# Patient Record
Sex: Female | Born: 1955 | Race: White | Hispanic: No | Marital: Married | Smoking: Never smoker
Health system: Southern US, Community
[De-identification: ages and names within clinical notes are randomized; demographics above are authoritative.]

## PROBLEM LIST (undated history)

## (undated) DIAGNOSIS — J45909 Unspecified asthma, uncomplicated: Secondary | ICD-10-CM

## (undated) DIAGNOSIS — E669 Obesity, unspecified: Secondary | ICD-10-CM

## (undated) DIAGNOSIS — I493 Ventricular premature depolarization: Secondary | ICD-10-CM

## (undated) DIAGNOSIS — L570 Actinic keratosis: Secondary | ICD-10-CM

## (undated) DIAGNOSIS — I5189 Other ill-defined heart diseases: Secondary | ICD-10-CM

## (undated) DIAGNOSIS — I471 Supraventricular tachycardia, unspecified: Secondary | ICD-10-CM

## (undated) DIAGNOSIS — E039 Hypothyroidism, unspecified: Secondary | ICD-10-CM

## (undated) DIAGNOSIS — M1990 Unspecified osteoarthritis, unspecified site: Secondary | ICD-10-CM

## (undated) DIAGNOSIS — E785 Hyperlipidemia, unspecified: Secondary | ICD-10-CM

## (undated) DIAGNOSIS — E119 Type 2 diabetes mellitus without complications: Secondary | ICD-10-CM

## (undated) DIAGNOSIS — D649 Anemia, unspecified: Secondary | ICD-10-CM

## (undated) DIAGNOSIS — T753XXA Motion sickness, initial encounter: Secondary | ICD-10-CM

## (undated) DIAGNOSIS — D259 Leiomyoma of uterus, unspecified: Secondary | ICD-10-CM

## (undated) DIAGNOSIS — I10 Essential (primary) hypertension: Secondary | ICD-10-CM

## (undated) DIAGNOSIS — K219 Gastro-esophageal reflux disease without esophagitis: Secondary | ICD-10-CM

## (undated) HISTORY — DX: Essential (primary) hypertension: I10

## (undated) HISTORY — DX: Actinic keratosis: L57.0

## (undated) HISTORY — DX: Other ill-defined heart diseases: I51.89

## (undated) HISTORY — DX: Supraventricular tachycardia, unspecified: I47.10

## (undated) HISTORY — DX: Anemia, unspecified: D64.9

## (undated) HISTORY — DX: Obesity, unspecified: E66.9

## (undated) HISTORY — DX: Type 2 diabetes mellitus without complications: E11.9

## (undated) HISTORY — DX: Ventricular premature depolarization: I49.3

## (undated) HISTORY — DX: Leiomyoma of uterus, unspecified: D25.9

## (undated) HISTORY — DX: Supraventricular tachycardia: I47.1

## (undated) HISTORY — DX: Hyperlipidemia, unspecified: E78.5

## (undated) HISTORY — PX: CARPAL TUNNEL RELEASE: SHX101

## (undated) HISTORY — PX: TONSILLECTOMY AND ADENOIDECTOMY: SUR1326

## (undated) HISTORY — DX: Hypothyroidism, unspecified: E03.9

## (undated) HISTORY — PX: TUBAL LIGATION: SHX77

---
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
## 2006-11-10 HISTORY — PX: CARPAL TUNNEL RELEASE: SHX101

## 2007-03-24 ENCOUNTER — Other Ambulatory Visit: Payer: Self-pay

## 2007-03-24 ENCOUNTER — Emergency Department: Payer: Self-pay | Admitting: Unknown Physician Specialty

## 2007-08-12 LAB — HM COLONOSCOPY: HM Colonoscopy: NORMAL

## 2008-10-23 ENCOUNTER — Ambulatory Visit: Payer: Self-pay | Admitting: Internal Medicine

## 2008-11-07 ENCOUNTER — Ambulatory Visit: Payer: Self-pay | Admitting: Internal Medicine

## 2009-08-03 ENCOUNTER — Emergency Department: Payer: Self-pay | Admitting: Emergency Medicine

## 2009-08-11 LAB — HM PAP SMEAR: HM Pap smear: NORMAL

## 2010-12-13 ENCOUNTER — Other Ambulatory Visit (HOSPITAL_COMMUNITY): Payer: Self-pay | Admitting: Surgery

## 2010-12-13 DIAGNOSIS — R1011 Right upper quadrant pain: Secondary | ICD-10-CM

## 2011-01-06 ENCOUNTER — Other Ambulatory Visit (HOSPITAL_COMMUNITY): Payer: Self-pay

## 2011-04-12 LAB — FECAL OCCULT BLOOD, GUAIAC: Fecal Occult Blood: NEGATIVE

## 2011-07-26 ENCOUNTER — Emergency Department: Payer: Self-pay | Admitting: Emergency Medicine

## 2011-08-01 ENCOUNTER — Encounter: Payer: Self-pay | Admitting: Nurse Practitioner

## 2011-08-01 ENCOUNTER — Telehealth: Payer: Self-pay | Admitting: Cardiology

## 2011-08-01 NOTE — Telephone Encounter (Signed)
Patient calling states she went to er on 9/15. C/O rapid heart rate. Pt saw Dr. Swaziland in the past.

## 2011-08-01 NOTE — Telephone Encounter (Signed)
Called stating she was in ER at [facility] last week for "rapid heart rate". They had to give her Adenosine and Metoprolol to get heart rate down. Her BP this week has been running 150/100; and HR 95-110. Feels bad; tired. Has had physicial w/ Dr. Clelia Croft recently and EKG was normal. Gave her an app to see [facility] Monday. She will call [facility] to see if she can get records ie; EKG and ER note faxed to [place].

## 2011-08-04 ENCOUNTER — Other Ambulatory Visit: Payer: Self-pay | Admitting: *Deleted

## 2011-08-04 ENCOUNTER — Encounter: Payer: Self-pay | Admitting: Nurse Practitioner

## 2011-08-04 ENCOUNTER — Ambulatory Visit (INDEPENDENT_AMBULATORY_CARE_PROVIDER_SITE_OTHER): Payer: BC Managed Care – PPO | Admitting: Nurse Practitioner

## 2011-08-04 ENCOUNTER — Telehealth: Payer: Self-pay | Admitting: Cardiology

## 2011-08-04 DIAGNOSIS — I10 Essential (primary) hypertension: Secondary | ICD-10-CM | POA: Insufficient documentation

## 2011-08-04 DIAGNOSIS — I471 Supraventricular tachycardia, unspecified: Secondary | ICD-10-CM | POA: Insufficient documentation

## 2011-08-04 DIAGNOSIS — E669 Obesity, unspecified: Secondary | ICD-10-CM

## 2011-08-04 DIAGNOSIS — I498 Other specified cardiac arrhythmias: Secondary | ICD-10-CM

## 2011-08-04 MED ORDER — HYDROCHLOROTHIAZIDE 25 MG PO TABS: 25.0000 mg | ORAL_TABLET | Freq: Every day | ORAL | Status: DC

## 2011-08-04 MED ORDER — METOPROLOL SUCCINATE ER 100 MG PO TB24: 100.0000 mg | ORAL_TABLET | Freq: Every day | ORAL | Status: DC

## 2011-08-04 NOTE — Progress Notes (Signed)
    Drue Stager Date of Birth: [birth year]   History of Present Illness: Amy Petty is seen today for a work in visit. She is seen for Dr. Swaziland. She has had a recurrent episode of SVT Saturday one week ago. This occurred in the late evening while watching TV. She felt a little breathless. No dizziness or syncope. No chest pain. She had had her physical several days before. Blood pressure has been up. She was started on very low dose HCTZ but her readings remain high. She went to the ER and was given adenosine x 2 with conversion back to sinus rhythm. She has had no recurrences and now feels ok. There was concern that her thyroid level was elevated but a recheck in Dr. Alver Fisher office showed it to be ok.   Current Outpatient Prescriptions on File Prior to Visit  Medication Sig Dispense Refill  . acetaminophen (TYLENOL) 650 MG CR tablet Take 650 mg by mouth every 8 (eight) hours as needed.        . Multiple Vitamin (MULTIVITAMIN) tablet Take 1 tablet by mouth daily.        . Multiple Vitamins-Minerals (HAIR/SKIN/NAILS PO) Take by mouth daily.        . naproxen (NAPROSYN) 500 MG tablet Take 500 mg by mouth as needed.        Marland Kitchen DISCONTD: metoprolol (TOPROL-XL) 50 MG 24 hr tablet Take 50 mg by mouth daily.        . hydrochlorothiazide (HYDRODIURIL) 25 MG tablet Take 1 tablet (25 mg total) by mouth daily.  30 tablet  6    Allergies  Allergen Reactions  . Penicillins   . Sulfonamide Derivatives     Past Medical History  Diagnosis Date  . Hypertension   . PVC's (premature ventricular contractions)   . SVT (supraventricular tachycardia)   . Obesity   . Hypothyroidism     Past Surgical History  Procedure Date  . Tubal ligation   . Carpal tunnel release   . Tonsillectomy and adenoidectomy     History  Smoking status  . Never Smoker   Smokeless tobacco  . Not on file    History  Alcohol Use No    Family History  Problem Relation Age of Onset  . Cancer Mother   . Lung cancer Mother     . Cancer Father   . Liver cancer Father   . Diabetes Sister     Review of Systems: The review of systems is positive for nosebleeds and elevated blood pressure.  All other systems were reviewed and are negative.  Physical Exam: BP 172/112  Pulse 80  Ht 5\' 9"  (1.753 m)  Wt 272 lb (123.378 kg)  BMI 40.17 kg/m2 Patient is very pleasant and in no acute distress. She is obese. Skin is warm and dry. Color is normal.  HEENT is unremarkable. Normocephalic/atraumatic. PERRL. Sclera are nonicteric. Neck is supple. No masses. No JVD. Lungs are clear. Cardiac exam shows a regular rate and rhythm. Abdomen is obese but soft. Extremities are without edema. Gait and ROM are intact. No gross neurologic deficits noted.  LABORATORY DATA: ER records from [place] reviewed. EKG's with SVT noted.    Assessment / Plan:

## 2011-08-04 NOTE — Assessment & Plan Note (Signed)
Blood pressure is up despite the low dose HCTZ. I have increased both the HCTZ to 25 mg and her metoprolol to 100 mg daily. She will continue to monitor at home. I cautioned her about rash since she does have a sulfa allergy but is tolerating so far. Will see back in 2 weeks. Check echo looking for LVH. Patient is agreeable to this plan and will call if any problems develop in the interim.

## 2011-08-04 NOTE — Telephone Encounter (Signed)
States she called [facility] to get records and they said our office would have to fax request. Sent request. Is scheduled to be seen this afternoon w/Lori.

## 2011-08-04 NOTE — Telephone Encounter (Signed)
Patient calling back with information . Fax  # [phone]. This should be a written request.

## 2011-08-04 NOTE — Patient Instructions (Signed)
Dr. Roxy Manns is at [facility] on [address]  We are going to get an ultrasound of your heart I want you to increase your HCTZ to 25 mg and your Metoprolol to 100 mg daily I will see you in 2 weeks Keep a check of your blood pressure  Minimize your salt

## 2011-08-04 NOTE — Assessment & Plan Note (Signed)
She has had a recurrent episode. This is her third one in the last couple of years. Beta blocker is increased. Echo will be ordered. May need to consider ablation for repeat episodes.

## 2011-08-11 ENCOUNTER — Ambulatory Visit (HOSPITAL_COMMUNITY): Payer: BC Managed Care – PPO | Attending: Cardiology | Admitting: Radiology

## 2011-08-11 DIAGNOSIS — I471 Supraventricular tachycardia, unspecified: Secondary | ICD-10-CM | POA: Insufficient documentation

## 2011-08-11 DIAGNOSIS — I10 Essential (primary) hypertension: Secondary | ICD-10-CM

## 2011-08-12 ENCOUNTER — Encounter: Payer: Self-pay | Admitting: *Deleted

## 2011-08-12 LAB — HM MAMMOGRAPHY: HM Mammogram: NORMAL

## 2011-08-13 ENCOUNTER — Telehealth: Payer: Self-pay | Admitting: *Deleted

## 2011-08-13 NOTE — Telephone Encounter (Signed)
Lm w/echo results. Will send copy to Dr. Clelia Croft

## 2011-08-13 NOTE — Telephone Encounter (Signed)
Message copied by Lorayne Bender on Wed Aug 13, 2011  5:38 PM ------      Message from: Rosalio Macadamia      Created: Tue Aug 12, 2011  2:07 PM       Ok to report. Echo is satisfactory.

## 2011-08-18 ENCOUNTER — Encounter: Payer: Self-pay | Admitting: Nurse Practitioner

## 2011-08-18 ENCOUNTER — Other Ambulatory Visit (INDEPENDENT_AMBULATORY_CARE_PROVIDER_SITE_OTHER): Payer: BC Managed Care – PPO | Admitting: *Deleted

## 2011-08-18 ENCOUNTER — Ambulatory Visit (INDEPENDENT_AMBULATORY_CARE_PROVIDER_SITE_OTHER): Payer: BC Managed Care – PPO | Admitting: Nurse Practitioner

## 2011-08-18 VITALS — BP 128/88 | HR 90 | Ht 70.0 in | Wt 270.1 lb

## 2011-08-18 DIAGNOSIS — I10 Essential (primary) hypertension: Secondary | ICD-10-CM

## 2011-08-18 DIAGNOSIS — I471 Supraventricular tachycardia: Secondary | ICD-10-CM

## 2011-08-18 DIAGNOSIS — E669 Obesity, unspecified: Secondary | ICD-10-CM

## 2011-08-18 DIAGNOSIS — I498 Other specified cardiac arrhythmias: Secondary | ICD-10-CM

## 2011-08-18 NOTE — Progress Notes (Signed)
    Drue Stager Date of Birth: 04-30-1956   History of Present Illness: Ms. Amy Petty is seen today for a follow up visit. It is a 2 week check. She is seen for Dr. Swaziland. She has had a recent episode of SVT. Blood pressure has been trending up. I increased her metoprolol and HCTZ 2 weeks ago. She is doing very well. She is tolerating her medicines. No chest pain or shortness of breath. Blood pressure has trended down nicely at home. No recurrent SVT. She is trying to arrange closer primary care follow up.   Current Outpatient Prescriptions on File Prior to Visit  Medication Sig Dispense Refill  . hydrochlorothiazide (HYDRODIURIL) 25 MG tablet Take 1 tablet (25 mg total) by mouth daily.  30 tablet  6  . metoprolol (TOPROL XL) 100 MG 24 hr tablet Take 1 tablet (100 mg total) by mouth daily.  30 tablet  11  . Multiple Vitamin (MULTIVITAMIN) tablet Take 1 tablet by mouth daily.        . Multiple Vitamins-Minerals (HAIR/SKIN/NAILS PO) Take by mouth daily.        . naproxen (NAPROSYN) 500 MG tablet Take 500 mg by mouth daily.         Allergies  Allergen Reactions  . Penicillins   . Sulfonamide Derivatives     Past Medical History  Diagnosis Date  . Hypertension   . PVC's (premature ventricular contractions)   . SVT (supraventricular tachycardia)   . Obesity   . Hypothyroidism   . Diastolic dysfunction     Grade 1 per echo Oct 2012; EF is normal.    Past Surgical History  Procedure Date  . Tubal ligation   . Carpal tunnel release   . Tonsillectomy and adenoidectomy     History  Smoking status  . Never Smoker   Smokeless tobacco  . Not on file    History  Alcohol Use No    Family History  Problem Relation Age of Onset  . Cancer Mother   . Lung cancer Mother   . Cancer Father   . Liver cancer Father   . Diabetes Sister     Review of Systems: The review of systems is positive for some knee pain.   All other systems were reviewed and are negative.  Physical Exam: BP  128/88  Pulse 90  Ht 5\' 10"  (1.778 m)  Wt 270 lb 1.9 oz (122.526 kg)  BMI 38.76 kg/m2 Patient is very pleasant and in no acute distress. She is obese. Skin is warm and dry. Color is normal.  HEENT is unremarkable. Normocephalic/atraumatic. PERRL. Sclera are nonicteric. Neck is supple. No masses. No JVD. Lungs are clear. Cardiac exam shows a regular rate and rhythm. Abdomen is obese but soft. Extremities are without edema. Gait and ROM are intact. No gross neurologic deficits noted.   LABORATORY DATA: BMET is pending   Assessment / Plan:

## 2011-08-18 NOTE — Patient Instructions (Signed)
Stay on your current medicines. Continue to monitor your blood pressure at home. I will see you in 6 months.  We will see what your lab looks like today.  Stay active. Think about water aerobics.  Call for any problems.

## 2011-08-18 NOTE — Assessment & Plan Note (Signed)
Weight loss is encouraged. I encouraged her to look into a water aerobic exercise class.

## 2011-08-18 NOTE — Assessment & Plan Note (Addendum)
Blood pressure has trended down nicely at home. Echo looks pretty good. Exercise/diet and weight loss are encouraged. We will continue with her current regimen. We will see her back in 6 months. BMET is checked today. She will continue to monitor her blood pressures at home. Patient is agreeable to this plan and will call if any problems develop in the interim.

## 2011-08-18 NOTE — Assessment & Plan Note (Signed)
No recurrence. 

## 2011-08-19 LAB — BASIC METABOLIC PANEL
BUN: 16 mg/dL (ref 6–23)
CO2: 31 mEq/L (ref 19–32)
Calcium: 9.6 mg/dL (ref 8.4–10.5)
Chloride: 100 mEq/L (ref 96–112)
Creatinine, Ser: 1 mg/dL (ref 0.4–1.2)
GFR: 63.5 mL/min (ref >60.00)
Glucose, Bld: 139 mg/dL — ABNORMAL HIGH (ref 70–99)
Potassium: 4.3 mEq/L (ref 3.5–5.1)
Sodium: 139 mEq/L (ref 135–145)

## 2011-08-20 ENCOUNTER — Telehealth: Payer: Self-pay | Admitting: Cardiology

## 2011-08-20 ENCOUNTER — Telehealth: Payer: Self-pay | Admitting: *Deleted

## 2011-08-20 NOTE — Telephone Encounter (Signed)
Pt was told she has a "stiff Heart"  She would like more info/  Please call her back and let her know.  She looked on Internet and got some info.  If no answer, just leave a message.

## 2011-08-20 NOTE — Telephone Encounter (Signed)
Lm w/lab results. Will send labs to Dr. Clelia Croft

## 2011-08-20 NOTE — Telephone Encounter (Signed)
Message copied by Lorayne Bender on Wed Aug 20, 2011 10:18 AM ------      Message from: Rosalio Macadamia      Created: Tue Aug 19, 2011 11:51 AM       Ok to report. Labs are satisfactory. This was nonfasting. Continue with current medicines.

## 2011-08-21 NOTE — Telephone Encounter (Signed)
Per Amy Petty advised that she has dystolic dysfunction grade 1; diet and exercise will help. Pt understands

## 2011-09-19 ENCOUNTER — Ambulatory Visit: Payer: BC Managed Care – PPO | Admitting: Internal Medicine

## 2011-10-15 ENCOUNTER — Ambulatory Visit: Payer: BC Managed Care – PPO | Admitting: Internal Medicine

## 2011-10-20 ENCOUNTER — Encounter: Payer: Self-pay | Admitting: Internal Medicine

## 2011-10-20 ENCOUNTER — Ambulatory Visit (INDEPENDENT_AMBULATORY_CARE_PROVIDER_SITE_OTHER): Payer: BC Managed Care – PPO | Admitting: Internal Medicine

## 2011-10-20 DIAGNOSIS — J302 Other seasonal allergic rhinitis: Secondary | ICD-10-CM

## 2011-10-20 DIAGNOSIS — I471 Supraventricular tachycardia, unspecified: Secondary | ICD-10-CM

## 2011-10-20 DIAGNOSIS — I498 Other specified cardiac arrhythmias: Secondary | ICD-10-CM

## 2011-10-20 DIAGNOSIS — J309 Allergic rhinitis, unspecified: Secondary | ICD-10-CM | POA: Insufficient documentation

## 2011-10-20 DIAGNOSIS — Z1211 Encounter for screening for malignant neoplasm of colon: Secondary | ICD-10-CM

## 2011-10-20 DIAGNOSIS — Z79899 Other long term (current) drug therapy: Secondary | ICD-10-CM

## 2011-10-20 DIAGNOSIS — Z124 Encounter for screening for malignant neoplasm of cervix: Secondary | ICD-10-CM

## 2011-10-20 DIAGNOSIS — D259 Leiomyoma of uterus, unspecified: Secondary | ICD-10-CM | POA: Insufficient documentation

## 2011-10-20 DIAGNOSIS — Z1239 Encounter for other screening for malignant neoplasm of breast: Secondary | ICD-10-CM

## 2011-10-20 DIAGNOSIS — D649 Anemia, unspecified: Secondary | ICD-10-CM

## 2011-10-20 DIAGNOSIS — E039 Hypothyroidism, unspecified: Secondary | ICD-10-CM

## 2011-10-20 MED ORDER — METOPROLOL SUCCINATE ER 100 MG PO TB24: 100.0000 mg | ORAL_TABLET | Freq: Every day | ORAL | Status: DC

## 2011-10-20 MED ORDER — HYDROCHLOROTHIAZIDE 25 MG PO TABS: 25.0000 mg | ORAL_TABLET | Freq: Every day | ORAL | Status: DC

## 2011-10-20 MED ORDER — MOMETASONE FUROATE 50 MCG/ACT NA SUSP: 2.0000 | Freq: Every day | NASAL | Status: DC

## 2011-10-20 MED ORDER — NAPROXEN 500 MG PO TABS: 500.0000 mg | ORAL_TABLET | Freq: Every day | ORAL | Status: DC

## 2011-10-20 NOTE — Progress Notes (Signed)
Subjective:    Patient ID: Amy Petty, female    DOB: 04/05/1956, 55 y.o.   MRN: [id number]  HPI  Amy Petty is a 55 yo white female with a history of allergic rhinitis and hypertension who presents to establish care. She does not treat her allergic rhinitis regularly and as a result has two episodes of bronchitis/sinusitis  annually which usually start with sinus congestion and cough.  She has not had her her episode yet this Fall.  Past Medical History  Diagnosis Date  . Hypertension   . PVC's (premature ventricular contractions)   . SVT (supraventricular tachycardia)   . Obesity   . Hypothyroidism   . Diastolic dysfunction     Grade 1 per echo Oct 2012; EF is normal.  . Fibroid, uterus     with menorrhagia  . Anemia     iron deficiency, due to fibroids   Current Outpatient Prescriptions on File Prior to Visit  Medication Sig Dispense Refill  . Multiple Vitamin (MULTIVITAMIN) tablet Take 1 tablet by mouth daily.        . Multiple Vitamins-Minerals (HAIR/SKIN/NAILS PO) Take by mouth daily.        Marland Kitchen DISCONTD: hydrochlorothiazide (HYDRODIURIL) 25 MG tablet Take 1 tablet (25 mg total) by mouth daily.  30 tablet  6  . DISCONTD: metoprolol (TOPROL XL) 100 MG 24 hr tablet Take 1 tablet (100 mg total) by mouth daily.  30 tablet  11  . DISCONTD: naproxen (NAPROSYN) 500 MG tablet Take 500 mg by mouth daily.         Review of Systems  Constitutional: Negative for fever, chills and unexpected weight change.  HENT: Negative for hearing loss, ear pain, nosebleeds, congestion, sore throat, facial swelling, rhinorrhea, sneezing, mouth sores, trouble swallowing, neck pain, neck stiffness, voice change, postnasal drip, sinus pressure, tinnitus and ear discharge.   Eyes: Negative for pain, discharge, redness and visual disturbance.  Respiratory: Negative for cough, chest tightness, shortness of breath, wheezing and stridor.   Cardiovascular: Negative for chest pain, palpitations and leg swelling.    Musculoskeletal: Negative for myalgias and arthralgias.  Skin: Negative for color change and rash.  Neurological: Negative for dizziness, weakness, light-headedness and headaches.  Hematological: Negative for adenopathy.   BP 128/74  Pulse 85  Temp(Src) 97.9 F (36.6 C) (Oral)  Resp 16  Ht 5' 8.5" (1.74 m)  Wt 269 lb 8 oz (122.244 kg)  BMI 40.38 kg/m2  SpO2 96%  LMP 10/19/2011     Objective:   Physical Exam  Constitutional: She is oriented to person, place, and time. She appears well-developed and well-nourished.  HENT:  Mouth/Throat: Oropharynx is clear and moist.  Eyes: EOM are normal. Pupils are equal, round, and reactive to light. No scleral icterus.  Neck: Normal range of motion. Neck supple. No JVD present. No thyromegaly present.  Cardiovascular: Normal rate, regular rhythm, normal heart sounds and intact distal pulses.   Pulmonary/Chest: Effort normal and breath sounds normal.  Abdominal: Soft. Bowel sounds are normal. She exhibits no mass. There is no tenderness.  Musculoskeletal: Normal range of motion. She exhibits no edema.  Lymphadenopathy:    She has no cervical adenopathy.  Neurological: She is alert and oriented to person, place, and time.  Skin: Skin is warm and dry.  Psychiatric: She has a normal mood and affect.      Assessment & Plan:   SVT (supraventricular tachycardia) Last  Episode was a few months ago., requiring ER visit and IV meds.  Thyroid appeared underactive but on recheck was normal.   Screening for cervical cancer She will be due for PAP in 6 months  Screening for colon cancer screened with normal colonoscopy at age 88.  Will start annual FOBTs next year.   Screening for breast cancer Normal mammogram [facility] ,  ordered by prior PCP Amy Petty   Anemia iron deficiency,secondary to menorrhagia  due to fibroids.  She takes iron supplements only during the week of her menses..  Will recheck iron stores and hgb prior to next visit.      Updated Medication List Outpatient Encounter Prescriptions as of 10/20/2011  Medication Sig Dispense Refill  . ferrous fumarate (HEMOCYTE - 106 MG FE) 325 (106 FE) MG TABS Take 1 tablet by mouth.        . hydrochlorothiazide (HYDRODIURIL) 25 MG tablet Take 1 tablet (25 mg total) by mouth daily.  90 tablet  3  . metoprolol (TOPROL XL) 100 MG 24 hr tablet Take 1 tablet (100 mg total) by mouth daily.  90 tablet  3  . Multiple Vitamin (MULTIVITAMIN) tablet Take 1 tablet by mouth daily.        . Multiple Vitamins-Minerals (HAIR/SKIN/NAILS PO) Take by mouth daily.        . naproxen (NAPROSYN) 500 MG tablet Take 1 tablet (500 mg total) by mouth daily.  90 tablet  3  . pseudoephedrine-guaifenesin (MUCINEX D) 60-600 MG per tablet Take 1 tablet by mouth every 12 (twelve) hours.        Marland Kitchen DISCONTD: hydrochlorothiazide (HYDRODIURIL) 25 MG tablet Take 1 tablet (25 mg total) by mouth daily.  30 tablet  6  . DISCONTD: hydrochlorothiazide (HYDRODIURIL) 25 MG tablet Take 1 tablet (25 mg total) by mouth daily.  30 tablet  6  . DISCONTD: metoprolol (TOPROL XL) 100 MG 24 hr tablet Take 1 tablet (100 mg total) by mouth daily.  30 tablet  11  . DISCONTD: metoprolol (TOPROL XL) 100 MG 24 hr tablet Take 1 tablet (100 mg total) by mouth daily.  30 tablet  6  . DISCONTD: naproxen (NAPROSYN) 500 MG tablet Take 500 mg by mouth daily.       . mometasone (NASONEX) 50 MCG/ACT nasal spray Place 2 sprays into the nose daily.  17 g  2

## 2011-10-20 NOTE — Assessment & Plan Note (Signed)
Last  Episode was a few months ago., requiring ER visit and IV meds.  Thyroid appeared underactive but on recheck was normal.

## 2011-10-20 NOTE — Assessment & Plan Note (Addendum)
iron deficiency,secondary to menorrhagia  due to fibroids.  She takes iron supplements only during the week of her menses..  Will recheck iron stores and hgb prior to next visit.

## 2011-10-20 NOTE — Assessment & Plan Note (Signed)
She will be due for PAP in 6 months

## 2011-10-20 NOTE — Assessment & Plan Note (Signed)
Recommended trial of steroid nasal spray to avoid recurrent sinusitis/bronchitis precipitated by congestion

## 2011-10-20 NOTE — Assessment & Plan Note (Signed)
Normal mammogram  [facility] ,  ordered by prior PCP Sherryll Burger

## 2011-10-20 NOTE — Assessment & Plan Note (Signed)
screened with normal colonoscopy at age 55.  Will start annual FOBTs next year.

## 2011-11-26 ENCOUNTER — Telehealth: Payer: Self-pay | Admitting: Internal Medicine

## 2011-11-26 NOTE — Telephone Encounter (Signed)
Advised pt.  Advised her to call back if this happens again.

## 2011-11-26 NOTE — Telephone Encounter (Signed)
Patient has been on her menstrual cycle since Sun.She woke up yesterday morning experiencing pain on her left with this pain she would vomit ,after she vomits  the pain would go away. She had this episode twice yesterday morning she isn't having the pain any more just sore from vomiting yesterday.

## 2011-11-26 NOTE — Telephone Encounter (Signed)
Pt has had pain in lower left side since yesterday morning.   This was shooting pains.  No pain today, as note below says, but still sore from vomiting.  No fever, no diarrhea.  She wonders if she could have had a cyst that ruptured.

## 2011-11-26 NOTE — Telephone Encounter (Signed)
Yes it is possible that she had a cyst that ruptured especially if the pain has now resolved

## 2012-03-05 ENCOUNTER — Encounter: Payer: Self-pay | Admitting: Internal Medicine

## 2012-03-05 ENCOUNTER — Ambulatory Visit (INDEPENDENT_AMBULATORY_CARE_PROVIDER_SITE_OTHER)
Admission: RE | Admit: 2012-03-05 | Discharge: 2012-03-05 | Disposition: A | Discharge status: Home / Self Care | Payer: BC Managed Care – PPO | Source: Ambulatory Visit | Attending: Internal Medicine | Admitting: Internal Medicine

## 2012-03-05 ENCOUNTER — Telehealth: Payer: Self-pay | Admitting: Internal Medicine

## 2012-03-05 ENCOUNTER — Ambulatory Visit (INDEPENDENT_AMBULATORY_CARE_PROVIDER_SITE_OTHER): Payer: BC Managed Care – PPO | Admitting: Internal Medicine

## 2012-03-05 VITALS — BP 114/72 | HR 71 | Ht 68.5 in | Wt 257.0 lb

## 2012-03-05 DIAGNOSIS — M25571 Pain in right ankle and joints of right foot: Secondary | ICD-10-CM

## 2012-03-05 DIAGNOSIS — M7661 Achilles tendinitis, right leg: Secondary | ICD-10-CM | POA: Insufficient documentation

## 2012-03-05 DIAGNOSIS — M25579 Pain in unspecified ankle and joints of unspecified foot: Secondary | ICD-10-CM

## 2012-03-05 LAB — COMPREHENSIVE METABOLIC PANEL
AST: 19 U/L (ref 0–37)
BUN: 19 mg/dL (ref 6–23)
Calcium: 9.7 mg/dL (ref 8.4–10.5)
Chloride: 101 mEq/L (ref 96–112)
Creatinine, Ser: 0.7 mg/dL (ref 0.4–1.2)
Total Bilirubin: 0.5 mg/dL (ref 0.3–1.2)

## 2012-03-05 NOTE — Telephone Encounter (Signed)
Caller: [facility]/Patient; PCP: Duncan Dull; CB#: [phone]; Call regarding R Heel Pain; Onset 1 yr; Burning pain posterior heel > in past month d/t > walking.   Pain rated 9/10 at night and 4/10 during day. Afebrile. No known injury. Pain > when palpated.  Pain lessened with Naproxyn. LMP 2/end/13.  Advised to see MD within 24 hrs for new onset of moderate pain that has not improved with 24 hrs of home care per Foot Non-Injury Guideline. Dr Darrick Huntsman has no appts remaining.  Declined appt with Dr Dan Humphreys at 1030 d/t work .  Requests appt after 1230.  Office nurse/Stephanie approved scheduled at 1315 with Dr Dan Humphreys.

## 2012-03-05 NOTE — Progress Notes (Signed)
  Subjective:    Patient ID: Amy Petty, female    DOB: June 11, 1956, 56 y.o.   MRN: [id number]  HPI 56 year old female presents for acute visit complaining of right medial ankle pain. This pain has been present for approximately one year. It has recently worsen. It is described as focal pain over the right medial ankle. It is not consistent with patient's previous history of plantar fasciitis. Patient reports pain with minimal pressure over the area. She has been unable to wear her tennis shoes. She denies any known injury to this site. She is not currently taking any medication for pain.  Outpatient Encounter Prescriptions as of 03/05/2012  Medication Sig Dispense Refill  . ferrous fumarate (HEMOCYTE - 106 MG FE) 325 (106 FE) MG TABS Take 1 tablet by mouth. With menses      . hydrochlorothiazide (HYDRODIURIL) 25 MG tablet Take 1 tablet (25 mg total) by mouth daily.  90 tablet  3  . metoprolol (TOPROL XL) 100 MG 24 hr tablet Take 1 tablet (100 mg total) by mouth daily.  90 tablet  3  . mometasone (NASONEX) 50 MCG/ACT nasal spray Place 2 sprays into the nose daily.  17 g  2  . Multiple Vitamin (MULTIVITAMIN) tablet Take 1 tablet by mouth daily.        . Multiple Vitamins-Minerals (HAIR/SKIN/NAILS PO) Take by mouth daily.        . naproxen (NAPROSYN) 500 MG tablet Take 1 tablet (500 mg total) by mouth daily.  90 tablet  3  . pseudoephedrine-guaifenesin (MUCINEX D) 60-600 MG per tablet Take 1 tablet by mouth every 12 (twelve) hours.         BP 114/72  Pulse 71  Ht 5' 8.5" (1.74 m)  Wt 257 lb (116.574 kg)  BMI 38.51 kg/m2  LMP 01/16/2012  Review of Systems  Constitutional: Negative for fever and chills.  Respiratory: Negative for shortness of breath.   Cardiovascular: Negative for chest pain.  Musculoskeletal: Positive for arthralgias.       Objective:   Physical Exam  Constitutional: She appears well-developed and well-nourished. No distress.  HENT:  Head: Normocephalic and atraumatic.    Eyes: EOM are normal.  Neck: Normal range of motion.  Pulmonary/Chest: Effort normal.  Musculoskeletal:       Right ankle: She exhibits normal range of motion, no swelling, no ecchymosis and no deformity. Achilles tendon normal.       Feet:  Skin: She is not diaphoretic.          Assessment & Plan:

## 2012-03-05 NOTE — Assessment & Plan Note (Signed)
Unclear etiology of right ankle pain. Symptoms are not consistent with plantar fasciitis or other common causes of ankle pain such as tendonitis. Given that pain is so focal and superficial, question if she may have gout. She is on hydrochlorothiazide to have some risk for this. Will send CMP and uric acid. Will get plain x-ray of the ankle to evaluate for trauma or possible foreign body. If x-ray and labs are normal, will set up with orthopedics for evaluation.

## 2012-03-08 ENCOUNTER — Telehealth: Payer: Self-pay | Admitting: *Deleted

## 2012-03-08 ENCOUNTER — Telehealth: Payer: Self-pay | Admitting: Internal Medicine

## 2012-03-08 NOTE — Telephone Encounter (Signed)
LMOM to inform patient/SLS 

## 2012-03-08 NOTE — Telephone Encounter (Signed)
Message copied by Regis Bill on Mon Mar 08, 2012  8:35 AM ------      Message from: Ronna Polio A      Created: Fri Mar 05, 2012  5:14 PM       Labs including uric acid was normal.

## 2012-03-08 NOTE — Telephone Encounter (Signed)
APPOINTMENT WITH DR [facility] ortho  [phone]   Thursday 03/11/12 @ 3:30 Pt aware of appointmnet

## 2012-04-19 ENCOUNTER — Encounter: Payer: Self-pay | Admitting: Internal Medicine

## 2012-04-19 ENCOUNTER — Ambulatory Visit (INDEPENDENT_AMBULATORY_CARE_PROVIDER_SITE_OTHER): Payer: BC Managed Care – PPO | Admitting: Internal Medicine

## 2012-04-19 VITALS — BP 126/74 | HR 64 | Temp 98.0°F | Resp 16 | Wt 260.0 lb

## 2012-04-19 DIAGNOSIS — N951 Menopausal and female climacteric states: Secondary | ICD-10-CM

## 2012-04-19 DIAGNOSIS — E669 Obesity, unspecified: Secondary | ICD-10-CM

## 2012-04-19 DIAGNOSIS — M7661 Achilles tendinitis, right leg: Secondary | ICD-10-CM

## 2012-04-19 DIAGNOSIS — D649 Anemia, unspecified: Secondary | ICD-10-CM

## 2012-04-19 DIAGNOSIS — Z79899 Other long term (current) drug therapy: Secondary | ICD-10-CM

## 2012-04-19 DIAGNOSIS — M7660 Achilles tendinitis, unspecified leg: Secondary | ICD-10-CM

## 2012-04-19 DIAGNOSIS — N920 Excessive and frequent menstruation with regular cycle: Secondary | ICD-10-CM

## 2012-04-19 DIAGNOSIS — I10 Essential (primary) hypertension: Secondary | ICD-10-CM

## 2012-04-19 LAB — COMPREHENSIVE METABOLIC PANEL
Albumin: 3.7 g/dL (ref 3.5–5.2)
Alkaline Phosphatase: 80 U/L (ref 39–117)
BUN: 19 mg/dL (ref 6–23)
CO2: 28 mEq/L (ref 19–32)
GFR: 106.17 mL/min (ref >60.00)
Glucose, Bld: 81 mg/dL (ref 70–99)
Potassium: 4.3 mEq/L (ref 3.5–5.1)
Total Protein: 7.2 g/dL (ref 6.0–8.3)

## 2012-04-19 LAB — CBC WITH DIFFERENTIAL/PLATELET
Eosinophils Absolute: 0.1 10*3/uL (ref 0.0–0.7)
HCT: 36.8 % (ref 36.0–46.0)
Lymphs Abs: 2.5 10*3/uL (ref 0.7–4.0)
MCHC: 32.3 g/dL (ref 30.0–36.0)
MCV: 88.7 fl (ref 78.0–100.0)
Monocytes Absolute: 0.4 10*3/uL (ref 0.1–1.0)
Neutrophils Relative %: 57.7 % (ref 43.0–77.0)
Platelets: 281 10*3/uL (ref 150.0–400.0)

## 2012-04-19 LAB — FOLLICLE STIMULATING HORMONE: FSH: 15 m[IU]/mL

## 2012-04-19 MED ORDER — METOPROLOL SUCCINATE ER 100 MG PO TB24: 100.0000 mg | ORAL_TABLET | Freq: Every day | ORAL | Status: DC

## 2012-04-19 MED ORDER — TRAMADOL HCL 50 MG PO TABS: 50.0000 mg | ORAL_TABLET | Freq: Three times a day (TID) | ORAL | Status: AC | PRN

## 2012-04-19 MED ORDER — HYDROCHLOROTHIAZIDE 25 MG PO TABS: 25.0000 mg | ORAL_TABLET | Freq: Every day | ORAL | Status: DC

## 2012-04-19 NOTE — Assessment & Plan Note (Addendum)
Continue Atkins protein bars, EAS shakes, weight watchers program.  Exericise alternatives to walking given.  She has lost  12 lb since sept

## 2012-04-19 NOTE — Assessment & Plan Note (Signed)
Secondary to menorrhagia.,  No longer taking irone,.  Needs repeat cbcs

## 2012-04-19 NOTE — Progress Notes (Signed)
Patient ID: Amy Petty, female   DOB: 15-Jun-1956, 56 y.o.   MRN: [id number]  Patient Active Problem List  Diagnoses  . SVT (supraventricular tachycardia)  . HTN (hypertension)  . Obesity  . Fibroid, uterus  . Anemia  . Screening for cervical cancer  . Screening for breast cancer  . Screening for colon cancer  . Allergic rhinitis  . Tendonitis, Achilles, right  . Peri-menopause  . Menorrhagia    Subjective:  CC:   Chief Complaint  Patient presents with  . Follow-up    6 month    HPI:   Amy Petty a 56 y.o. female who presents for follow up on chronic and acute issues.  She has severe achilles tendonitis diagnosed by Orthopedics  (Dr. Rayburn Ma ,  [facility], who treated her right wrist with carpal tunnel release previously) after 4/26 evaluation by Dr. Dan Humphreys.  She has had no appreciable improvement with  PT and has been applying  topical voltaren for the past 3 weeks. The next treatment plan is an intraligamentous injection and immobilization in a boot.  The precipitant was increased walking for exercise.  She has been steadily losing weight through diet and exercise until this injury occurred.  2nd issue is chronic menorrhagia.  She continues to have heavy periods but they are now occurring less frequently several months apart, without symptoms such as hot flashes.   Past Medical History  Diagnosis Date  . Hypertension   . PVC's (premature ventricular contractions)   . SVT (supraventricular tachycardia)   . Obesity   . Hypothyroidism   . Diastolic dysfunction     Grade 1 per echo Oct 2012; EF is normal.  . Fibroid, uterus     with menorrhagia  . Anemia     iron deficiency, due to fibroids    Past Surgical History  Procedure Date  . Tubal ligation   . Carpal tunnel release   . Tonsillectomy and adenoidectomy   . Carpal tunnel release 2008    right hand. wears  brace on left         The following portions of the patient's history were reviewed and  updated as appropriate: Allergies, current medications, and problem list.    Review of Systems:   12 Pt  review of systems was negative except those addressed in the HPI,     History   Social History  . Marital Status: Married    Spouse Name: N/A    Number of Children: N/A  . Years of Education: N/A   Occupational History  . Not on file.   Social History Main Topics  . Smoking status: Never Smoker   . Smokeless tobacco: Never Used  . Alcohol Use: No  . Drug Use: No  . Sexually Active: Yes   Other Topics Concern  . Not on file   Social History Narrative  . No narrative on file    Objective:  BP 126/74  Pulse 64  Temp(Src) 98 F (36.7 C) (Oral)  Resp 16  Wt 260 lb (117.935 kg)  SpO2 95%  LMP 03/29/2012  General appearance: alert, cooperative and appears stated age Ears: normal TM's and external ear canals both ears Throat: lips, mucosa, and tongue normal; teeth and gums normal Neck: no adenopathy, no carotid bruit, supple, symmetrical, trachea midline and thyroid not enlarged, symmetric, no tenderness/mass/nodules Back: symmetric, no curvature. ROM normal. No CVA tenderness. Lungs: clear to auscultation bilaterally Heart: regular rate and rhythm, S1, S2 normal, no murmur, click,  rub or gallop Abdomen: soft, non-tender; bowel sounds normal; no masses,  no organomegaly Pulses: 2+ and symmetric Skin: Skin color, texture, turgor normal. No rashes or lesions Lymph nodes: Cervical, supraclavicular, and axillary nodes normal.  Assessment and Plan:  Anemia Secondary to menorrhagia.,  No longer taking irone,.  Needs repeat cbcs   Obesity Continue Atkins protein bars, EAS shakes, weight watchers program.  Exericise alternatives to walking given.  She has lost  12 lb since sept   Menorrhagia Chronic, improving as she is becoming perimenopausal.  Checking  iron stores and FSH/LH  Tendonitis, Achilles, right Secondary to increased walking program.  Not  responsive to PT and topical voltaren. Awaiting steroid injection as next course of treatment.  Recommended walking in swimming pool or water aerobics for exercise. Adding tramadol for pain control .  HTN (hypertension) Well controlled on current medications.  No changes today.    Updated Medication List Outpatient Encounter Prescriptions as of 04/19/2012  Medication Sig Dispense Refill  . hydrochlorothiazide (HYDRODIURIL) 25 MG tablet Take 1 tablet (25 mg total) by mouth daily.  90 tablet  3  . metoprolol succinate (TOPROL XL) 100 MG 24 hr tablet Take 1 tablet (100 mg total) by mouth daily.  90 tablet  3  . Multiple Vitamin (MULTIVITAMIN) tablet Take 1 tablet by mouth daily.        . Multiple Vitamins-Minerals (HAIR/SKIN/NAILS PO) Take by mouth daily.        . naproxen (NAPROSYN) 500 MG tablet Take 1 tablet (500 mg total) by mouth daily.  90 tablet  3  . DISCONTD: hydrochlorothiazide (HYDRODIURIL) 25 MG tablet Take 1 tablet (25 mg total) by mouth daily.  90 tablet  3  . DISCONTD: metoprolol (TOPROL XL) 100 MG 24 hr tablet Take 1 tablet (100 mg total) by mouth daily.  90 tablet  3  . mometasone (NASONEX) 50 MCG/ACT nasal spray Place 2 sprays into the nose daily.  17 g  2  . pseudoephedrine-guaifenesin (MUCINEX D) 60-600 MG per tablet Take 1 tablet by mouth every 12 (twelve) hours.        . traMADol (ULTRAM) 50 MG tablet Take 1 tablet (50 mg total) by mouth every 8 (eight) hours as needed for pain.  90 tablet  3  . DISCONTD: ferrous fumarate (HEMOCYTE - 106 MG FE) 325 (106 FE) MG TABS Take 1 tablet by mouth. With menses         Orders Placed This Encounter  Procedures  . Follicle stimulating hormone  . Luteinizing hormone    No Follow-up on file.

## 2012-04-19 NOTE — Assessment & Plan Note (Signed)
Secondary to increased walking program.  Not responsive to PT and topical voltaren. Awaiting steroid injection as next course of treatment.  Recommended walking in swimming pool or water aerobics for exercise. Adding tramadol for pain control .

## 2012-04-19 NOTE — Assessment & Plan Note (Signed)
Well controlled on current medications.  No changes today. 

## 2012-04-19 NOTE — Patient Instructions (Signed)
You may use tramadol with tylenol and with your topical voltaren bc it is a pure pain reliever. Not an ani inflammatory.   Consider walking in the pool for exercise that will stress your achilles.   We will check nonfasting labs today   .Consider the Low Glycemic Index Diet and 6 smaller meals daily .  This boosts your metabolism and regulates your sugars: ----------------------------------------------------------------------------------------------------------------------------------------------------------------------------------  7 AM Low carbohydrate Protein  Shakes (EAS Carb Control  Or Atkins ,  Available everywhere,   In  cases at [employer] )  2.5 carbs  (Add or substitute a toasted sandwhich thin w/ peanut butter)  10 AM: Protein bar by Atkins (snack size,  Chocolate lover's variety at  [employer])    Lunch: sandwich on pita bread or flatbread (Joseph's makes a pita bread and a flat bread , available at [employer] and [employer]; Toufayah makes a low carb flatbread available at [employer] and [employer]) Mission makes a low carb whole wheat tortilla available at [employer] most grocery stores   3 PM:  Mid day :  Another protein bar,  Or a  cheese stick, 1/4 cup of almonds, walnuts, pistachios, pecans, peanuts,  Macadamia nuts  6 PM  Dinner:  "mean and green:"  Meat/chicken/fish, salad, and green veggie : use ranch, vinagrette,  Blue cheese, etc  9 PM snack : Breyer's low carb fudgsicle or  ice cream bar (Carb Smart), or  Weight Watcher's ice cream bar , or another protein shake

## 2012-04-19 NOTE — Assessment & Plan Note (Signed)
Chronic, improving as she is becoming perimenopausal.  Checking  iron stores and FSH/LH

## 2012-04-20 LAB — IRON AND TIBC
Iron: 56 ug/dL (ref 42–145)
UIBC: 340 ug/dL (ref 125–400)

## 2012-04-22 ENCOUNTER — Other Ambulatory Visit (INDEPENDENT_AMBULATORY_CARE_PROVIDER_SITE_OTHER): Payer: BC Managed Care – PPO | Admitting: *Deleted

## 2012-04-22 ENCOUNTER — Telehealth: Payer: Self-pay | Admitting: Internal Medicine

## 2012-04-22 DIAGNOSIS — N390 Urinary tract infection, site not specified: Secondary | ICD-10-CM

## 2012-04-22 NOTE — Telephone Encounter (Signed)
Tried to review the UA but I can't pull it up.  can you call her in ciprofloxacin 250 mg one tablet twice daily  #10 no refills

## 2012-04-22 NOTE — Telephone Encounter (Signed)
Amy Petty  Provider- Darrick Huntsman;  Pt c/o of urinary frequency and some burning. Onset- 04/20/12 Afebrile. Emergent s/s of Urinary s/s protocol r/o. Pt to see provider within 24 hrs. No appts available and pt states she was just in office on Monday. Per Zella Ball pt can come by to leave a urine sample, call transferred to office.

## 2012-04-23 ENCOUNTER — Other Ambulatory Visit: Payer: Self-pay | Admitting: Internal Medicine

## 2012-04-23 MED ORDER — CIPROFLOXACIN HCL 250 MG PO TABS: 250.0000 mg | ORAL_TABLET | Freq: Two times a day (BID) | ORAL | Status: AC

## 2012-04-23 NOTE — Telephone Encounter (Signed)
Patient notified, Rx called in. 

## 2012-04-24 LAB — URINALYSIS
Bilirubin Urine: NEGATIVE
Glucose, UA: NEGATIVE mg/dL
Hgb urine dipstick: NEGATIVE
Ketones, ur: NEGATIVE mg/dL
Leukocytes, UA: NEGATIVE
Protein, ur: NEGATIVE mg/dL
pH: 5.5 (ref 5.0–8.0)

## 2012-04-25 LAB — URINE CULTURE: Colony Count: 75000

## 2012-05-21 ENCOUNTER — Ambulatory Visit (INDEPENDENT_AMBULATORY_CARE_PROVIDER_SITE_OTHER): Payer: BC Managed Care – PPO | Admitting: Internal Medicine

## 2012-05-21 ENCOUNTER — Telehealth: Payer: Self-pay | Admitting: Internal Medicine

## 2012-05-21 DIAGNOSIS — R300 Dysuria: Secondary | ICD-10-CM

## 2012-05-21 DIAGNOSIS — N390 Urinary tract infection, site not specified: Secondary | ICD-10-CM

## 2012-05-21 LAB — POCT URINALYSIS DIPSTICK
Glucose, UA: NEGATIVE
Leukocytes, UA: NEGATIVE
Nitrite, UA: NEGATIVE
Spec Grav, UA: 1.015
Urobilinogen, UA: 0.2

## 2012-05-21 NOTE — Telephone Encounter (Signed)
Caller: Amy Petty/Patient; PCP: Duncan Dull; CB#: [phone]; ; ; Call regarding Urinary Pain/Bleeding; Onset 05/21/12 am began having burning w/ urination, sharp pains in lower back and side, frequency, urgency. Afebrile. Seen in office for u/a which was neg. Still with low back pain, dysuria and urgency. Drinking plenty of flds, water and orange juice. All emergent s/s r/o per Flank Pain protocol with the exception of "Flank pain or low back pain and urinary tract symptoms" Care advice given. RN instructed to be evaluated in UC within 4 hours.

## 2012-05-21 NOTE — Telephone Encounter (Signed)
Caller: Kelena/Patient; PCP: Duncan Dull; CB#: [phone] or [phone]; Call regarding back pain; sx started 05/21/12; and feels like she has a bladder infection; having urgency sensation; burning with urination; no fever; Triaged per Urinary Symptoms-Female Guideline; See in 4 hr d/t urinary tract symptoms and low back pain; all appt full; office called; appt made for 2:30pm today; will comply

## 2012-05-21 NOTE — Telephone Encounter (Signed)
Will address during appt today.

## 2012-05-23 ENCOUNTER — Encounter: Payer: Self-pay | Admitting: Internal Medicine

## 2012-05-23 DIAGNOSIS — R300 Dysuria: Secondary | ICD-10-CM | POA: Insufficient documentation

## 2012-05-23 LAB — URINE CULTURE: Colony Count: 10000

## 2012-05-23 NOTE — Assessment & Plan Note (Signed)
Her UA and culture were negative for signs of infection or inflammation. She later returned for pelvic evaluation.

## 2012-05-23 NOTE — Progress Notes (Signed)
Patient ID: Amy Petty, female   DOB: 1956/04/16, 56 y.o.   MRN: [id number]  Patient Active Problem List  Diagnosis  . SVT (supraventricular tachycardia)  . HTN (hypertension)  . Obesity  . Fibroid, uterus  . Anemia  . Screening for cervical cancer  . Screening for breast cancer  . Screening for colon cancer  . Allergic rhinitis  . Tendonitis, Achilles, right  . Peri-menopause  . Menorrhagia  . Dysuria    Subjective:  CC:   No chief complaint on file.   HPI:   Amy Petty a 56 y.o. female who presents to drop off a urine sample due to symptoms of low back pain frequency and burning. She did not stay for M.D. evaluation. Urinalysis was reviewed.   Past Medical History  Diagnosis Date  . Hypertension   . PVC's (premature ventricular contractions)   . SVT (supraventricular tachycardia)   . Obesity   . Hypothyroidism   . Diastolic dysfunction     Grade 1 per echo Oct 2012; EF is normal.  . Fibroid, uterus     with menorrhagia  . Anemia     iron deficiency, due to fibroids    Past Surgical History  Procedure Date  . Tubal ligation   . Carpal tunnel release   . Tonsillectomy and adenoidectomy   . Carpal tunnel release 2008    right hand. wears  brace on left    The following portions of the patient's history were reviewed and updated as appropriate: Allergies, current medications, and problem list.    Review of Systems: Pertinent positives per HPI. No other ROS done    History   Social History  . Marital Status: Married    Spouse Name: N/A    Number of Children: N/A  . Years of Education: N/A   Occupational History  . Not on file.   Social History Main Topics  . Smoking status: Never Smoker   . Smokeless tobacco: Never Used  . Alcohol Use: No  . Drug Use: No  . Sexually Active: Yes   Other Topics Concern  . Not on file   Social History Narrative  . No narrative on file    Objective:  There were no vitals taken for this  visit.  Assessment and Plan:  Dysuria Her UA and culture were negative for signs of infection or inflammation. She later returned for pelvic evaluation.   Updated Medication List Outpatient Encounter Prescriptions as of 05/21/2012  Medication Sig Dispense Refill  . hydrochlorothiazide (HYDRODIURIL) 25 MG tablet Take 1 tablet (25 mg total) by mouth daily.  90 tablet  3  . metoprolol succinate (TOPROL XL) 100 MG 24 hr tablet Take 1 tablet (100 mg total) by mouth daily.  90 tablet  3  . mometasone (NASONEX) 50 MCG/ACT nasal spray Place 2 sprays into the nose daily.  17 g  2  . Multiple Vitamin (MULTIVITAMIN) tablet Take 1 tablet by mouth daily.        . Multiple Vitamins-Minerals (HAIR/SKIN/NAILS PO) Take by mouth daily.        . naproxen (NAPROSYN) 500 MG tablet Take 1 tablet (500 mg total) by mouth daily.  90 tablet  3  . pseudoephedrine-guaifenesin (MUCINEX D) 60-600 MG per tablet Take 1 tablet by mouth every 12 (twelve) hours.           Orders Placed This Encounter  Procedures  . Urine culture  . POCT Urinalysis Dipstick    No Follow-up on file.

## 2012-05-24 NOTE — Telephone Encounter (Signed)
Left message on machine at home for patient to return call. 

## 2012-05-24 NOTE — Telephone Encounter (Signed)
With a normal UA she may need to have a CT to rule out kidney stones,  So definitely needs evaluation .  Either UC or ER if she can't be seen here

## 2012-05-25 NOTE — Telephone Encounter (Signed)
Patient advised as instructed via telephone, she stated that this was last week and didn't know why I was just now calling her back.  Advised patient that I was just doing what Dr. Darrick Huntsman requested of me and I really didn't know the situation.  She stated that if she feels worse or symptoms change she will let us know.

## 2012-06-24 ENCOUNTER — Telehealth: Payer: Self-pay | Admitting: Internal Medicine

## 2012-06-24 NOTE — Telephone Encounter (Signed)
Caller: Yurika/Patient; Patient Name: Amy Petty; PCP: Duncan Dull; Best Callback Phone Number: [phone].  Pt states that she went to the office 2 weeks ago for same symptoms; pt gave urine specimen and went home.  PT has been giving a prescription for Cipro and she has been taking the medicine for 3 days but it is not helping.  She still has urgency and a burning sensation when she goes.  All emergent symptoms of Urinary Symptoms - Female Protocol ruled out with exception to 'Evaluated by provider and symptoms worsening after following recommended treatment plan for 72 hours'.  Home care advice given and appt scheduled for 06/25/12 at 2:45 with Dr Darrick Huntsman

## 2012-06-25 ENCOUNTER — Ambulatory Visit: Payer: BC Managed Care – PPO | Admitting: Internal Medicine

## 2012-08-11 ENCOUNTER — Encounter: Payer: Self-pay | Admitting: Internal Medicine

## 2012-08-11 ENCOUNTER — Ambulatory Visit (INDEPENDENT_AMBULATORY_CARE_PROVIDER_SITE_OTHER): Payer: No Typology Code available for payment source | Admitting: Internal Medicine

## 2012-08-11 VITALS — BP 118/66 | HR 85 | Temp 98.5°F | Ht 69.0 in | Wt 269.5 lb

## 2012-08-11 DIAGNOSIS — Z0000 Encounter for general adult medical examination without abnormal findings: Secondary | ICD-10-CM

## 2012-08-11 DIAGNOSIS — Z23 Encounter for immunization: Secondary | ICD-10-CM

## 2012-08-11 DIAGNOSIS — Z0001 Encounter for general adult medical examination with abnormal findings: Secondary | ICD-10-CM | POA: Insufficient documentation

## 2012-08-11 DIAGNOSIS — Z1239 Encounter for other screening for malignant neoplasm of breast: Secondary | ICD-10-CM

## 2012-08-11 NOTE — Assessment & Plan Note (Signed)
Breast, PAP and pelvic exam done today. Mammogram ordered

## 2012-08-11 NOTE — Patient Instructions (Addendum)
Try flushing sinuses at night with Simply Saline (2 times daily) to decrease the viral and bacterial burdenin your nose.

## 2012-08-11 NOTE — Progress Notes (Signed)
Patient ID: Amy Petty, female   DOB: 1956-04-05, 56 y.o.   MRN: [id number]  Subjective:     Amy Petty is a 56 y.o. female and is here for a comprehensive physical exam. The patient reports no problems.  History   Social History  . Marital Status: Married    Spouse Name: N/A    Number of Children: N/A  . Years of Education: N/A   Occupational History  . Not on file.   Social History Main Topics  . Smoking status: Never Smoker   . Smokeless tobacco: Never Used  . Alcohol Use: No  . Drug Use: No  . Sexually Active: Yes   Other Topics Concern  . Not on file   Social History Narrative  . No narrative on file   Health Maintenance  Topic Date Due  . Pap Smear  08/11/2012  . Influenza Vaccine  07/11/2013  . Mammogram  08/11/2013  . Colonoscopy  08/11/2017  . Tetanus/tdap  08/19/2021    The following portions of the patient's history were reviewed and updated as appropriate: allergies, current medications, past family history, past medical history, past social history, past surgical history and problem list.  Review of Systems A comprehensive review of systems was negative.   Objective:    BP 118/66  Pulse 85  Temp 98.5 F (36.9 C) (Oral)  Ht 5\' 9"  (1.753 m)  Wt 269 lb 8 oz (122.244 kg)  BMI 39.80 kg/m2  SpO2 97%  LMP 07/26/2012 General appearance: alert, cooperative and appears stated age Eyes: conjunctivae/corneas clear. PERRL, EOM's intact. Fundi benign. Nose: Nares normal. Septum midline. Mucosa normal. No drainage or sinus tenderness. Neck: no adenopathy, no carotid bruit, no JVD, supple, symmetrical, trachea midline and thyroid not enlarged, symmetric, no tenderness/mass/nodules Lungs: clear to auscultation bilaterally Breasts: normal appearance, no masses or tenderness Heart: regular rate and rhythm, S1, S2 normal, no murmur, click, rub or gallop Abdomen: soft, non-tender; bowel sounds normal; no masses,  no organomegaly Pelvic: cervix normal in appearance,  external genitalia normal, no adnexal masses or tenderness, no cervical motion tenderness, rectovaginal septum normal, uterus normal size, shape, and consistency and vagina normal without discharge Extremities: extremities normal, atraumatic, no cyanosis or edema Skin: Skin color, texture, turgor normal. No rashes or lesions Neurologic: Grossly normal    Assessment:   Routine general medical examination at a health care facility Breast, PAP and pelvic exam done today. Mammogram ordered    Updated Medication List Outpatient Encounter Prescriptions as of 08/11/2012  Medication Sig Dispense Refill  . hydrochlorothiazide (HYDRODIURIL) 25 MG tablet Take 1 tablet (25 mg total) by mouth daily.  90 tablet  3  . metoprolol succinate (TOPROL XL) 100 MG 24 hr tablet Take 1 tablet (100 mg total) by mouth daily.  90 tablet  3  . Multiple Vitamin (MULTIVITAMIN) tablet Take 1 tablet by mouth daily.        . Multiple Vitamins-Minerals (HAIR/SKIN/NAILS PO) Take by mouth daily.        . naproxen (NAPROSYN) 500 MG tablet Take 1 tablet (500 mg total) by mouth daily.  90 tablet  3  . mometasone (NASONEX) 50 MCG/ACT nasal spray Place 2 sprays into the nose daily.  17 g  2  . pseudoephedrine-guaifenesin (MUCINEX D) 60-600 MG per tablet Take 1 tablet by mouth every 12 (twelve) hours.

## 2012-08-12 ENCOUNTER — Telehealth: Payer: Self-pay | Admitting: Internal Medicine

## 2012-08-12 ENCOUNTER — Other Ambulatory Visit (HOSPITAL_COMMUNITY)
Admission: RE | Admit: 2012-08-12 | Discharge: 2012-08-12 | Disposition: A | Discharge status: Home / Self Care | Payer: No Typology Code available for payment source | Source: Ambulatory Visit | Attending: Internal Medicine | Admitting: Internal Medicine

## 2012-08-12 DIAGNOSIS — Z1151 Encounter for screening for human papillomavirus (HPV): Secondary | ICD-10-CM | POA: Insufficient documentation

## 2012-08-12 DIAGNOSIS — Z01419 Encounter for gynecological examination (general) (routine) without abnormal findings: Secondary | ICD-10-CM | POA: Insufficient documentation

## 2012-08-12 DIAGNOSIS — Z0000 Encounter for general adult medical examination without abnormal findings: Secondary | ICD-10-CM

## 2012-08-12 NOTE — Telephone Encounter (Signed)
[name] @ [facility] called Amy Petty pap is ordered incorrect  Needs to be ordered through epic Please call mary when this is done.

## 2012-08-12 NOTE — Telephone Encounter (Signed)
Pap order entered into epic.  Krisit called the lab back to let them know.

## 2012-08-18 ENCOUNTER — Ambulatory Visit: Payer: Self-pay | Admitting: Internal Medicine

## 2012-08-24 ENCOUNTER — Ambulatory Visit: Payer: BC Managed Care – PPO | Admitting: Internal Medicine

## 2012-10-06 ENCOUNTER — Other Ambulatory Visit: Payer: Self-pay | Admitting: Internal Medicine

## 2012-11-25 ENCOUNTER — Telehealth: Payer: Self-pay | Admitting: Internal Medicine

## 2012-11-25 NOTE — Telephone Encounter (Signed)
Attempted to contact pt at number provided for voice mail call back request.  Left message for pt to call office back.

## 2012-12-01 ENCOUNTER — Ambulatory Visit: Payer: No Typology Code available for payment source | Admitting: Internal Medicine

## 2012-12-07 ENCOUNTER — Encounter: Payer: Self-pay | Admitting: Internal Medicine

## 2012-12-07 ENCOUNTER — Ambulatory Visit (INDEPENDENT_AMBULATORY_CARE_PROVIDER_SITE_OTHER): Payer: No Typology Code available for payment source | Admitting: Internal Medicine

## 2012-12-07 VITALS — BP 128/82 | HR 80 | Temp 97.5°F | Resp 16 | Wt 280.0 lb

## 2012-12-07 DIAGNOSIS — M659 Synovitis and tenosynovitis, unspecified: Secondary | ICD-10-CM

## 2012-12-07 DIAGNOSIS — Z1239 Encounter for other screening for malignant neoplasm of breast: Secondary | ICD-10-CM

## 2012-12-07 DIAGNOSIS — M2550 Pain in unspecified joint: Secondary | ICD-10-CM

## 2012-12-07 DIAGNOSIS — J069 Acute upper respiratory infection, unspecified: Secondary | ICD-10-CM

## 2012-12-07 DIAGNOSIS — M65849 Other synovitis and tenosynovitis, unspecified hand: Secondary | ICD-10-CM

## 2012-12-07 MED ORDER — HYDROCODONE-HOMATROPINE 5-1.5 MG/5ML PO SYRP: 5.0000 mL | ORAL_SOLUTION | Freq: Three times a day (TID) | ORAL | Status: DC | PRN

## 2012-12-07 MED ORDER — PREDNISONE (PAK) 10 MG PO TABS: ORAL_TABLET | ORAL | Status: DC

## 2012-12-07 NOTE — Progress Notes (Signed)
Patient ID: Amy Petty, female   DOB: [birth year], 57 y.o.   MRN: [id number]    Patient Active Problem List  Diagnosis  . SVT (supraventricular tachycardia)  . HTN (hypertension)  . Obesity  . Fibroid, uterus  . Anemia  . Screening for cervical cancer  . Screening for breast cancer  . Screening for colon cancer  . Allergic rhinitis  . Tendonitis, Achilles, right  . Peri-menopause  . Menorrhagia  . Dysuria  . Routine general medical examination at a health care facility  . Synovitis of finger  . URI, acute    Subjective:  CC:   Chief Complaint  Patient presents with  . Pain    Rt hand    HPI:   Amy Petty a 57 y.o. female who presents 1) right middle finger pain and swelling.  She has developed pain and swelling of the PIP joint which has been present for  the past  2 to 3 weeks, and has resumed wearing her carpal tunnel brace on the right hand which has helped somewhat,  Prior carpal tunnel release was done in 2005. Feels she neeeds to pull the finger out of the joint to "pop" it.   Tried naproxen for 3 days and swelling has gone down.  No recent abnormal activities or yard work,  No trauma.  Does not use a "mouse" or keyboard for more than an hour collectively daily. There is no FH of arthritis but she also has chronic knee pain . Bilateral .  No other joints swollen   2) sinus symptoms.  Her ears feel plugged,  Nonproductive cough ,  X 7 or 8 days    Past Medical History  Diagnosis Date  . Hypertension   . PVC's (premature ventricular contractions)   . SVT (supraventricular tachycardia)   . Obesity   . Hypothyroidism   . Diastolic dysfunction     Grade 1 per echo Oct 2012; EF is normal.  . Fibroid, uterus     with menorrhagia  . Anemia     iron deficiency, due to fibroids    Past Surgical History  Procedure Date  . Tubal ligation   . Carpal tunnel release   . Tonsillectomy and adenoidectomy   . Carpal tunnel release 2008    right hand. wears  brace on left     The following portions of the patient's history were reviewed and updated as appropriate: Allergies, current medications, and problem list.    Review of Systems:   Patient denies headache, fevers, malaise, unintentional weight loss, skin rash, eye pain, sinus pain, sore throat, dysphagia,  hemoptysis , , dyspnea, wheezing, chest pain, palpitations, orthopnea, edema, abdominal pain, nausea, melena, diarrhea, constipation, flank pain, dysuria, hematuria, urinary  Frequency, nocturia, numbness, tingling, seizures,  Focal weakness, Loss of consciousness,  Tremor, insomnia, depression, anxiety, and suicidal ideation.      History   Social History  . Marital Status: Married    Spouse Name: N/A    Number of Children: N/A  . Years of Education: N/A   Occupational History  . Not on file.   Social History Main Topics  . Smoking status: Never Smoker   . Smokeless tobacco: Never Used  . Alcohol Use: No  . Drug Use: No  . Sexually Active: Yes   Other Topics Concern  . Not on file   Social History Narrative  . No narrative on file    Objective:  BP 128/82  Pulse 80  Temp 97.5  F (36.4 C) (Oral)  Resp 16  Wt 280 lb (127.007 kg)  General appearance: alert, cooperative and appears stated age Ears: normal TM's and external ear canals both ears Throat: lips, mucosa, and tongue normal; teeth and gums normal Neck: no adenopathy, no carotid bruit, supple, symmetrical, trachea midline and thyroid not enlarged, symmetric, no tenderness/mass/nodules Back: symmetric, no curvature. ROM normal. No CVA tenderness. Lungs: clear to auscultation bilaterally Heart: regular rate and rhythm, S1, S2 normal, no murmur, click, rub or gallop Abdomen: soft, non-tender; bowel sounds normal; no masses,  no organomegaly Pulses: 2+ and symmetric MSK: right middle finger. Pain to palpation with synovitis noted at PIP joint.  Skin: Skin color, texture, turgor normal. No rashes or lesions Lymph nodes:  Cervical, supraclavicular, and axillary nodes normal.  Assessment and Plan:  Screening for breast cancer She is overdue for annual mammogram done at [facility] in Sept 2012 last recorded.    Synovitis of finger Testing for inflammatory arthritis recommended  given multiple joint complaints over the last several years.  Plain films ordered. Continue NSAIDs for now.   URI, acute Her Symptoms of  URI are caused by viral infection currently given her current symptoms.   I have explained that in viral URIS, an antibiotic will not help the symptoms and will increase the risk of developing diarrhea. Advised to use oral and nasal decongestants,  Ibuprofen 400 mg and tylenol 650 mq 8 hrs for aches and pains,  Advised to call for abx only if symptoms worsen to include fevers, facial pain, purulent sputum./drainage.    Updated Medication List Outpatient Encounter Prescriptions as of 12/07/2012  Medication Sig Dispense Refill  . hydrochlorothiazide (HYDRODIURIL) 25 MG tablet Take 1 tablet (25 mg total) by mouth daily.  90 tablet  3  . metoprolol succinate (TOPROL XL) 100 MG 24 hr tablet Take 1 tablet (100 mg total) by mouth daily.  90 tablet  3  . Multiple Vitamin (MULTIVITAMIN) tablet Take 1 tablet by mouth daily.        . Multiple Vitamins-Minerals (HAIR/SKIN/NAILS PO) Take by mouth daily.        . naproxen (NAPROSYN) 500 MG tablet TAKE ONE TABLET BY MOUTH ONE TIME DAILY  90 tablet  2  . pseudoephedrine-guaifenesin (MUCINEX D) 60-600 MG per tablet Take 1 tablet by mouth every 12 (twelve) hours.        Marland Kitchen HYDROcodone-homatropine (HYCODAN) 5-1.5 MG/5ML syrup Take 5 mLs by mouth every 8 (eight) hours as needed for cough.  180 mL  0  . mometasone (NASONEX) 50 MCG/ACT nasal spray Place 2 sprays into the nose daily.  17 g  2  . predniSONE (STERAPRED UNI-PAK) 10 MG tablet 6 tablets on Day 1 , then reduce by 1 tablet daily until gone  21 tablet  0     Orders Placed This Encounter  Procedures  . DG Finger Middle  Right  . HM MAMMOGRAPHY  . MM Digital Screening  . ANA  . Sedimentation rate  . C-reactive protein  . Uric acid  . HM PAP SMEAR    No Follow-up on file.

## 2012-12-07 NOTE — Patient Instructions (Addendum)
You have a viral  Syndrome .  The post nasal drip is causing your sore throat.  Flush your sinuses twice daly with Simply saline nasal spray.  Use benadryl 25 mg every 8 hours and Sudafed PE 10 to 30 every 8 hours to manage the drainage and congestion.  Gargle with salt water often for the sore throat.  I am calling in hycodan cough syrup (has  Hydrocodone  for the cough.  ------------------------------------------------------------------------------------------------  I am ordering an x ray of your middle finger and checking a few blood tests to look for reasons why it is so enflamed  Stop the naproxen until prednisone is done.  Ok to take tylenol if needed

## 2012-12-08 ENCOUNTER — Encounter: Payer: Self-pay | Admitting: Internal Medicine

## 2012-12-08 DIAGNOSIS — M659 Synovitis and tenosynovitis, unspecified: Secondary | ICD-10-CM | POA: Insufficient documentation

## 2012-12-08 DIAGNOSIS — J069 Acute upper respiratory infection, unspecified: Secondary | ICD-10-CM | POA: Insufficient documentation

## 2012-12-08 LAB — ANA: Anti Nuclear Antibody(ANA): NEGATIVE

## 2012-12-08 LAB — SEDIMENTATION RATE: Sed Rate: 17 mm/hr (ref 0–22)

## 2012-12-08 LAB — C-REACTIVE PROTEIN: CRP: 1.7 mg/dL (ref 0.5–20.0)

## 2012-12-08 LAB — URIC ACID: Uric Acid, Serum: 5.1 mg/dL (ref 2.4–7.0)

## 2012-12-08 NOTE — Assessment & Plan Note (Signed)
She is overdue for annual mammogram done at [facility] in Sept 2012 last recorded.

## 2012-12-08 NOTE — Assessment & Plan Note (Signed)
Testing for inflammatory arthritis recommended  given multiple joint complaints over the last several years.  Plain films ordered. Continue NSAIDs for now.

## 2012-12-08 NOTE — Assessment & Plan Note (Signed)
Her Symptoms of  URI are caused by viral infection currently given her current symptoms.   I have explained that in viral URIS, an antibiotic will not help the symptoms and will increase the risk of developing diarrhea. Advised to use oral and nasal decongestants,  Ibuprofen 400 mg and tylenol 650 mq 8 hrs for aches and pains,  Advised to call for abx only if symptoms worsen to include fevers, facial pain, purulent sputum./drainage.

## 2012-12-09 ENCOUNTER — Ambulatory Visit (INDEPENDENT_AMBULATORY_CARE_PROVIDER_SITE_OTHER)
Admission: RE | Admit: 2012-12-09 | Discharge: 2012-12-09 | Disposition: A | Discharge status: Home / Self Care | Payer: No Typology Code available for payment source | Source: Ambulatory Visit | Attending: Internal Medicine | Admitting: Internal Medicine

## 2012-12-09 DIAGNOSIS — M2550 Pain in unspecified joint: Secondary | ICD-10-CM

## 2013-04-09 ENCOUNTER — Other Ambulatory Visit: Payer: Self-pay | Admitting: Internal Medicine

## 2013-05-23 ENCOUNTER — Other Ambulatory Visit: Payer: Self-pay | Admitting: Internal Medicine

## 2013-06-25 ENCOUNTER — Other Ambulatory Visit: Payer: Self-pay | Admitting: Internal Medicine

## 2013-08-15 ENCOUNTER — Encounter: Payer: Self-pay | Admitting: Internal Medicine

## 2013-08-15 ENCOUNTER — Ambulatory Visit (INDEPENDENT_AMBULATORY_CARE_PROVIDER_SITE_OTHER): Payer: BC Managed Care – PPO | Admitting: Internal Medicine

## 2013-08-15 VITALS — BP 124/76 | HR 64 | Temp 98.0°F | Resp 12 | Ht 68.1 in | Wt 241.5 lb

## 2013-08-15 DIAGNOSIS — R5381 Other malaise: Secondary | ICD-10-CM

## 2013-08-15 DIAGNOSIS — E669 Obesity, unspecified: Secondary | ICD-10-CM

## 2013-08-15 DIAGNOSIS — E559 Vitamin D deficiency, unspecified: Secondary | ICD-10-CM

## 2013-08-15 DIAGNOSIS — Z23 Encounter for immunization: Secondary | ICD-10-CM

## 2013-08-15 DIAGNOSIS — N920 Excessive and frequent menstruation with regular cycle: Secondary | ICD-10-CM

## 2013-08-15 DIAGNOSIS — I10 Essential (primary) hypertension: Secondary | ICD-10-CM

## 2013-08-15 DIAGNOSIS — Z1239 Encounter for other screening for malignant neoplasm of breast: Secondary | ICD-10-CM

## 2013-08-15 DIAGNOSIS — Z0000 Encounter for general adult medical examination without abnormal findings: Secondary | ICD-10-CM

## 2013-08-15 LAB — COMPREHENSIVE METABOLIC PANEL
Alkaline Phosphatase: 75 U/L (ref 39–117)
BUN: 15 mg/dL (ref 6–23)
CO2: 33 mEq/L — ABNORMAL HIGH (ref 19–32)
Calcium: 9.4 mg/dL (ref 8.4–10.5)
Creatinine, Ser: 0.6 mg/dL (ref 0.4–1.2)
GFR: 107.67 mL/min (ref >60.00)
Glucose, Bld: 89 mg/dL (ref 70–99)
Total Bilirubin: 1 mg/dL (ref 0.3–1.2)
Total Protein: 7 g/dL (ref 6.0–8.3)

## 2013-08-15 MED ORDER — METOPROLOL SUCCINATE ER 100 MG PO TB24: ORAL_TABLET | ORAL | Status: DC

## 2013-08-15 MED ORDER — HYDROCHLOROTHIAZIDE 25 MG PO TABS: ORAL_TABLET | ORAL | Status: DC

## 2013-08-15 NOTE — Progress Notes (Signed)
Patient ID: Amy Petty, female   DOB: 26-Jul-1956, 57 y.o.   MRN: [id number]    Subjective:     Amy Petty is a 57 y.o. female and is here for a comprehensive physical exam. The patient reports no problems.  History   Social History  . Marital Status: Married    Spouse Name: N/A    Number of Children: N/A  . Years of Education: N/A   Occupational History  . Not on file.   Social History Main Topics  . Smoking status: Never Smoker   . Smokeless tobacco: Never Used  . Alcohol Use: No  . Drug Use: No  . Sexual Activity: Yes   Other Topics Concern  . Not on file   Social History Narrative  . No narrative on file   Health Maintenance  Topic Date Due  . Influenza Vaccine  06/10/2014  . Mammogram  09/07/2014  . Pap Smear  08/24/2015  . Colonoscopy  08/11/2017  . Tetanus/tdap  08/19/2021    The following portions of the patient's history were reviewed and updated as appropriate: allergies, current medications, past family history, past medical history, past social history, past surgical history and problem list.  Review of Systems A comprehensive review of systems was negative.   Objective:   BP 124/76  Pulse 64  Temp(Src) 98 F (36.7 C) (Oral)  Resp 12  Ht 5' 8.1" (1.73 m)  Wt 241 lb 8 oz (109.544 kg)  BMI 36.6 kg/m2  SpO2 98%  LMP 05/13/2013  General Appearance:    Alert, cooperative, no distress, appears stated age  Head:    Normocephalic, without obvious abnormality, atraumatic  Eyes:    PERRL, conjunctiva/corneas clear, EOM's intact, fundi    benign, both eyes  Ears:    Normal TM's and external ear canals, both ears  Nose:   Nares normal, septum midline, mucosa normal, no drainage    or sinus tenderness  Throat:   Lips, mucosa, and tongue normal; teeth and gums normal  Neck:   Supple, symmetrical, trachea midline, no adenopathy;    thyroid:  no enlargement/tenderness/nodules; no carotid   bruit or JVD  Back:     Symmetric, no curvature, ROM normal, no CVA  tenderness  Lungs:     Clear to auscultation bilaterally, respirations unlabored  Chest Wall:    No tenderness or deformity   Heart:    Regular rate and rhythm, S1 and S2 normal, no murmur, rub   or gallop  Breast Exam:    No tenderness, masses, or nipple abnormality  Abdomen:     Soft, non-tender, bowel sounds active all four quadrants,    no masses, no organomegaly     Extremities:   Extremities normal, atraumatic, no cyanosis or edema  Pulses:   2+ and symmetric all extremities  Skin:   Skin color, texture, turgor normal, no rashes or lesions  Lymph nodes:   Cervical, supraclavicular, and axillary nodes normal  Neurologic:   CNII-XII intact, normal strength, sensation and reflexes    throughout   Assessment:   Obesity Patient has lost 40 pounds so far following a low glycemic index diet. We have discussed her goals  And recommended weight los of 10% of body weigh over the next 6 months using a low glycemic index diet and regular exercise a minimum of 5 days per week.    Routine general medical examination at a health care facility Annual comprehensive exam was done including breast, excluding  Pelvic exam .  All screenings have been addressed .   HTN (hypertension) Well controlled on current regimen. Renal function stable, no changes today.  Menorrhagia Secondary to an perimenopause and fibroid uterus.   Updated Medication List Outpatient Encounter Prescriptions as of 08/15/2013  Medication Sig Dispense Refill  . hydrochlorothiazide (HYDRODIURIL) 25 MG tablet TAKE ONE TABLET BY MOUTH ONE TIME DAILY  90 tablet  2  . metoprolol succinate (TOPROL-XL) 100 MG 24 hr tablet TAKE ONE TABLET BY MOUTH ONE TIME DAILY  90 tablet  3  . Multiple Vitamin (MULTIVITAMIN) tablet Take 1 tablet by mouth daily.        . Multiple Vitamins-Minerals (HAIR/SKIN/NAILS PO) Take by mouth daily.        . naproxen (NAPROSYN) 500 MG tablet Take one tablet by mouth one time daily  90 tablet  0  .  [DISCONTINUED] hydrochlorothiazide (HYDRODIURIL) 25 MG tablet TAKE ONE TABLET BY MOUTH ONE TIME DAILY  90 tablet  0  . [DISCONTINUED] metoprolol succinate (TOPROL-XL) 100 MG 24 hr tablet TAKE ONE TABLET BY MOUTH ONE TIME DAILY  30 tablet  5  . [DISCONTINUED] HYDROcodone-homatropine (HYCODAN) 5-1.5 MG/5ML syrup Take 5 mLs by mouth every 8 (eight) hours as needed for cough.  180 mL  0  . [DISCONTINUED] mometasone (NASONEX) 50 MCG/ACT nasal spray Place 2 sprays into the nose daily.  17 g  2  . [DISCONTINUED] predniSONE (STERAPRED UNI-PAK) 10 MG tablet 6 tablets on Day 1 , then reduce by 1 tablet daily until gone  21 tablet  0  . [DISCONTINUED] pseudoephedrine-guaifenesin (MUCINEX D) 60-600 MG per tablet Take 1 tablet by mouth every 12 (twelve) hours.         No facility-administered encounter medications on file as of 08/15/2013.

## 2013-08-15 NOTE — Patient Instructions (Addendum)
I recommend the Prevnar pneumonia vaccine .  It should be available in a  Week or so .  Congratulations on the weight loss!!   Return in 6 months

## 2013-08-16 ENCOUNTER — Encounter: Payer: Self-pay | Admitting: Internal Medicine

## 2013-08-16 DIAGNOSIS — Z0000 Encounter for general adult medical examination without abnormal findings: Secondary | ICD-10-CM | POA: Insufficient documentation

## 2013-08-16 NOTE — Assessment & Plan Note (Signed)
Annual comprehensive exam was done including breast, excluding  Pelvic exam . All screenings have been addressed .

## 2013-08-16 NOTE — Assessment & Plan Note (Signed)
Secondary to an perimenopause and fibroid uterus.

## 2013-08-16 NOTE — Assessment & Plan Note (Signed)
Patient has lost 40 pounds so far following a low glycemic index diet. We have discussed her goals  And recommended weight los of 10% of body weigh over the next 6 months using a low glycemic index diet and regular exercise a minimum of 5 days per week.

## 2013-08-16 NOTE — Assessment & Plan Note (Signed)
Well controlled on current regimen. Renal function stable, no changes today. 

## 2013-08-17 LAB — VITAMIN D 25 HYDROXY (VIT D DEFICIENCY, FRACTURES): Vit D, 25-Hydroxy: 58 ng/mL (ref 30–89)

## 2013-08-18 NOTE — Telephone Encounter (Signed)
Mailed unread message to pt  

## 2013-08-25 ENCOUNTER — Encounter: Payer: Self-pay | Admitting: Emergency Medicine

## 2013-12-14 ENCOUNTER — Other Ambulatory Visit: Payer: Self-pay | Admitting: Internal Medicine

## 2013-12-15 NOTE — Telephone Encounter (Signed)
LAst OV 08/15/13, ok refill?

## 2014-03-24 ENCOUNTER — Other Ambulatory Visit: Payer: Self-pay | Admitting: Internal Medicine

## 2014-07-03 ENCOUNTER — Other Ambulatory Visit: Payer: Self-pay | Admitting: Internal Medicine

## 2014-07-05 ENCOUNTER — Encounter: Payer: Self-pay | Admitting: Internal Medicine

## 2014-07-06 NOTE — Telephone Encounter (Signed)
See mychart message, refill?

## 2014-07-07 MED ORDER — NAPROXEN 500 MG PO TABS: ORAL_TABLET | ORAL | Status: DC

## 2014-07-07 NOTE — Telephone Encounter (Signed)
Patient also left voicemail today on my phone please advise ok to approve. Sent patient a message because she stated in her message triage at not returned call.

## 2014-07-07 NOTE — Telephone Encounter (Signed)
Ok to refill,  Refill sent  

## 2014-08-21 ENCOUNTER — Encounter: Payer: BC Managed Care – PPO | Admitting: Internal Medicine

## 2014-09-15 ENCOUNTER — Other Ambulatory Visit: Payer: Self-pay | Admitting: Internal Medicine

## 2014-09-16 ENCOUNTER — Other Ambulatory Visit: Payer: Self-pay | Admitting: Internal Medicine

## 2014-09-26 ENCOUNTER — Ambulatory Visit (INDEPENDENT_AMBULATORY_CARE_PROVIDER_SITE_OTHER): Payer: No Typology Code available for payment source | Admitting: Internal Medicine

## 2014-09-26 ENCOUNTER — Encounter: Payer: Self-pay | Admitting: Internal Medicine

## 2014-09-26 VITALS — BP 118/78 | HR 69 | Temp 98.0°F | Resp 16 | Ht 68.75 in | Wt 285.0 lb

## 2014-09-26 DIAGNOSIS — E785 Hyperlipidemia, unspecified: Secondary | ICD-10-CM

## 2014-09-26 DIAGNOSIS — Z23 Encounter for immunization: Secondary | ICD-10-CM

## 2014-09-26 DIAGNOSIS — E669 Obesity, unspecified: Secondary | ICD-10-CM

## 2014-09-26 DIAGNOSIS — I10 Essential (primary) hypertension: Secondary | ICD-10-CM

## 2014-09-26 DIAGNOSIS — R5383 Other fatigue: Secondary | ICD-10-CM

## 2014-09-26 DIAGNOSIS — Z0000 Encounter for general adult medical examination without abnormal findings: Secondary | ICD-10-CM

## 2014-09-26 DIAGNOSIS — Z1159 Encounter for screening for other viral diseases: Secondary | ICD-10-CM

## 2014-09-26 MED ORDER — PHENTERMINE HCL 37.5 MG PO TABS: ORAL_TABLET | ORAL | Status: DC

## 2014-09-26 MED ORDER — ALPRAZOLAM 0.5 MG PO TABS: 0.5000 mg | ORAL_TABLET | Freq: Every evening | ORAL | Status: DC | PRN

## 2014-09-26 NOTE — Progress Notes (Signed)
Patient ID: Amy Petty, female   DOB: October 02, 1956, 58 y.o.   MRN: [id number]   Subjective:     Amy Petty is a 58 y.o. female and is here for a comprehensive physical exam. The patient reports recent epidoes of shingles, and increased anxiety related to husband's diagnosis of lymphoma.  History   Social History  . Marital Status: Married    Spouse Name: N/A    Number of Children: N/A  . Years of Education: N/A   Occupational History  . Not on file.   Social History Main Topics  . Smoking status: Never Smoker   . Smokeless tobacco: Never Used  . Alcohol Use: No  . Drug Use: No  . Sexual Activity: Yes   Other Topics Concern  . Not on file   Social History Narrative   Health Maintenance  Topic Date Due  . MAMMOGRAM  09/07/2014  . INFLUENZA VACCINE  06/11/2015  . PAP SMEAR  08/24/2015  . COLONOSCOPY  08/11/2017  . TETANUS/TDAP  08/19/2021    The following portions of the patient's history were reviewed and updated as appropriate: allergies, current medications, past family history, past medical history, past social history, past surgical history and problem list.  Review of Systems A comprehensive review of systems was negative.   Objective:   BP 118/78 mmHg  Pulse 69  Temp(Src) 98 F (36.7 C) (Oral)  Resp 16  Ht 5' 8.75" (1.746 m)  Wt 285 lb (129.275 kg)  BMI 42.41 kg/m2  SpO2 97%  LMP 11/23/2013 (Approximate)   General appearance: alert, cooperative and appears stated age Head: Normocephalic, without obvious abnormality, atraumatic Eyes: conjunctivae/corneas clear. PERRL, EOM's intact. Fundi benign. Ears: normal TM's and external ear canals both ears Nose: Nares normal. Septum midline. Mucosa normal. No drainage or sinus tenderness. Throat: lips, mucosa, and tongue normal; teeth and gums normal Neck: no adenopathy, no carotid bruit, no JVD, supple, symmetrical, trachea midline and thyroid not enlarged, symmetric, no tenderness/mass/nodules Lungs: clear to  auscultation bilaterally Breasts: normal appearance, no masses or tenderness Heart: regular rate and rhythm, S1, S2 normal, no murmur, click, rub or gallop Abdomen: soft, non-tender; bowel sounds normal; no masses,  no organomegaly Extremities: extremities normal, atraumatic, no cyanosis or edema Pulses: 2+ and symmetric Skin: Skin color, texture, turgor normal. No rashes or lesions Neurologic: Alert and oriented X 3, normal strength and tone. Normal symmetric reflexes. Normal coordination and gait.   .    Assessment and Plan:   HTN (hypertension) Well controlled on current regimen. Renal function stable, no changes today.  Lab Results  Component Value Date   CREATININE 0.7 09/26/2014   Lab Results  Component Value Date   NA 139 09/26/2014   K 3.9 09/26/2014   CL 102 09/26/2014   CO2 27 09/26/2014     Obesity I have addressed  BMI and recommended wt loss of 10% of body weight over the next 6 months using a low glycemic index diet and regular exercise a minimum of 5 days per week.    Routine general medical examination at a health care facility Annual wellness  exam was done as well as a comprehensive physical exam and management of acute and chronic conditions .  During the course of the visit the patient was educated and counseled about appropriate screening and preventive services including :  diabetes screening, lipid analysis with projected  10 year  risk for CAD , nutrition counseling, colorectal cancer screening, and recommended immunizations.  Printed recommendations for health maintenance screenings was given.    Updated Medication List Outpatient Encounter Prescriptions as of 09/26/2014  Medication Sig  . Multiple Vitamin (MULTIVITAMIN) tablet Take 1 tablet by mouth daily.    . Multiple Vitamins-Minerals (HAIR/SKIN/NAILS PO) Take by mouth daily.    . naproxen (NAPROSYN) 500 MG tablet TAKE ONE TABLET BY MOUTH ONE TIME DAILY  . [DISCONTINUED] hydrochlorothiazide  (HYDRODIURIL) 25 MG tablet TAKE ONE TABLET BY MOUTH ONE TIME DAILY   . [DISCONTINUED] metoprolol succinate (TOPROL-XL) 100 MG 24 hr tablet TAKE ONE TABLET BY MOUTH ONE TIME DAILY   . ALPRAZolam (XANAX) 0.5 MG tablet Take 1 tablet (0.5 mg total) by mouth at bedtime as needed for anxiety.  . phentermine (ADIPEX-P) 37.5 MG tablet 1/2 tablet in the am and early afternoon

## 2014-09-26 NOTE — Progress Notes (Signed)
Pre-visit discussion using our clinic review tool. No additional management support is needed unless otherwise documented below in the visit note.  

## 2014-09-26 NOTE — Patient Instructions (Signed)
You had your annual  wellness exam today.  We will repeat your PAP smear in 2016, sooner if needed   We will schedule your mammogram   You received the influenza vaccine today.  If you develop a sore arm , use tylenol and ibuprofen for a few days   We will contact you with the bloodwork results  Health Maintenance Adopting a healthy lifestyle and getting preventive care can go a long way to promote health and wellness. Talk with your health care provider about what schedule of regular examinations is right for you. This is a good chance for you to check in with your provider about disease prevention and staying healthy. In between checkups, there are plenty of things you can do on your own. Experts have done a lot of research about which lifestyle changes and preventive measures are most likely to keep you healthy. Ask your health care provider for more information. WEIGHT AND DIET  Eat a healthy diet  Be sure to include plenty of vegetables, fruits, low-fat dairy products, and lean protein.  Do not eat a lot of foods high in solid fats, added sugars, or salt.  Get regular exercise. This is one of the most important things you can do for your health.  Most adults should exercise for at least 150 minutes each week. The exercise should increase your heart rate and make you sweat (moderate-intensity exercise).  Most adults should also do strengthening exercises at least twice a week. This is in addition to the moderate-intensity exercise.  Maintain a healthy weight  Body mass index (BMI) is a measurement that can be used to identify possible weight problems. It estimates body fat based on height and weight. Your health care provider can help determine your BMI and help you achieve or maintain a healthy weight.  For females 22 years of age and older:   A BMI below 18.5 is considered underweight.  A BMI of 18.5 to 24.9 is normal.  A BMI of 25 to 29.9 is considered overweight.  A BMI  of 30 and above is considered obese.  Watch levels of cholesterol and blood lipids  You should start having your blood tested for lipids and cholesterol at 57 years of age, then have this test every 5 years.  You may need to have your cholesterol levels checked more often if:  Your lipid or cholesterol levels are high.  You are older than 57 years of age.  You are at high risk for heart disease.  CANCER SCREENING   Lung Cancer  Lung cancer screening is recommended for adults 26-26 years old who are at high risk for lung cancer because of a history of smoking.  A yearly low-dose CT scan of the lungs is recommended for people who:  Currently smoke.  Have quit within the past 15 years.  Have at least a 30-pack-year history of smoking. A pack year is smoking an average of one pack of cigarettes a day for 1 year.  Yearly screening should continue until it has been 15 years since you quit.  Yearly screening should stop if you develop a health problem that would prevent you from having lung cancer treatment.  Breast Cancer  Practice breast self-awareness. This means understanding how your breasts normally appear and feel.  It also means doing regular breast self-exams. Let your health care provider know about any changes, no matter how small.  If you are in your 20s or 30s, you should have a clinical  breast exam (CBE) by a health care provider every 1-3 years as part of a regular health exam.  If you are 30 or older, have a CBE every year. Also consider having a breast X-ray (mammogram) every year.  If you have a family history of breast cancer, talk to your health care provider about genetic screening.  If you are at high risk for breast cancer, talk to your health care provider about having an MRI and a mammogram every year.  Breast cancer gene (BRCA) assessment is recommended for women who have family members with BRCA-related cancers. BRCA-related cancers  include:  Breast.  Ovarian.  Tubal.  Peritoneal cancers.  Results of the assessment will determine the need for genetic counseling and BRCA1 and BRCA2 testing. Cervical Cancer Routine pelvic examinations to screen for cervical cancer are no longer recommended for nonpregnant women who are considered low risk for cancer of the pelvic organs (ovaries, uterus, and vagina) and who do not have symptoms. A pelvic examination may be necessary if you have symptoms including those associated with pelvic infections. Ask your health care provider if a screening pelvic exam is right for you.   The Pap test is the screening test for cervical cancer for women who are considered at risk.  If you had a hysterectomy for a problem that was not cancer or a condition that could lead to cancer, then you no longer need Pap tests.  If you are older than 65 years, and you have had normal Pap tests for the past 10 years, you no longer need to have Pap tests.  If you have had past treatment for cervical cancer or a condition that could lead to cancer, you need Pap tests and screening for cancer for at least 20 years after your treatment.  If you no longer get a Pap test, assess your risk factors if they change (such as having a new sexual partner). This can affect whether you should start being screened again.  Some women have medical problems that increase their chance of getting cervical cancer. If this is the case for you, your health care provider may recommend more frequent screening and Pap tests.  The human papillomavirus (HPV) test is another test that may be used for cervical cancer screening. The HPV test looks for the virus that can cause cell changes in the cervix. The cells collected during the Pap test can be tested for HPV.  The HPV test can be used to screen women 82 years of age and older. Getting tested for HPV can extend the interval between normal Pap tests from three to five years.  An HPV  test also should be used to screen women of any age who have unclear Pap test results.  After 57 years of age, women should have HPV testing as often as Pap tests.  Colorectal Cancer  This type of cancer can be detected and often prevented.  Routine colorectal cancer screening usually begins at 57 years of age and continues through 57 years of age.  Your health care provider may recommend screening at an earlier age if you have risk factors for colon cancer.  Your health care provider may also recommend using home test kits to check for hidden blood in the stool.  A small camera at the end of a tube can be used to examine your colon directly (sigmoidoscopy or colonoscopy). This is done to check for the earliest forms of colorectal cancer.  Routine screening usually begins at age  50.  Direct examination of the colon should be repeated every 5-10 years through 57 years of age. However, you may need to be screened more often if early forms of precancerous polyps or small growths are found. Skin Cancer  Check your skin from head to toe regularly.  Tell your health care provider about any new moles or changes in moles, especially if there is a change in a mole's shape or color.  Also tell your health care provider if you have a mole that is larger than the size of a pencil eraser.  Always use sunscreen. Apply sunscreen liberally and repeatedly throughout the day.  Protect yourself by wearing long sleeves, pants, a wide-brimmed hat, and sunglasses whenever you are outside. HEART DISEASE, DIABETES, AND HIGH BLOOD PRESSURE   Have your blood pressure checked at least every 1-2 years. High blood pressure causes heart disease and increases the risk of stroke.  If you are between 52 years and 53 years old, ask your health care provider if you should take aspirin to prevent strokes.  Have regular diabetes screenings. This involves taking a blood sample to check your fasting blood sugar  level.  If you are at a normal weight and have a low risk for diabetes, have this test once every three years after 57 years of age.  If you are overweight and have a high risk for diabetes, consider being tested at a younger age or more often. PREVENTING INFECTION  Hepatitis B  If you have a higher risk for hepatitis B, you should be screened for this virus. You are considered at high risk for hepatitis B if:  You were born in a country where hepatitis B is common. Ask your health care provider which countries are considered high risk.  Your parents were born in a high-risk country, and you have not been immunized against hepatitis B (hepatitis B vaccine).  You have HIV or AIDS.  You use needles to inject street drugs.  You live with someone who has hepatitis B.  You have had sex with someone who has hepatitis B.  You get hemodialysis treatment.  You take certain medicines for conditions, including cancer, organ transplantation, and autoimmune conditions. Hepatitis C  Blood testing is recommended for:  Everyone born from [birth year] through 1964.  Anyone with known risk factors for hepatitis C. Sexually transmitted infections (STIs)  You should be screened for sexually transmitted infections (STIs) including gonorrhea and chlamydia if:  You are sexually active and are younger than 57 years of age.  You are older than 57 years of age and your health care provider tells you that you are at risk for this type of infection.  Your sexual activity has changed since you were last screened and you are at an increased risk for chlamydia or gonorrhea. Ask your health care provider if you are at risk.  If you do not have HIV, but are at risk, it may be recommended that you take a prescription medicine daily to prevent HIV infection. This is called pre-exposure prophylaxis (PrEP). You are considered at risk if:  You are sexually active and do not regularly use condoms or know the HIV status  of your partner(s).  You take drugs by injection.  You are sexually active with a partner who has HIV. Talk with your health care provider about whether you are at high risk of being infected with HIV. If you choose to begin PrEP, you should first be tested for HIV. You should  then be tested every 3 months for as long as you are taking PrEP.  PREGNANCY   If you are premenopausal and you may become pregnant, ask your health care provider about preconception counseling.  If you may become pregnant, take 400 to 800 micrograms (mcg) of folic acid every day.  If you want to prevent pregnancy, talk to your health care provider about birth control (contraception). OSTEOPOROSIS AND MENOPAUSE   Osteoporosis is a disease in which the bones lose minerals and strength with aging. This can result in serious bone fractures. Your risk for osteoporosis can be identified using a bone density scan.  If you are 49 years of age or older, or if you are at risk for osteoporosis and fractures, ask your health care provider if you should be screened.  Ask your health care provider whether you should take a calcium or vitamin D supplement to lower your risk for osteoporosis.  Menopause may have certain physical symptoms and risks.  Hormone replacement therapy may reduce some of these symptoms and risks. Talk to your health care provider about whether hormone replacement therapy is right for you.  HOME CARE INSTRUCTIONS   Schedule regular health, dental, and eye exams.  Stay current with your immunizations.   Do not use any tobacco products including cigarettes, chewing tobacco, or electronic cigarettes.  If you are pregnant, do not drink alcohol.  If you are breastfeeding, limit how much and how often you drink alcohol.  Limit alcohol intake to no more than 1 drink per day for nonpregnant women. One drink equals 12 ounces of beer, 5 ounces of wine, or 1 ounces of hard liquor.  Do not use street  drugs.  Do not share needles.  Ask your health care provider for help if you need support or information about quitting drugs.  Tell your health care provider if you often feel depressed.  Tell your health care provider if you have ever been abused or do not feel safe at home. Document Released: 05/12/2011 Document Revised: 03/13/2014 Document Reviewed: 09/28/2013 [facility] Patient Information 2015 [place], [place]. This information is not intended to replace advice given to you by your health care provider. Make sure you discuss any questions you have with your health care provider.

## 2014-09-27 ENCOUNTER — Ambulatory Visit (INDEPENDENT_AMBULATORY_CARE_PROVIDER_SITE_OTHER): Payer: No Typology Code available for payment source | Admitting: *Deleted

## 2014-09-27 ENCOUNTER — Telehealth: Payer: Self-pay | Admitting: Internal Medicine

## 2014-09-27 DIAGNOSIS — Z23 Encounter for immunization: Secondary | ICD-10-CM

## 2014-09-27 LAB — COMPREHENSIVE METABOLIC PANEL
ALT: 20 U/L (ref 0–35)
AST: 22 U/L (ref 0–37)
Albumin: 4 g/dL (ref 3.5–5.2)
Alkaline Phosphatase: 89 U/L (ref 39–117)
BUN: 16 mg/dL (ref 6–23)
CALCIUM: 9.2 mg/dL (ref 8.4–10.5)
CHLORIDE: 102 meq/L (ref 96–112)
CO2: 27 mEq/L (ref 19–32)
CREATININE: 0.7 mg/dL (ref 0.4–1.2)
GFR: 96.23 mL/min (ref >60.00)
Glucose, Bld: 85 mg/dL (ref 70–99)
Potassium: 3.9 mEq/L (ref 3.5–5.1)
Sodium: 139 mEq/L (ref 135–145)
Total Bilirubin: 0.7 mg/dL (ref 0.2–1.2)
Total Protein: 7.3 g/dL (ref 6.0–8.3)

## 2014-09-27 LAB — LIPID PANEL
Cholesterol: 215 mg/dL — ABNORMAL HIGH (ref 0–200)
HDL: 59.3 mg/dL (ref >39.00)
LDL Cholesterol: 141 mg/dL — ABNORMAL HIGH (ref 0–99)
NONHDL: 155.7
Total CHOL/HDL Ratio: 4
Triglycerides: 75 mg/dL (ref 0.0–149.0)
VLDL: 15 mg/dL (ref 0.0–40.0)

## 2014-09-27 LAB — CBC WITH DIFFERENTIAL/PLATELET
BASOS PCT: 1.4 % (ref 0.0–3.0)
Basophils Absolute: 0.1 10*3/uL (ref 0.0–0.1)
Eosinophils Absolute: 0.3 10*3/uL (ref 0.0–0.7)
Eosinophils Relative: 3.6 % (ref 0.0–5.0)
HCT: 39.5 % (ref 36.0–46.0)
HEMOGLOBIN: 13.3 g/dL (ref 12.0–15.0)
LYMPHS ABS: 2.9 10*3/uL (ref 0.7–4.0)
LYMPHS PCT: 36.6 % (ref 12.0–46.0)
MCHC: 33.6 g/dL (ref 30.0–36.0)
MCV: 91.5 fl (ref 78.0–100.0)
MONOS PCT: 4 % (ref 3.0–12.0)
Monocytes Absolute: 0.3 10*3/uL (ref 0.1–1.0)
Neutro Abs: 4.3 10*3/uL (ref 1.4–7.7)
Neutrophils Relative %: 54.4 % (ref 43.0–77.0)
Platelets: 276 10*3/uL (ref 150.0–400.0)
RBC: 4.31 Mil/uL (ref 3.87–5.11)
RDW: 14.1 % (ref 11.5–15.5)
WBC: 7.9 10*3/uL (ref 4.0–10.5)

## 2014-09-27 LAB — TSH: TSH: 2.83 u[IU]/mL (ref 0.35–4.50)

## 2014-09-27 MED ORDER — HYDROCHLOROTHIAZIDE 25 MG PO TABS: 25.0000 mg | ORAL_TABLET | Freq: Every day | ORAL | Status: DC

## 2014-09-27 MED ORDER — MUPIROCIN CALCIUM 2 % EX CREA: 1.0000 "application " | TOPICAL_CREAM | Freq: Two times a day (BID) | CUTANEOUS | Status: DC

## 2014-09-27 MED ORDER — METOPROLOL SUCCINATE ER 100 MG PO TB24: 100.0000 mg | ORAL_TABLET | Freq: Every day | ORAL | Status: DC

## 2014-09-27 NOTE — Telephone Encounter (Signed)
Patient called regarding needs refill on scripts, but  also ask about a medication for staff infection in her nose I did not see anything in your notes. Do you need to review labs before I feel metoprolol and HCTZ?

## 2014-09-27 NOTE — Telephone Encounter (Signed)
Refills and mupirocin antibiotic ointment sent t pharmacy

## 2014-09-27 NOTE — Telephone Encounter (Signed)
Patient aware.

## 2014-09-28 NOTE — Assessment & Plan Note (Signed)
I have addressed  BMI and recommended wt loss of 10% of body weight over the next 6 months using a low glycemic index diet and regular exercise a minimum of 5 days per week.   

## 2014-09-28 NOTE — Assessment & Plan Note (Signed)

## 2014-09-28 NOTE — Assessment & Plan Note (Signed)
Well controlled on current regimen. Renal function stable, no changes today.  Lab Results  Component Value Date   CREATININE 0.7 09/26/2014   Lab Results  Component Value Date   NA 139 09/26/2014   K 3.9 09/26/2014   CL 102 09/26/2014   CO2 27 09/26/2014

## 2014-09-30 ENCOUNTER — Encounter: Payer: Self-pay | Admitting: Internal Medicine

## 2014-10-30 ENCOUNTER — Ambulatory Visit: Payer: Self-pay | Admitting: Internal Medicine

## 2014-10-30 LAB — HM MAMMOGRAPHY: HM Mammogram: NEGATIVE

## 2014-10-31 ENCOUNTER — Encounter: Payer: Self-pay | Admitting: *Deleted

## 2014-11-08 ENCOUNTER — Ambulatory Visit: Payer: No Typology Code available for payment source | Admitting: Internal Medicine

## 2014-11-09 ENCOUNTER — Ambulatory Visit (INDEPENDENT_AMBULATORY_CARE_PROVIDER_SITE_OTHER)
Admission: RE | Admit: 2014-11-09 | Discharge: 2014-11-09 | Disposition: A | Discharge status: Home / Self Care | Payer: No Typology Code available for payment source | Source: Ambulatory Visit | Attending: Internal Medicine | Admitting: Internal Medicine

## 2014-11-09 ENCOUNTER — Encounter: Payer: Self-pay | Admitting: Internal Medicine

## 2014-11-09 ENCOUNTER — Other Ambulatory Visit: Payer: Self-pay | Admitting: *Deleted

## 2014-11-09 ENCOUNTER — Ambulatory Visit (INDEPENDENT_AMBULATORY_CARE_PROVIDER_SITE_OTHER): Payer: No Typology Code available for payment source | Admitting: Internal Medicine

## 2014-11-09 VITALS — BP 130/80 | HR 97 | Temp 98.3°F | Resp 14 | Ht 68.75 in | Wt 284.8 lb

## 2014-11-09 DIAGNOSIS — IMO0001 Reserved for inherently not codable concepts without codable children: Secondary | ICD-10-CM

## 2014-11-09 DIAGNOSIS — S63619A Unspecified sprain of unspecified finger, initial encounter: Secondary | ICD-10-CM

## 2014-11-09 DIAGNOSIS — S66911A Strain of unspecified muscle, fascia and tendon at wrist and hand level, right hand, initial encounter: Secondary | ICD-10-CM

## 2014-11-09 DIAGNOSIS — M7989 Other specified soft tissue disorders: Secondary | ICD-10-CM

## 2014-11-09 MED ORDER — PREDNISONE (PAK) 10 MG PO TABS: ORAL_TABLET | ORAL | Status: DC

## 2014-11-09 NOTE — Patient Instructions (Addendum)
You appear to have  have strained your flexor tendon  I recommend getting a metal finger splint to immobilize the tendon for a few weeks ([facility] and [facility] carry them)  Prednisone taper instead of naproxen for 6 days,   If no improvement in 2 weeks,  Call for hand surgeon referral

## 2014-11-09 NOTE — Progress Notes (Signed)
Patient ID: Amy Petty, female   DOB: 03/18/1956, 58 y.o.   MRN: [id number]   Patient Active Problem List   Diagnosis Date Noted  . Strain of finger of right hand 11/11/2014  . Synovitis of finger 12/08/2012  . Routine general medical examination at a health care facility 08/11/2012  . Peri-menopause 04/19/2012  . Menorrhagia 04/19/2012  . Tendonitis, Achilles, right 03/05/2012  . Screening for cervical cancer 10/20/2011  . Screening for breast cancer 10/20/2011  . Screening for colon cancer 10/20/2011  . Allergic rhinitis 10/20/2011  . Fibroid, uterus   . Anemia   . SVT (supraventricular tachycardia) 08/04/2011  . HTN (hypertension) 08/04/2011  . Obesity 08/04/2011    Subjective:  CC:   Chief Complaint  Patient presents with  . Hand Pain    midde finger on right hand stiff and swollen a little    HPI:   Amy Petty is a 58 y.o. female who presents for evaluation og "trigger finger"   She has been having Pain to palpation of the MCP joint of the middle finger, accompanied by difficulty extending the finger without assistance once the fist is closed. Did some yardwork several days ago which involved pruning and raking leaves. No trauma,  No redness or swelling.    Past Medical History  Diagnosis Date  . Hypertension   . PVC's (premature ventricular contractions)   . SVT (supraventricular tachycardia)   . Obesity   . Hypothyroidism   . Diastolic dysfunction     Grade 1 per echo Oct 2012; EF is normal.  . Fibroid, uterus     with menorrhagia  . Anemia     iron deficiency, due to fibroids    Past Surgical History  Procedure Laterality Date  . Tubal ligation    . Carpal tunnel release    . Tonsillectomy and adenoidectomy    . Carpal tunnel release  2008    right hand. wears  brace on left       The following portions of the patient's history were reviewed and updated as appropriate: Allergies, current medications, and problem list.    Review of  Systems:   Patient denies headache, fevers, malaise, unintentional weight loss, skin rash, eye pain, sinus congestion and sinus pain, sore throat, dysphagia,  hemoptysis , cough, dyspnea, wheezing, chest pain, palpitations, orthopnea, edema, abdominal pain, nausea, melena, diarrhea, constipation, flank pain, dysuria, hematuria, urinary  Frequency, nocturia, numbness, tingling, seizures,  Focal weakness, Loss of consciousness,  Tremor, insomnia, depression, anxiety, and suicidal ideation.     History   Social History  . Marital Status: Married    Spouse Name: N/A    Number of Children: N/A  . Years of Education: N/A   Occupational History  . Not on file.   Social History Main Topics  . Smoking status: Never Smoker   . Smokeless tobacco: Never Used  . Alcohol Use: No  . Drug Use: No  . Sexual Activity: Yes   Other Topics Concern  . Not on file   Social History Narrative    Objective:  Filed Vitals:   11/09/14 1333  BP: 130/80  Pulse: 97  Temp: 98.3 F (36.8 C)  Resp: 14     General appearance: alert, cooperative and appears stated age Ears: normal TM's and external ear canals both ears Throat: lips, mucosa, and tongue normal; teeth and gums normal Neck: no adenopathy, no carotid bruit, supple, symmetrical, trachea midline and thyroid not enlarged, symmetric, no tenderness/mass/nodules  Back: symmetric, no curvature. ROM normal. No CVA tenderness. Lungs: clear to auscultation bilaterally Heart: regular rate and rhythm, S1, S2 normal, no murmur, click, rub or gallop Abdomen: soft, non-tender; bowel sounds normal; no masses,  no organomegaly Pulses: 2+ and symmetric Skin: Skin color, texture, turgor normal. No rashes or lesions Lymph nodes: Cervical, supraclavicular, and axillary nodes normal.  Assessment and Plan:  Strain of finger of right hand predniosne 6 day taper and metal finger splint.  If no improvement in 2 weeks,  Refer to Orthopedics   Updated  Medication List Outpatient Encounter Prescriptions as of 11/09/2014  Medication Sig  . ALPRAZolam (XANAX) 0.5 MG tablet Take 1 tablet (0.5 mg total) by mouth at bedtime as needed for anxiety.  . hydrochlorothiazide (HYDRODIURIL) 25 MG tablet Take 1 tablet (25 mg total) by mouth daily.  . metoprolol succinate (TOPROL-XL) 100 MG 24 hr tablet Take 1 tablet (100 mg total) by mouth daily. Take with or immediately following a meal.  . Multiple Vitamin (MULTIVITAMIN) tablet Take 1 tablet by mouth daily.    . Multiple Vitamins-Minerals (HAIR/SKIN/NAILS PO) Take by mouth daily.    . mupirocin cream (BACTROBAN) 2 % Apply 1 application topically 2 (two) times daily. To inside of nose for one week  . naproxen (NAPROSYN) 500 MG tablet TAKE ONE TABLET BY MOUTH ONE TIME DAILY  . phentermine (ADIPEX-P) 37.5 MG tablet 1/2 tablet in the am and early afternoon  . predniSONE (STERAPRED UNI-PAK) 10 MG tablet 6 tablets on Day 1 , then reduce by 1 tablet daily until gone  . [DISCONTINUED] predniSONE (STERAPRED UNI-PAK) 10 MG tablet 6 tablets on Day 1 , then reduce by 1 tablet daily until gone     Orders Placed This Encounter  Procedures  . DME Other see comment    No Follow-up on file.

## 2014-11-11 ENCOUNTER — Encounter: Payer: Self-pay | Admitting: Internal Medicine

## 2014-11-11 DIAGNOSIS — IMO0002 Reserved for concepts with insufficient information to code with codable children: Secondary | ICD-10-CM | POA: Insufficient documentation

## 2014-11-11 NOTE — Assessment & Plan Note (Signed)
predniosne 6 day taper and metal finger splint.  If no improvement in 2 weeks,  Refer to Orthopedics

## 2014-12-05 ENCOUNTER — Encounter: Payer: Self-pay | Admitting: Internal Medicine

## 2014-12-15 ENCOUNTER — Other Ambulatory Visit: Payer: Self-pay | Admitting: Internal Medicine

## 2015-03-14 ENCOUNTER — Other Ambulatory Visit: Payer: Self-pay | Admitting: Internal Medicine

## 2015-03-14 NOTE — Telephone Encounter (Signed)
Ok refill? 

## 2015-03-15 NOTE — Telephone Encounter (Signed)
Ok to refill,  Refill sent  

## 2015-03-25 ENCOUNTER — Other Ambulatory Visit: Payer: Self-pay | Admitting: Internal Medicine

## 2015-04-03 ENCOUNTER — Encounter: Payer: Self-pay | Admitting: Internal Medicine

## 2015-04-18 ENCOUNTER — Telehealth: Payer: Self-pay | Admitting: Internal Medicine

## 2015-04-18 NOTE — Telephone Encounter (Signed)
[facility] Patient Name: Amy Petty DOB: [birth year] Initial Comment Caller states she had an injury a few weeks ago and might have pulled her right arm Nurse Assessment Nurse: Markus Daft, RN, Sherre Poot Date/Time (Eastern Time): 04/18/2015 10:02:51 AM Confirm and document reason for call. If symptomatic, describe symptoms. ---Caller states she had an injury a few weeks ago and might have pulled her right arm when she tripped at school, and put all her pressure on the arm to keep her from falling. She did not report it as work injury and does not plan to as she works at [employer]. Still having moderate pain. She can't lay on that side at night. Has the patient traveled out of the country within the last 30 days? ---Not Applicable Does the patient require triage? ---Yes Related visit to physician within the last 2 weeks? ---No Does the PT have any chronic conditions? (i.e. diabetes, asthma, etc.) ---Yes List chronic conditions. ---Carpal tunnel, tachycardia - on beta blocker Guidelines Guideline Title Affirmed Question Affirmed Notes Arm Injury Can't move injured arm normally (bend or straighten completely) can't lift with that arm now Final Disposition User See Physician within [place], RN, [place] Comments RN unable to access the EMR to schedule. Caller will call office within 5 minutes to set up appt for today. RN rebooted system, and able to get into EMR now, and copied info. into Epic now.

## 2015-04-18 NOTE — Telephone Encounter (Signed)
FYI, pt has appoint with you on 6.9.16.

## 2015-04-19 ENCOUNTER — Ambulatory Visit: Payer: No Typology Code available for payment source | Admitting: Nurse Practitioner

## 2015-04-20 ENCOUNTER — Ambulatory Visit (INDEPENDENT_AMBULATORY_CARE_PROVIDER_SITE_OTHER): Payer: No Typology Code available for payment source | Admitting: Nurse Practitioner

## 2015-04-20 VITALS — BP 118/80 | HR 79 | Temp 98.2°F | Resp 16 | Ht 68.75 in | Wt 279.8 lb

## 2015-04-20 DIAGNOSIS — M659 Synovitis and tenosynovitis, unspecified: Secondary | ICD-10-CM

## 2015-04-20 DIAGNOSIS — M79621 Pain in right upper arm: Secondary | ICD-10-CM

## 2015-04-20 MED ORDER — TRAMADOL HCL 50 MG PO TABS: 50.0000 mg | ORAL_TABLET | Freq: Three times a day (TID) | ORAL | Status: DC | PRN

## 2015-04-20 MED ORDER — CYCLOBENZAPRINE HCL 10 MG PO TABS: 10.0000 mg | ORAL_TABLET | Freq: Three times a day (TID) | ORAL | Status: DC | PRN

## 2015-04-20 NOTE — Progress Notes (Signed)
Pre visit review using our clinic review tool, if applicable. No additional management support is needed unless otherwise documented below in the visit note. 

## 2015-04-20 NOTE — Progress Notes (Signed)
   Subjective:    Patient ID: MOSELLA KASA, female    DOB: 1956-05-23, 59 y.o.   MRN: [id number]  HPI  Ms. Hoelting is a 59 yo female with a 59 yo female with a CC of Right arm pain 2.5 weeks ago.   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 She put all of her weight on the arm and hurts into bicep.  Heart burn- Taking a lot of NSAIDs  Tylenol also for this, no other treatment to date.   Review of Systems  Constitutional: Negative for fever, chills, diaphoresis and fatigue.  Respiratory: Negative for chest tightness, shortness of breath and wheezing.   Cardiovascular: Negative for chest pain, palpitations and leg swelling.  Gastrointestinal: Negative for nausea, vomiting and diarrhea.  Musculoskeletal: Positive for myalgias and arthralgias.  Skin: Negative for rash.  Neurological: Negative for dizziness, weakness, numbness and headaches.  Psychiatric/Behavioral: The patient is not nervous/anxious.       Objective:   Physical Exam  Constitutional: She is oriented to person, place, and time. She appears well-developed and well-nourished. No distress.  BP 118/80 mmHg  Pulse 79  Temp(Src) 98.2 F (36.8 C)  Resp 16  Ht 5' 8.75" (1.746 m)  Wt 279 lb 12.8 oz (126.916 kg)  BMI 41.63 kg/m2  SpO2 95%   HENT:  Head: Normocephalic and atraumatic.  Right Ear: External ear normal.  Left Ear: External ear normal.  Cardiovascular: Normal rate, regular rhythm, normal heart sounds and intact distal pulses.  Exam reveals no gallop and no friction rub.   No murmur heard. Pulmonary/Chest: Effort normal and breath sounds normal. No respiratory distress. She has no wheezes. She has no rales. She exhibits no tenderness.  Musculoskeletal: She exhibits tenderness. She exhibits no edema.  Decreased ROM on right due to pain. Tender biceps at insertion site. No loss of strength, 5/5 bicep, tricep, and deltoid  Neurological: She is alert and oriented to person, place, and time. No cranial nerve  deficit. She exhibits normal muscle tone. Coordination normal.  Skin: Skin is warm and dry. No rash noted. She is not diaphoretic.  Psychiatric: She has a normal mood and affect. Her behavior is normal. Judgment and thought content normal.      Assessment & Plan:

## 2015-04-29 ENCOUNTER — Encounter: Payer: Self-pay | Admitting: Nurse Practitioner

## 2015-04-29 DIAGNOSIS — M7521 Bicipital tendinitis, right shoulder: Secondary | ICD-10-CM | POA: Insufficient documentation

## 2015-04-29 NOTE — Assessment & Plan Note (Signed)
Requesting referral to Ortho. Also, has chronic synovitis of a finger. Tramadol given for pain, RICE recommended.

## 2015-04-29 NOTE — Assessment & Plan Note (Signed)
Pt would like referral for this as well to ortho. On right hand chronic

## 2015-06-20 ENCOUNTER — Other Ambulatory Visit: Payer: Self-pay | Admitting: Internal Medicine

## 2015-06-20 NOTE — Telephone Encounter (Signed)
Last OV 6.10.16 with Morey Hummingbird, next OV 11.21.16. Please advise refill

## 2015-06-21 NOTE — Telephone Encounter (Signed)
Denied,  Needs to have every 3 month follow up on pharamacologic management of weights, and the last time I saw her about this was in November.  Will need to make appt

## 2015-06-21 NOTE — Telephone Encounter (Signed)
Last OV was 6.10.16 with Morey Hummingbird, next OV is 11.21.16. Please advise refill.

## 2015-06-22 NOTE — Telephone Encounter (Signed)
Ronalee Belts [facility] called. Informed rx was denied. Ronalee Belts will inform pt of same.

## 2015-07-05 ENCOUNTER — Other Ambulatory Visit: Payer: Self-pay | Admitting: Internal Medicine

## 2015-07-05 NOTE — Telephone Encounter (Signed)
Last OV and refill 11.18.15.  Please advise refill

## 2015-07-05 NOTE — Telephone Encounter (Signed)
90 day supply authorized and sent . patient needs hypertension follow up visit

## 2015-07-14 ENCOUNTER — Other Ambulatory Visit: Payer: Self-pay | Admitting: Internal Medicine

## 2015-07-16 NOTE — Telephone Encounter (Signed)
Last OV 6.10.16.  Please advise refil

## 2015-07-16 NOTE — Telephone Encounter (Signed)
Ok to refill,  Authorized in epic and sent  

## 2015-08-01 ENCOUNTER — Telehealth: Payer: Self-pay | Admitting: Internal Medicine

## 2015-08-01 NOTE — Telephone Encounter (Signed)
Pt called back she is scheduled for Oct 7 but wants something sooner. Pt right arm and buttocks is hurting her still. Pt states it hurts when she walks up the steps. Thank You!

## 2015-08-01 NOTE — Telephone Encounter (Signed)
Pt only wanted to see Dr Cecille Rubin spoke with pt and scheduled her 9.26.16.

## 2015-08-03 ENCOUNTER — Encounter: Payer: Self-pay | Admitting: Emergency Medicine

## 2015-08-03 ENCOUNTER — Other Ambulatory Visit: Payer: Self-pay

## 2015-08-03 ENCOUNTER — Telehealth: Payer: Self-pay | Admitting: Cardiology

## 2015-08-03 ENCOUNTER — Emergency Department
Admission: EM | Admit: 2015-08-03 | Discharge: 2015-08-03 | Disposition: A | Discharge status: Home / Self Care | Payer: No Typology Code available for payment source | Attending: Emergency Medicine | Admitting: Emergency Medicine

## 2015-08-03 DIAGNOSIS — R000 Tachycardia, unspecified: Secondary | ICD-10-CM | POA: Diagnosis present

## 2015-08-03 DIAGNOSIS — Z79899 Other long term (current) drug therapy: Secondary | ICD-10-CM | POA: Diagnosis not present

## 2015-08-03 DIAGNOSIS — Z791 Long term (current) use of non-steroidal anti-inflammatories (NSAID): Secondary | ICD-10-CM | POA: Diagnosis not present

## 2015-08-03 DIAGNOSIS — I471 Supraventricular tachycardia: Secondary | ICD-10-CM | POA: Diagnosis not present

## 2015-08-03 DIAGNOSIS — I10 Essential (primary) hypertension: Secondary | ICD-10-CM | POA: Diagnosis not present

## 2015-08-03 DIAGNOSIS — Z7952 Long term (current) use of systemic steroids: Secondary | ICD-10-CM | POA: Diagnosis not present

## 2015-08-03 DIAGNOSIS — Z880 Allergy status to penicillin: Secondary | ICD-10-CM | POA: Insufficient documentation

## 2015-08-03 LAB — COMPREHENSIVE METABOLIC PANEL
ALBUMIN: 4.2 g/dL (ref 3.5–5.0)
ALT: 20 U/L (ref 14–54)
AST: 29 U/L (ref 15–41)
Alkaline Phosphatase: 88 U/L (ref 38–126)
Anion gap: 13 (ref 5–15)
BILIRUBIN TOTAL: 0.3 mg/dL (ref 0.3–1.2)
BUN: 21 mg/dL — AB (ref 6–20)
CHLORIDE: 100 mmol/L — AB (ref 101–111)
CO2: 27 mmol/L (ref 22–32)
Calcium: 9.4 mg/dL (ref 8.9–10.3)
Creatinine, Ser: 0.88 mg/dL (ref 0.44–1.00)
GFR calc Af Amer: >60 mL/min (ref >60)
GFR calc non Af Amer: >60 mL/min (ref >60)
GLUCOSE: 109 mg/dL — AB (ref 65–99)
POTASSIUM: 3.7 mmol/L (ref 3.5–5.1)
Sodium: 140 mmol/L (ref 135–145)
TOTAL PROTEIN: 7.7 g/dL (ref 6.5–8.1)

## 2015-08-03 LAB — CBC WITH DIFFERENTIAL/PLATELET
Basophils Absolute: 0 10*3/uL (ref 0–0.1)
Basophils Relative: 0 %
EOS PCT: 3 %
Eosinophils Absolute: 0.3 10*3/uL (ref 0–0.7)
HCT: 44.6 % (ref 35.0–47.0)
Hemoglobin: 14.6 g/dL (ref 12.0–16.0)
LYMPHS ABS: 4.5 10*3/uL — AB (ref 1.0–3.6)
LYMPHS PCT: 37 %
MCH: 30.5 pg (ref 26.0–34.0)
MCHC: 32.7 g/dL (ref 32.0–36.0)
MCV: 93.3 fL (ref 80.0–100.0)
MONO ABS: 0.8 10*3/uL (ref 0.2–0.9)
Monocytes Relative: 7 %
Neutro Abs: 6.5 10*3/uL (ref 1.4–6.5)
Neutrophils Relative %: 53 %
PLATELETS: 332 10*3/uL (ref 150–440)
RBC: 4.79 MIL/uL (ref 3.80–5.20)
RDW: 14.5 % (ref 11.5–14.5)
WBC: 12.2 10*3/uL — ABNORMAL HIGH (ref 3.6–11.0)

## 2015-08-03 MED ORDER — SODIUM CHLORIDE 0.9 % IV BOLUS (SEPSIS)
1000.0000 mL | Freq: Once | INTRAVENOUS | Status: AC
  Administered 2015-08-03: 1000 mL via INTRAVENOUS

## 2015-08-03 MED ORDER — ADENOSINE 6 MG/2ML IV SOLN
INTRAVENOUS | Status: AC
  Administered 2015-08-03: 6 mg
  Filled 2015-08-03: qty 2

## 2015-08-03 MED ORDER — ADENOSINE 12 MG/4ML IV SOLN
INTRAVENOUS | Status: AC
  Administered 2015-08-03: 12 mg
  Filled 2015-08-03: qty 4

## 2015-08-03 NOTE — Telephone Encounter (Signed)
Pt's husband called in stating that she has been having a tachycardiac episode since noon and he would like to be advised on what to do. Please f/u. She is a former pt of Dr. Doug Sou  Thanks

## 2015-08-03 NOTE — Telephone Encounter (Signed)
Agree she needs to be seen in the ED.

## 2015-08-03 NOTE — ED Provider Notes (Addendum)
[facility] Emergency Department [place] Note  Time seen: 3:03 PM  I have reviewed the triage vital signs and the nursing notes.   HISTORY  Chief Complaint No chief complaint on file.    HPI Amy Petty is a 59 y.o. female with a past medical history of hypertension, hypothyroidism, anemia, SVT, present emergency department with fast heart rate and chest tightness. According to the patient at 12 PM she developed a racing heartbeat consistent with previous episodes of SVT as well as chest tightness. She tried vagal maneuvers at home without relief so she came to the emergency department. Upon arrival to the emergency department patient is a heart rate of 190 bpm. States chest tightness but denies any "pain." Denies any shortness of breath currently, however she states if she exerts or walks she becomes very short of breath. Does state mild dizziness currently.     Past Medical History  Diagnosis Date  . Hypertension   . PVC's (premature ventricular contractions)   . SVT (supraventricular tachycardia)   . Obesity   . Hypothyroidism   . Diastolic dysfunction     Grade 1 per echo Oct 2012; EF is normal.  . Fibroid, uterus     with menorrhagia  . Anemia     iron deficiency, due to fibroids    Patient Active Problem List   Diagnosis Date Noted  . Pain of right upper arm 04/29/2015  . Strain of finger of right hand 11/11/2014  . Synovitis of finger 12/08/2012  . Routine general medical examination at a health care facility 08/11/2012  . Peri-menopause 04/19/2012  . Menorrhagia 04/19/2012  . Tendonitis, Achilles, right 03/05/2012  . Screening for cervical cancer 10/20/2011  . Screening for breast cancer 10/20/2011  . Screening for colon cancer 10/20/2011  . Allergic rhinitis 10/20/2011  . Fibroid, uterus   . Anemia   . SVT (supraventricular tachycardia) 08/04/2011  . HTN (hypertension) 08/04/2011  . Obesity 08/04/2011    Past Surgical History   Procedure Laterality Date  . Tubal ligation    . Carpal tunnel release    . Tonsillectomy and adenoidectomy    . Carpal tunnel release  2008    right hand. wears  brace on left    Current Outpatient Rx  Name  Route  Sig  Dispense  Refill  . ALPRAZolam (XANAX) 0.5 MG tablet   Oral   Take 1 tablet (0.5 mg total) by mouth at bedtime as needed for anxiety.   30 tablet   3   . cyclobenzaprine (FLEXERIL) 10 MG tablet   Oral   Take 1 tablet (10 mg total) by mouth 3 (three) times daily as needed for muscle spasms.   30 tablet   0   . hydrochlorothiazide (HYDRODIURIL) 25 MG tablet      TAKE ONE TABLET BY MOUTH ONE TIME DAILY   90 tablet   1   . metoprolol succinate (TOPROL-XL) 100 MG 24 hr tablet      TAKE ONE TABLET BY MOUTH ONE TIME DAILY WITH FOOD OR IMMEDIATELY FOLLOWING A MEAL   90 tablet   0   . Multiple Vitamin (MULTIVITAMIN) tablet   Oral   Take 1 tablet by mouth daily.           . Multiple Vitamins-Minerals (HAIR/SKIN/NAILS PO)   Oral   Take by mouth daily.           . mupirocin cream (BACTROBAN) 2 %   Topical  Apply 1 application topically 2 (two) times daily. To inside of nose for one week Patient not taking: Reported on 04/20/2015   15 g   2   . naproxen (NAPROSYN) 500 MG tablet      TAKE ONE TABLET BY MOUTH ONE TIME DAILY   30 tablet   3   . phentermine (ADIPEX-P) 37.5 MG tablet      1/2 tablet in the am and early afternoon   30 tablet   2   . predniSONE (STERAPRED UNI-PAK) 10 MG tablet      6 tablets on Day 1 , then reduce by 1 tablet daily until gone Patient not taking: Reported on 04/20/2015   21 tablet   0   . traMADol (ULTRAM) 50 MG tablet   Oral   Take 1 tablet (50 mg total) by mouth every 8 (eight) hours as needed.   30 tablet   0     Allergies Penicillins and Sulfonamide derivatives  Family History  Problem Relation Age of Onset  . Cancer Mother     Lung Ca second hand smoke  . Lung cancer Mother   . Cancer Father      liver and prostate Ca  . Liver cancer Father   . Diabetes Sister     Social History Social History  Substance Use Topics  . Smoking status: Never Smoker   . Smokeless tobacco: Never Used  . Alcohol Use: No    Review of Systems Constitutional: Negative for fever. Positive for dizziness. Cardiovascular: Positive for chest tightness. Respiratory: Short of breath with exertion. Gastrointestinal: Negative for abdominal pain Neurological: Negative for headache 10-point ROS otherwise negative.  ____________________________________________   PHYSICAL EXAM:  Constitutional: Alert and oriented. Well appearing and in no distress. Eyes: Normal exam Cardiovascular: Regular rhythm, rate around 200 bpm. Respiratory: Normal respiratory effort without tachypnea nor retractions. Breath sounds are clear  Gastrointestinal: Soft and nontender. No distention.   Musculoskeletal: Nontender with normal range of motion in all extremities. No lower extremity tenderness or edema. Neurologic:  Normal speech and language. No gross focal neurologic deficits  Psychiatric: Mood and affect are normal. Speech and behavior are normal.  ____________________________________________    EKG  EKG reviewed and interpreted by myself 14:32:58 shows supraventricular tachycardia 191 bpm, narrow QRS, normal axis, nonspecific ST changes present. Mild ST depressions in lateral leads.  EKG reviewed and interpreted by myself 14:48:50 shows sinus tachycardia 105 bpm, narrow QRS, normal axis, normal intervals, ST depressions have vastly improved, nonspecific changes still present, no elevations noted.  ____________________________________________   CRITICAL CARE Performed by: Harvest Dark   Total critical care time: 30 minutes  Critical care time was exclusive of separately billable procedures and treating other patients.  Critical care was necessary to treat or prevent imminent or life-threatening  deterioration.  Critical care was time spent personally by me on the following activities: development of treatment plan with patient and/or surrogate as well as nursing, discussions with consultants, evaluation of patient's response to treatment, examination of patient, obtaining history from patient or surrogate, ordering and performing treatments and interventions, ordering and review of laboratory studies, ordering and review of radiographic studies, pulse oximetry and re-evaluation of patient's condition.    INITIAL IMPRESSION / ASSESSMENT AND PLAN / ED COURSE  Pertinent labs & imaging results that were available during my care of the patient were reviewed by me and considered in my medical decision making (see chart for details).  Patient says the emergency department with  chest tightness, heart racing palpitations. Patient has SVT on EKG. Vagal maneuvers attempted without response. 6 mg with adenosine administered without response. 12 mg of adenosine administered with return to normal sinus rhythm. We will check basic labs. The patient has a cardiologist at [facility], they have discussed ablation in the past, and the patient will make an appointment with the cardiologist to discuss this once again. The patient is on metoprolol for rate control which she states she has been taking. Blood pressure is currently in the 824M systolic following conversion.  Labs are largely within normal limits. Patient remains in normal sinus rhythm. We will discharge home with cardiology follow-up. Patient agreeable to plan. ____________________________________________   FINAL CLINICAL IMPRESSION(S) / ED DIAGNOSES  Supraventricular tachycardia   Harvest Dark, MD 08/03/15 [id number]  Harvest Dark, MD 08/03/15 1538

## 2015-08-03 NOTE — ED Notes (Signed)
Pt reports being at work and suddenly became light-headed and having diaphoresis.  Pt has history of SVT.  Pt arrived to ED waiting room  Via personal vehicle with husband with HR in 190's.  Pt brought straight back to room 18.

## 2015-08-03 NOTE — Discharge Instructions (Signed)
Supraventricular Tachycardia °Supraventricular tachycardia (SVT) is when the heart beats very fast. SVT can last for a long time (sustained) or it can start and stop suddenly (nonsustained). °HOME CARE  °· Take your heart medicine as told by your doctor. Check with your doctor before taking cold, diet, or herbal medicine. °· Do not smoke. °· Do not drink large amounts of caffeine. Caffeine is found in coffee, tea, soda (pop, cola), and chocolate.  °· Keep all doctor visits as told. °GET HELP RIGHT AWAY IF:  °· You have chest pain or pressure. °· You cannot catch your breath. °· You are dizzy or lightheaded. °· You feel like you will pass out (faint). °· You are sweaty (diaphoretic) and feel sick to your stomach (nauseous) or throw up (vomit).   °If you have the above problems, call your local emergency services (911 in [place] right away. Do not drive yourself to the hospital. °MAKE SURE YOU:  °· Understand these instructions. °· Will watch your condition. °· Will get help right away if you are not doing well or get worse. °Document Released: 10/27/2005 Document Revised: 01/19/2012 Document Reviewed: 01/31/2009 °ExitCare® Patient Information ©2015 [employer]. This information is not intended to replace advice given to you by your health care provider. Make sure you discuss any questions you have with your health care provider. ° °

## 2015-08-03 NOTE — Telephone Encounter (Signed)
Patients husband called and he said his wife's heart rate is running 170 and her b/p is running very low.  She is lying down right now.  Has some chest pressure  Light headed  Advised husband to call 9-1-1 because her heart rate is too fast and she is having symptoms of it being low.  Lives in [place]

## 2015-08-06 ENCOUNTER — Encounter: Payer: Self-pay | Admitting: Internal Medicine

## 2015-08-06 ENCOUNTER — Ambulatory Visit (INDEPENDENT_AMBULATORY_CARE_PROVIDER_SITE_OTHER): Payer: No Typology Code available for payment source | Admitting: Internal Medicine

## 2015-08-06 VITALS — BP 128/88 | HR 74 | Temp 98.4°F | Resp 12 | Ht 69.0 in | Wt 285.1 lb

## 2015-08-06 DIAGNOSIS — M7072 Other bursitis of hip, left hip: Secondary | ICD-10-CM

## 2015-08-06 DIAGNOSIS — I10 Essential (primary) hypertension: Secondary | ICD-10-CM

## 2015-08-06 DIAGNOSIS — M7521 Bicipital tendinitis, right shoulder: Secondary | ICD-10-CM | POA: Diagnosis not present

## 2015-08-06 DIAGNOSIS — I471 Supraventricular tachycardia: Secondary | ICD-10-CM

## 2015-08-06 DIAGNOSIS — R05 Cough: Secondary | ICD-10-CM

## 2015-08-06 DIAGNOSIS — R059 Cough, unspecified: Secondary | ICD-10-CM

## 2015-08-06 DIAGNOSIS — E669 Obesity, unspecified: Secondary | ICD-10-CM

## 2015-08-06 MED ORDER — PREDNISONE 10 MG PO TABS: ORAL_TABLET | ORAL | Status: DC

## 2015-08-06 MED ORDER — OMEPRAZOLE 40 MG PO CPDR: 40.0000 mg | DELAYED_RELEASE_CAPSULE | Freq: Every day | ORAL | Status: DC

## 2015-08-06 MED ORDER — TRAMADOL HCL 50 MG PO TABS: 50.0000 mg | ORAL_TABLET | Freq: Three times a day (TID) | ORAL | Status: DC | PRN

## 2015-08-06 NOTE — Progress Notes (Signed)
G  Subjective:  Patient ID: Amy Petty, female    DOB: 02/18/1956  Age: 59 y.o. MRN: [id number]  CC: The primary encounter diagnosis was SVT (supraventricular tachycardia). Diagnoses of Biceps tendonitis on right, Ischial bursitis of left side, Cough, Essential hypertension, and Obesity were also pertinent to this visit.  HPI Amy Petty presents for evaluation of multiple issues:   1) ER follow up.  She was treated  In the [facility] ER on Sept 23rd for SVT.  Symptom of racing heartbeat had been present for 3 hours and was accompanied by chest tightness and dyspnea with any exertion.    Initial pulse was 190 bpm SVT.  Initial EKg confirmed SVT , narrow QRS, normal axis and mild ST depression in lateral leads.  Vagal maneuvers were unsusscessful in producing a response, followed by administration of 6 mg adenosine IV which also failed.  A dose of 12 mg adenosine succeeded in restoring normal sinus rhythm.  Post procedure BP was 706 sustolic and she was discharged on same home medications which included Toprol XL 100 mg, advised to follow up With her cardiologist to suggest ablation. However, her cardiologist is in [place] ([place]) and she would like to have a cardiologist closer to home, in [place].  She has NOT taken phentermine in over 6 months . Screenign labs including CBC  and lytes were normal.   2) left buttock pain.  States that for the past two weeks it feels like she is sitting on a rock .First noticed it during a prolonged car ride of several hours.  The pain is aggravated by sitting, and walking on an incline.  The pain does not radiate. At times, with cllimbing stairs, the pain feels more like a tight band around the top of the thigh/inguinal border.  There is no history of DVT, gluteal injections, fall, or skin eruption.  No fevers, no leg swelling.  She has tried taking naproxen or motrin with no significant change in symptoms.   3) Right upper arm pain. Patient has been having pain in the  anterior superior portion of her right arm that is aggravated by flexion at the elbow . Pain has been present for 3 months.  She has a history of overuse,  And often picks up her 81 month old grandson with that arm.   4) Cough.  Patient has had a nonproductive cough for two weeks,  Without fevers,  Wheezing and sneezing or post nasal drip.   Outpatient Prescriptions Prior to Visit  Medication Sig Dispense Refill  . hydrochlorothiazide (HYDRODIURIL) 25 MG tablet TAKE ONE TABLET BY MOUTH ONE TIME DAILY 90 tablet 1  . metoprolol succinate (TOPROL-XL) 100 MG 24 hr tablet TAKE ONE TABLET BY MOUTH ONE TIME DAILY WITH FOOD OR IMMEDIATELY FOLLOWING A MEAL 90 tablet 0  . Multiple Vitamin (MULTIVITAMIN) tablet Take 1 tablet by mouth daily.      . Multiple Vitamins-Minerals (HAIR/SKIN/NAILS PO) Take by mouth daily.      . naproxen (NAPROSYN) 500 MG tablet TAKE ONE TABLET BY MOUTH ONE TIME DAILY 30 tablet 3  . ALPRAZolam (XANAX) 0.5 MG tablet Take 1 tablet (0.5 mg total) by mouth at bedtime as needed for anxiety. (Patient not taking: Reported on 08/06/2015) 30 tablet 3  . cyclobenzaprine (FLEXERIL) 10 MG tablet Take 1 tablet (10 mg total) by mouth 3 (three) times daily as needed for muscle spasms. (Patient not taking: Reported on 08/06/2015) 30 tablet 0  . mupirocin cream (BACTROBAN) 2 %  Apply 1 application topically 2 (two) times daily. To inside of nose for one week (Patient not taking: Reported on 04/20/2015) 15 g 2  . phentermine (ADIPEX-P) 37.5 MG tablet 1/2 tablet in the am and early afternoon (Patient not taking: Reported on 08/06/2015) 30 tablet 2  . predniSONE (STERAPRED UNI-PAK) 10 MG tablet 6 tablets on Day 1 , then reduce by 1 tablet daily until gone (Patient not taking: Reported on 04/20/2015) 21 tablet 0  . traMADol (ULTRAM) 50 MG tablet Take 1 tablet (50 mg total) by mouth every 8 (eight) hours as needed. (Patient not taking: Reported on 08/06/2015) 30 tablet 0   No facility-administered medications  prior to visit.    Review of Systems;  Patient denies headache, fevers, malaise, unintentional weight loss, skin rash, eye pain, sinus congestion and sinus pain, sore throat, dysphagia,  hemoptysis ,  dyspnea, wheezing, chest pain,  orthopnea, edema, abdominal pain, nausea, melena, diarrhea, constipation, flank pain, dysuria, hematuria, urinary  Frequency, nocturia, numbness, tingling, seizures,  Focal weakness, Loss of consciousness,  Tremor, insomnia, depression, anxiety, and suicidal ideation.      Objective:  BP 128/88 mmHg  Pulse 74  Temp(Src) 98.4 F (36.9 C) (Oral)  Resp 12  Ht 5\' 9"  (1.753 m)  Wt 285 lb 2 oz (129.332 kg)  BMI 42.09 kg/m2  SpO2 94%  LMP  (Within Years)  BP Readings from Last 3 Encounters:  08/06/15 128/88  08/03/15 112/76  04/20/15 118/80    Wt Readings from Last 3 Encounters:  08/06/15 285 lb 2 oz (129.332 kg)  08/03/15 280 lb (127.007 kg)  04/20/15 279 lb 12.8 oz (126.916 kg)    General appearance: alert, cooperative and appears stated age Neck: no adenopathy, no carotid bruit, supple, symmetrical, trachea midline and thyroid not enlarged, symmetric, no tenderness/mass/nodules. Throat normal without erythema.  Lungs: clear to auscultation bilaterally Heart: regular rate and rhythm, S1, S2 normal, no murmur, click, rub or gallop Abdomen: soft, non-tender; bowel sounds normal; no masses,  no organomegaly Pulses: 2+ and symmetric Skin: Skin color, texture, turgor normal. No rashes or lesions MSK: right biceps tendon tender to palpation at superior portio of bicipital groove; pain elicited with resisted flexion. Left buttock tender over ischial tuberosity. No masses, dimpling or erythema.  Lymph nodes: Cervical, supraclavicular, and axillary nodes normal.  Lab Results  Component Value Date   HGBA1C 5.6 08/15/2013    Lab Results  Component Value Date   CREATININE 0.88 08/03/2015   CREATININE 0.7 09/26/2014   CREATININE 0.6 08/15/2013    Lab  Results  Component Value Date   WBC 12.2* 08/03/2015   HGB 14.6 08/03/2015   HCT 44.6 08/03/2015   PLT 332 08/03/2015   GLUCOSE 109* 08/03/2015   CHOL 215* 09/26/2014   TRIG 75.0 09/26/2014   HDL 59.30 09/26/2014   LDLCALC 141* 09/26/2014   ALT 20 08/03/2015   AST 29 08/03/2015   NA 140 08/03/2015   K 3.7 08/03/2015   CL 100* 08/03/2015   CREATININE 0.88 08/03/2015   BUN 21* 08/03/2015   CO2 27 08/03/2015   TSH 2.83 09/26/2014   HGBA1C 5.6 08/15/2013    No results found.  Assessment & Plan:   Problem List Items Addressed This Visit    SVT (supraventricular tachycardia) - Primary    Recurrent, requiring adenosine to convert.  Referral to [facility] for consideration of ablation, given the presence of [facility] in [place], which is closer to her home in [place]  Relevant Orders   Ambulatory referral to Cardiology   HTN (hypertension)    Well controlled on current regimen. Renal function stable, no changes today.  Lab Results  Component Value Date   CREATININE 0.88 08/03/2015   Lab Results  Component Value Date   NA 140 08/03/2015   K 3.7 08/03/2015   CL 100* 08/03/2015   CO2 27 08/03/2015         Obesity    I have addressed  BMI and recommended wt loss of 10% of body weight over the next 6 months using a low glycemic index diet and regular exercise a minimum of 5 days per week.        Biceps tendonitis on right    Based on exam .  Recommend rest of bicep by avoidance of lifting , alternative carriage of heavy purse (use rolling suitcase) and prednisone taper       Ischial bursitis of left side   Cough    Given that she is already treating self for allergic rhinitis,  Will add treatment for GERD.  If no improvement in one month,  Chest x ray.         A total of 40 minutes was spent with patient more than half of which was spent in counseling patient on the above mentioned issues , reviewing ER records and explaining recent labs and imaging  studies done, and coordination of care. I have discontinued Ms. Sayed ALPRAZolam, phentermine, mupirocin cream, predniSONE, cyclobenzaprine, traMADol, and naproxen. I am also having her start on predniSONE, traMADol, and omeprazole. Additionally, I am having her maintain her multivitamin, Multiple Vitamins-Minerals (HAIR/SKIN/NAILS PO), hydrochlorothiazide, metoprolol succinate, and Melatonin.  Meds ordered this encounter  Medications  . Melatonin 2.5 MG CHEW    Sig: Chew 1 tablet by mouth at bedtime.  . predniSONE (DELTASONE) 10 MG tablet    Sig: 6 tablets on Day 1 , then reduce by 1 tablet daily until gone    Dispense:  21 tablet    Refill:  0  . traMADol (ULTRAM) 50 MG tablet    Sig: Take 1 tablet (50 mg total) by mouth every 8 (eight) hours as needed.    Dispense:  30 tablet    Refill:  0  . omeprazole (PRILOSEC) 40 MG capsule    Sig: Take 1 capsule (40 mg total) by mouth daily.    Dispense:  30 capsule    Refill:  3    Medications Discontinued During This Encounter  Medication Reason  . predniSONE (STERAPRED UNI-PAK) 10 MG tablet   . phentermine (ADIPEX-P) 37.5 MG tablet   . predniSONE (STERAPRED UNI-PAK) 10 MG tablet   . cyclobenzaprine (FLEXERIL) 10 MG tablet   . ALPRAZolam (XANAX) 0.5 MG tablet   . cyclobenzaprine (FLEXERIL) 10 MG tablet   . phentermine (ADIPEX-P) 37.5 MG tablet   . ALPRAZolam (XANAX) 0.5 MG tablet   . mupirocin cream (BACTROBAN) 2 %   . cyclobenzaprine (FLEXERIL) 10 MG tablet   . traMADol (ULTRAM) 50 MG tablet   . phentermine (ADIPEX-P) 37.5 MG tablet   . predniSONE (STERAPRED UNI-PAK) 10 MG tablet   . naproxen (NAPROSYN) 500 MG tablet     Follow-up: Return in about 4 weeks (around 09/03/2015).   Crecencio Mc, MD

## 2015-08-06 NOTE — Progress Notes (Signed)
Pre-visit discussion using our clinic review tool. No additional management support is needed unless otherwise documented below in the visit note.  

## 2015-08-06 NOTE — Patient Instructions (Signed)
I am treating you for bursitis of the left  ischial tuberosity  And right biceps tendonitiswith a prredisone taper and tramadolas needed for pain  LOSE THE  45 LB PURSE!!!!  Omeprazole 40 mg daily in the morning to see if cough imporves  Delay the lfu vaccine for 2 weeks    referral to [facility] cardiology since they have a hospital in [facility]

## 2015-08-07 ENCOUNTER — Encounter: Payer: Self-pay | Admitting: Internal Medicine

## 2015-08-07 DIAGNOSIS — M7072 Other bursitis of hip, left hip: Secondary | ICD-10-CM | POA: Insufficient documentation

## 2015-08-07 DIAGNOSIS — R059 Cough, unspecified: Secondary | ICD-10-CM | POA: Insufficient documentation

## 2015-08-07 DIAGNOSIS — R051 Acute cough: Secondary | ICD-10-CM | POA: Insufficient documentation

## 2015-08-07 DIAGNOSIS — R05 Cough: Secondary | ICD-10-CM | POA: Insufficient documentation

## 2015-08-07 NOTE — Assessment & Plan Note (Signed)
Given that she is already treating self for allergic rhinitis,  Will add treatment for GERD.  If no improvement in one month,  Chest x ray.

## 2015-08-07 NOTE — Assessment & Plan Note (Signed)
Recurrent, requiring adenosine to convert.  Referral to [facility] for consideration of ablation, given the presence of [facility] in [place], which is closer to her home in [place]

## 2015-08-07 NOTE — Assessment & Plan Note (Signed)
Based on exam .  Recommend rest of bicep by avoidance of lifting , alternative carriage of heavy purse (use rolling suitcase) and prednisone taper

## 2015-08-07 NOTE — Assessment & Plan Note (Signed)
Well controlled on current regimen. Renal function stable, no changes today.  Lab Results  Component Value Date   CREATININE 0.88 08/03/2015   Lab Results  Component Value Date   NA 140 08/03/2015   K 3.7 08/03/2015   CL 100* 08/03/2015   CO2 27 08/03/2015

## 2015-08-07 NOTE — Assessment & Plan Note (Signed)
I have addressed  BMI and recommended wt loss of 10% of body weight over the next 6 months using a low glycemic index diet and regular exercise a minimum of 5 days per week.   

## 2015-08-07 NOTE — Telephone Encounter (Signed)
Can this encounter be closed?

## 2015-08-13 ENCOUNTER — Telehealth: Payer: Self-pay | Admitting: Internal Medicine

## 2015-08-13 MED ORDER — GABAPENTIN 100 MG PO CAPS: 100.0000 mg | ORAL_CAPSULE | Freq: Three times a day (TID) | ORAL | Status: DC

## 2015-08-13 MED ORDER — PREDNISONE 10 MG PO TABS: ORAL_TABLET | ORAL | Status: DC

## 2015-08-13 NOTE — Telephone Encounter (Signed)
Patient called and stated that she is still having leg pain now more of a burning pain patient like a nerve has one day of prednisone left and would like to know if it should be extended. Patient stated she bent down to pick up her grandchild and felt a sharpe burning pain in that leg from hip top knee on left side.

## 2015-08-13 NOTE — Telephone Encounter (Signed)
We can repeat the taper and add gabapentin for nerve pain.  She needs to stop lifting her grandchild bc it sounds like she now has a different problem, more like sciatica. If no better in two weeks will need MRI lumbar spine

## 2015-08-14 NOTE — Telephone Encounter (Signed)
Patient notified and voiced understanding.

## 2015-08-17 ENCOUNTER — Ambulatory Visit: Payer: No Typology Code available for payment source | Admitting: Internal Medicine

## 2015-09-04 ENCOUNTER — Encounter: Payer: Self-pay | Admitting: Internal Medicine

## 2015-09-04 ENCOUNTER — Ambulatory Visit (INDEPENDENT_AMBULATORY_CARE_PROVIDER_SITE_OTHER): Payer: BLUE CROSS/BLUE SHIELD | Admitting: Internal Medicine

## 2015-09-04 VITALS — BP 136/84 | HR 82 | Temp 98.1°F | Resp 12 | Ht 69.0 in | Wt 288.4 lb

## 2015-09-04 DIAGNOSIS — M545 Low back pain, unspecified: Secondary | ICD-10-CM

## 2015-09-04 DIAGNOSIS — M7072 Other bursitis of hip, left hip: Secondary | ICD-10-CM

## 2015-09-04 DIAGNOSIS — K591 Functional diarrhea: Secondary | ICD-10-CM

## 2015-09-04 DIAGNOSIS — Z23 Encounter for immunization: Secondary | ICD-10-CM | POA: Diagnosis not present

## 2015-09-04 DIAGNOSIS — R040 Epistaxis: Secondary | ICD-10-CM

## 2015-09-04 DIAGNOSIS — M7521 Bicipital tendinitis, right shoulder: Secondary | ICD-10-CM

## 2015-09-04 MED ORDER — TRAMADOL HCL 50 MG PO TABS: 50.0000 mg | ORAL_TABLET | Freq: Three times a day (TID) | ORAL | Status: DC | PRN

## 2015-09-04 MED ORDER — MUPIROCIN CALCIUM 2 % EX CREA: 1.0000 "application " | TOPICAL_CREAM | Freq: Two times a day (BID) | CUTANEOUS | Status: DC

## 2015-09-04 MED ORDER — MELOXICAM 15 MG PO TABS: 15.0000 mg | ORAL_TABLET | Freq: Every day | ORAL | Status: DC

## 2015-09-04 NOTE — Patient Instructions (Addendum)
You can take up to 2000 mg daily of tylenol .   Meloxciam 15 mg once daily with food. (NSAID)   With the meloxicam,  you can take tylenol and tramadol but not ibuprofen or Aleve (only 1 NSAID at a time)  Adding tramadol for additional pain control  Up to 3 dAILY   Orthopedics referral if no better in 4 weeks    bactroban ointment twice daily for your nose

## 2015-09-04 NOTE — Progress Notes (Signed)
Subjective:  Patient ID: Amy Petty, female    DOB: [birth year]  Age: 59 y.o. MRN: [id number]  CC: The primary encounter diagnosis was Encounter for immunization. Diagnoses of Biceps tendonitis on right, Ischial bursitis of left side, Functional diarrhea, Frequent nosebleeds, and Bilateral low back pain without sciatica were also pertinent to this visit.  HPI LYNNIE KOEHLER presents for follow up on tendonitis and  bursitis involving the right biceps and left ischial tuberosity diagnosed at last visit one month ago. She states that her pain improved with use of the prednisone taper.   No longer having pain on tuberosity but now having low back pain in the midline and across both SI joints.  Still picking up 48 month old grandson, using tylenol > 2000 mg daily. and gabapentin until recently.   2) Nosebleeds.  She states that ever since the heat began running in her home,. She is having recurrent nasal drainage that is blood streaked mucoid,  .  The problem recurs every winter.  Last year she as treated with topical bactroban with good resutls  Plain saline mist made the problem worse.  Wants refill on bactroban.  3) She reports having multiple loose stools daily for the past 3 weeks. Without fevers or abdominal cramping,.  No other family members affected.     Outpatient Prescriptions Prior to Visit  Medication Sig Dispense Refill  . gabapentin (NEURONTIN) 100 MG capsule Take 1 capsule (100 mg total) by mouth 3 (three) times daily. 90 capsule 3  . hydrochlorothiazide (HYDRODIURIL) 25 MG tablet TAKE ONE TABLET BY MOUTH ONE TIME DAILY 90 tablet 1  . Melatonin 2.5 MG CHEW Chew 1 tablet by mouth at bedtime.    . metoprolol succinate (TOPROL-XL) 100 MG 24 hr tablet TAKE ONE TABLET BY MOUTH ONE TIME DAILY WITH FOOD OR IMMEDIATELY FOLLOWING A MEAL 90 tablet 0  . Multiple Vitamin (MULTIVITAMIN) tablet Take 1 tablet by mouth daily.      . Multiple Vitamins-Minerals (HAIR/SKIN/NAILS PO) Take by mouth daily.       Marland Kitchen omeprazole (PRILOSEC) 40 MG capsule Take 1 capsule (40 mg total) by mouth daily. 30 capsule 3  . traMADol (ULTRAM) 50 MG tablet Take 1 tablet (50 mg total) by mouth every 8 (eight) hours as needed. 30 tablet 0  . predniSONE (DELTASONE) 10 MG tablet 6 tablets on Day 1 , then reduce by 1 tablet daily until gone (Patient not taking: Reported on 09/04/2015) 21 tablet 0  . predniSONE (DELTASONE) 10 MG tablet 6 tablets on Day 1 , then reduce by 1 tablet daily until gone (Patient not taking: Reported on 09/04/2015) 21 tablet 0   No facility-administered medications prior to visit.    Review of Systems;  Patient denies headache, fevers, malaise, unintentional weight loss, skin rash, eye pain, sinus congestion and sinus pain, sore throat, dysphagia,  hemoptysis , cough, dyspnea, wheezing, chest pain, palpitations, orthopnea, edema, abdominal pain, nausea, melena, diarrhea, constipation, flank pain, dysuria, hematuria, urinary  Frequency, nocturia, numbness, tingling, seizures,  Focal weakness, Loss of consciousness,  Tremor, insomnia, depression, anxiety, and suicidal ideation.      Objective:  BP 136/84 mmHg  Pulse 82  Temp(Src) 98.1 F (36.7 C) (Oral)  Resp 12  Ht 5\' 9"  (1.753 m)  Wt 288 lb 6 oz (130.806 kg)  BMI 42.57 kg/m2  SpO2 95%  BP Readings from Last 3 Encounters:  09/04/15 136/84  08/06/15 128/88  08/03/15 112/76    Wt Readings from Last  3 Encounters:  09/04/15 288 lb 6 oz (130.806 kg)  08/06/15 285 lb 2 oz (129.332 kg)  08/03/15 280 lb (127.007 kg)    General appearance: alert, cooperative and appears stated age Ears: normal TM's and external ear canals both ears Throat: lips, mucosa, and tongue normal; teeth and gums normal Neck: no adenopathy, no carotid bruit, supple, symmetrical, trachea midline and thyroid not enlarged, symmetric, no tenderness/mass/nodules Back: symmetric, no curvature. ROM normal. No CVA tenderness. Lungs: clear to auscultation  bilaterally Heart: regular rate and rhythm, S1, S2 normal, no murmur, click, rub or gallop Abdomen: soft, non-tender; bowel sounds normal; no masses,  no organomegaly Pulses: 2+ and symmetric Skin: Skin color, texture, turgor normal. No rashes or lesions Lymph nodes: Cervical, supraclavicular, and axillary nodes normal.  Lab Results  Component Value Date   HGBA1C 5.6 08/15/2013    Lab Results  Component Value Date   CREATININE 0.88 08/03/2015   CREATININE 0.7 09/26/2014   CREATININE 0.6 08/15/2013    Lab Results  Component Value Date   WBC 12.2* 08/03/2015   HGB 14.6 08/03/2015   HCT 44.6 08/03/2015   PLT 332 08/03/2015   GLUCOSE 109* 08/03/2015   CHOL 215* 09/26/2014   TRIG 75.0 09/26/2014   HDL 59.30 09/26/2014   LDLCALC 141* 09/26/2014   ALT 20 08/03/2015   AST 29 08/03/2015   NA 140 08/03/2015   K 3.7 08/03/2015   CL 100* 08/03/2015   CREATININE 0.88 08/03/2015   BUN 21* 08/03/2015   CO2 27 08/03/2015   TSH 2.83 09/26/2014   HGBA1C 5.6 08/15/2013    No results found.  Assessment & Plan:   Problem List Items Addressed This Visit    Biceps tendonitis on right    Improved but not resolved due to continued strain.  Behavioral changes advised.  Starting NSAID       Ischial bursitis of left side    Improved.  Trial of daily meloxicam for low back pain.       Diarrhea    Up ro 6% of patients report diarrhea with use of gabapentin.  Advised to stop medication . Workup to follow if continuous      Frequent nosebleeds    Secondary to dry air,  Complicated by probable MRSA .  bactroban bid.       Low back pain    Without sciatica.  Tramadol and meloxicam trial.       Relevant Medications   meloxicam (MOBIC) 15 MG tablet   traMADol (ULTRAM) 50 MG tablet    Other Visit Diagnoses    Encounter for immunization    -  Primary     A total of 40 minutes was spent with patient more than half of which was spent in counseling patient on the above mentioned  issues , reviewing and explaining recent labs and imaging studies done, and coordination of care.  I have discontinued Ms. Rachels predniSONE and predniSONE. I am also having her start on mupirocin cream and meloxicam. Additionally, I am having her maintain her multivitamin, Multiple Vitamins-Minerals (HAIR/SKIN/NAILS PO), hydrochlorothiazide, metoprolol succinate, Melatonin, omeprazole, gabapentin, and traMADol.  Meds ordered this encounter  Medications  . mupirocin cream (BACTROBAN) 2 %    Sig: Apply 1 application topically 2 (two) times daily.    Dispense:  15 g    Refill:  1  . meloxicam (MOBIC) 15 MG tablet    Sig: Take 1 tablet (15 mg total) by mouth daily.    Dispense:  30 tablet    Refill:  2  . traMADol (ULTRAM) 50 MG tablet    Sig: Take 1 tablet (50 mg total) by mouth every 8 (eight) hours as needed.    Dispense:  90 tablet    Refill:  3    Medications Discontinued During This Encounter  Medication Reason  . predniSONE (DELTASONE) 10 MG tablet   . traMADol (ULTRAM) 50 MG tablet Reorder  . predniSONE (DELTASONE) 10 MG tablet   . predniSONE (DELTASONE) 10 MG tablet     Follow-up: No Follow-up on file.   Crecencio Mc, MD

## 2015-09-06 DIAGNOSIS — R197 Diarrhea, unspecified: Secondary | ICD-10-CM | POA: Insufficient documentation

## 2015-09-06 DIAGNOSIS — M545 Low back pain, unspecified: Secondary | ICD-10-CM | POA: Insufficient documentation

## 2015-09-06 DIAGNOSIS — R040 Epistaxis: Secondary | ICD-10-CM | POA: Insufficient documentation

## 2015-09-06 NOTE — Assessment & Plan Note (Signed)
Improved but not resolved due to continued strain.  Behavioral changes advised.  Starting NSAID

## 2015-09-06 NOTE — Assessment & Plan Note (Signed)
Improved.  Trial of daily meloxicam for low back pain.

## 2015-09-06 NOTE — Assessment & Plan Note (Signed)
Up ro 6% of patients report diarrhea with use of gabapentin.  Advised to stop medication . Workup to follow if continuous

## 2015-09-06 NOTE — Assessment & Plan Note (Signed)
Secondary to dry air,  Complicated by probable MRSA .  bactroban bid.

## 2015-09-06 NOTE — Assessment & Plan Note (Signed)
Without sciatica.  Tramadol and meloxicam trial.

## 2015-09-12 ENCOUNTER — Other Ambulatory Visit: Payer: Self-pay | Admitting: Internal Medicine

## 2015-09-18 ENCOUNTER — Other Ambulatory Visit: Payer: Self-pay

## 2015-09-18 MED ORDER — GABAPENTIN 100 MG PO CAPS: 100.0000 mg | ORAL_CAPSULE | Freq: Three times a day (TID) | ORAL | Status: DC

## 2015-10-01 ENCOUNTER — Ambulatory Visit (INDEPENDENT_AMBULATORY_CARE_PROVIDER_SITE_OTHER): Payer: BLUE CROSS/BLUE SHIELD | Admitting: Internal Medicine

## 2015-10-01 ENCOUNTER — Other Ambulatory Visit (HOSPITAL_COMMUNITY)
Admission: RE | Admit: 2015-10-01 | Discharge: 2015-10-01 | Disposition: A | Discharge status: Home / Self Care | Payer: BLUE CROSS/BLUE SHIELD | Source: Ambulatory Visit | Attending: Internal Medicine | Admitting: Internal Medicine

## 2015-10-01 ENCOUNTER — Encounter: Payer: Self-pay | Admitting: Internal Medicine

## 2015-10-01 VITALS — BP 146/90 | HR 76 | Temp 98.7°F | Resp 12 | Ht 69.0 in | Wt 286.8 lb

## 2015-10-01 DIAGNOSIS — Z113 Encounter for screening for infections with a predominantly sexual mode of transmission: Secondary | ICD-10-CM | POA: Diagnosis not present

## 2015-10-01 DIAGNOSIS — I471 Supraventricular tachycardia: Secondary | ICD-10-CM

## 2015-10-01 DIAGNOSIS — M5442 Lumbago with sciatica, left side: Secondary | ICD-10-CM

## 2015-10-01 DIAGNOSIS — G5602 Carpal tunnel syndrome, left upper limb: Secondary | ICD-10-CM

## 2015-10-01 DIAGNOSIS — Z0000 Encounter for general adult medical examination without abnormal findings: Secondary | ICD-10-CM | POA: Diagnosis not present

## 2015-10-01 DIAGNOSIS — G8929 Other chronic pain: Secondary | ICD-10-CM

## 2015-10-01 DIAGNOSIS — E669 Obesity, unspecified: Secondary | ICD-10-CM | POA: Diagnosis not present

## 2015-10-01 DIAGNOSIS — Z01419 Encounter for gynecological examination (general) (routine) without abnormal findings: Secondary | ICD-10-CM | POA: Diagnosis present

## 2015-10-01 DIAGNOSIS — M7521 Bicipital tendinitis, right shoulder: Secondary | ICD-10-CM

## 2015-10-01 DIAGNOSIS — E559 Vitamin D deficiency, unspecified: Secondary | ICD-10-CM

## 2015-10-01 DIAGNOSIS — Z124 Encounter for screening for malignant neoplasm of cervix: Secondary | ICD-10-CM | POA: Diagnosis not present

## 2015-10-01 DIAGNOSIS — M5440 Lumbago with sciatica, unspecified side: Secondary | ICD-10-CM | POA: Diagnosis not present

## 2015-10-01 DIAGNOSIS — Z1239 Encounter for other screening for malignant neoplasm of breast: Secondary | ICD-10-CM

## 2015-10-01 DIAGNOSIS — Z1151 Encounter for screening for human papillomavirus (HPV): Secondary | ICD-10-CM | POA: Diagnosis present

## 2015-10-01 MED ORDER — HYDROCODONE-ACETAMINOPHEN 5-325 MG PO TABS: 1.0000 | ORAL_TABLET | Freq: Every evening | ORAL | Status: DC | PRN

## 2015-10-01 NOTE — Progress Notes (Signed)
Pre-visit discussion using our clinic review tool. No additional management support is needed unless otherwise documented below in the visit note.  

## 2015-10-01 NOTE — Assessment & Plan Note (Signed)

## 2015-10-01 NOTE — Assessment & Plan Note (Signed)
Referral to [facility] for futher evaluation.

## 2015-10-01 NOTE — Patient Instructions (Signed)
Use the tramadol for daytime pain, and the vicodin for nighttime pain  MRI Lumbar spine in anticipation of seeing physiatry vs neurosurgery  Referral to [facility] for right biceps tendonitis and left carpal tunnel vs ulnar entrapment   Menopause is a normal process in which your reproductive ability comes to an end. This process happens gradually over a span of months to years, usually between the ages of 88 and 33. Menopause is complete when you have missed 12 consecutive menstrual periods. It is important to talk with your health care provider about some of the most common conditions that affect postmenopausal women, such as heart disease, cancer, and bone loss (osteoporosis). Adopting a healthy lifestyle and getting preventive care can help to promote your health and wellness. Those actions can also lower your chances of developing some of these common conditions. WHAT SHOULD I KNOW ABOUT MENOPAUSE? During menopause, you may experience a number of symptoms, such as:  Moderate-to-severe hot flashes.  Night sweats.  Decrease in sex drive.  Mood swings.  Headaches.  Tiredness.  Irritability.  Memory problems.  Insomnia. Choosing to treat or not to treat menopausal changes is an individual decision that you make with your health care provider. WHAT SHOULD I KNOW ABOUT HORMONE REPLACEMENT THERAPY AND SUPPLEMENTS? Hormone therapy products are effective for treating symptoms that are associated with menopause, such as hot flashes and night sweats. Hormone replacement carries certain risks, especially as you become older. If you are thinking about using estrogen or estrogen with progestin treatments, discuss the benefits and risks with your health care provider. WHAT SHOULD I KNOW ABOUT HEART DISEASE AND STROKE? Heart disease, heart attack, and stroke become more likely as you age. This may be due, in part, to the hormonal changes that your body experiences during menopause. These  can affect how your body processes dietary fats, triglycerides, and cholesterol. Heart attack and stroke are both medical emergencies. There are many things that you can do to help prevent heart disease and stroke:  Have your blood pressure checked at least every 1-2 years. High blood pressure causes heart disease and increases the risk of stroke.  If you are 50-67 years old, ask your health care provider if you should take aspirin to prevent a heart attack or a stroke.  Do not use any tobacco products, including cigarettes, chewing tobacco, or electronic cigarettes. If you need help quitting, ask your health care provider.  It is important to eat a healthy diet and maintain a healthy weight.  Be sure to include plenty of vegetables, fruits, low-fat dairy products, and lean protein.  Avoid eating foods that are high in solid fats, added sugars, or salt (sodium).  Get regular exercise. This is one of the most important things that you can do for your health.  Try to exercise for at least 150 minutes each week. The type of exercise that you do should increase your heart rate and make you sweat. This is known as moderate-intensity exercise.  Try to do strengthening exercises at least twice each week. Do these in addition to the moderate-intensity exercise.  Know your numbers.Ask your health care provider to check your cholesterol and your blood glucose. Continue to have your blood tested as directed by your health care provider. WHAT SHOULD I KNOW ABOUT CANCER SCREENING? There are several types of cancer. Take the following steps to reduce your risk and to catch any cancer development as early as possible. Breast Cancer  Practice breast self-awareness.  This means  understanding how your breasts normally appear and feel.  It also means doing regular breast self-exams. Let your health care provider know about any changes, no matter how small.  If you are 47 or older, have a clinician do a  breast exam (clinical breast exam or CBE) every year. Depending on your age, family history, and medical history, it may be recommended that you also have a yearly breast X-ray (mammogram).  If you have a family history of breast cancer, talk with your health care provider about genetic screening.  If you are at high risk for breast cancer, talk with your health care provider about having an MRI and a mammogram every year.  Breast cancer (BRCA) gene test is recommended for women who have family members with BRCA-related cancers. Results of the assessment will determine the need for genetic counseling and BRCA1 and for BRCA2 testing. BRCA-related cancers include these types:  Breast. This occurs in males or females.  Ovarian.  Tubal. This may also be called fallopian tube cancer.  Cancer of the abdominal or pelvic lining (peritoneal cancer).  Prostate.  Pancreatic. Cervical, Uterine, and Ovarian Cancer Your health care provider may recommend that you be screened regularly for cancer of the pelvic organs. These include your ovaries, uterus, and vagina. This screening involves a pelvic exam, which includes checking for microscopic changes to the surface of your cervix (Pap test).  For women ages 21-65, health care providers may recommend a pelvic exam and a Pap test every three years. For women ages 26-65, they may recommend the Pap test and pelvic exam, combined with testing for human papilloma virus (HPV), every five years. Some types of HPV increase your risk of cervical cancer. Testing for HPV may also be done on women of any age who have unclear Pap test results.  Other health care providers may not recommend any screening for nonpregnant women who are considered low risk for pelvic cancer and have no symptoms. Ask your health care provider if a screening pelvic exam is right for you.  If you have had past treatment for cervical cancer or a condition that could lead to cancer, you need  Pap tests and screening for cancer for at least 20 years after your treatment. If Pap tests have been discontinued for you, your risk factors (such as having a new sexual partner) need to be reassessed to determine if you should start having screenings again. Some women have medical problems that increase the chance of getting cervical cancer. In these cases, your health care provider may recommend that you have screening and Pap tests more often.  If you have a family history of uterine cancer or ovarian cancer, talk with your health care provider about genetic screening.  If you have vaginal bleeding after reaching menopause, tell your health care provider.  There are currently no reliable tests available to screen for ovarian cancer. Lung Cancer Lung cancer screening is recommended for adults 63-54 years old who are at high risk for lung cancer because of a history of smoking. A yearly low-dose CT scan of the lungs is recommended if you:  Currently smoke.  Have a history of at least 30 pack-years of smoking and you currently smoke or have quit within the past 15 years. A pack-year is smoking an average of one pack of cigarettes per day for one year. Yearly screening should:  Continue until it has been 15 years since you quit.  Stop if you develop a health problem that would  prevent you from having lung cancer treatment. Colorectal Cancer  This type of cancer can be detected and can often be prevented.  Routine colorectal cancer screening usually begins at age 92 and continues through age 6.  If you have risk factors for colon cancer, your health care provider may recommend that you be screened at an earlier age.  If you have a family history of colorectal cancer, talk with your health care provider about genetic screening.  Your health care provider may also recommend using home test kits to check for hidden blood in your stool.  A small camera at the end of a tube can be used to  examine your colon directly (sigmoidoscopy or colonoscopy). This is done to check for the earliest forms of colorectal cancer.  Direct examination of the colon should be repeated every 5-10 years until age 36. However, if early forms of precancerous polyps or small growths are found or if you have a family history or genetic risk for colorectal cancer, you may need to be screened more often. Skin Cancer  Check your skin from head to toe regularly.  Monitor any moles. Be sure to tell your health care provider:  About any new moles or changes in moles, especially if there is a change in a mole's shape or color.  If you have a mole that is larger than the size of a pencil eraser.  If any of your family members has a history of skin cancer, especially at a young age, talk with your health care provider about genetic screening.  Always use sunscreen. Apply sunscreen liberally and repeatedly throughout the day.  Whenever you are outside, protect yourself by wearing long sleeves, pants, a wide-brimmed hat, and sunglasses. WHAT SHOULD I KNOW ABOUT OSTEOPOROSIS? Osteoporosis is a condition in which bone destruction happens more quickly than new bone creation. After menopause, you may be at an increased risk for osteoporosis. To help prevent osteoporosis or the bone fractures that can happen because of osteoporosis, the following is recommended:  If you are 59-83 years old, get at least 1,000 mg of calcium and at least 600 mg of vitamin D per day.  If you are older than age 54 but younger than age 23, get at least 1,200 mg of calcium and at least 600 mg of vitamin D per day.  If you are older than age 41, get at least 1,200 mg of calcium and at least 800 mg of vitamin D per day. Smoking and excessive alcohol intake increase the risk of osteoporosis. Eat foods that are rich in calcium and vitamin D, and do weight-bearing exercises several times each week as directed by your health care provider. WHAT  SHOULD I KNOW ABOUT HOW MENOPAUSE AFFECTS [place]? Depression may occur at any age, but it is more common as you become older. Common symptoms of depression include:  Low or sad mood.  Changes in sleep patterns.  Changes in appetite or eating patterns.  Feeling an overall lack of motivation or enjoyment of activities that you previously enjoyed.  Frequent crying spells. Talk with your health care provider if you think that you are experiencing depression. WHAT SHOULD I KNOW ABOUT IMMUNIZATIONS? It is important that you get and maintain your immunizations. These include:  Tetanus, diphtheria, and pertussis (Tdap) booster vaccine.  Influenza every year before the flu season begins.  Pneumonia vaccine.  Shingles vaccine. Your health care provider may also recommend other immunizations.   This information is not intended  to replace advice given to you by your health care provider. Make sure you discuss any questions you have with your health care provider.   Document Released: 12/19/2005 Document Revised: 11/17/2014 Document Reviewed: 06/29/2014 Elsevier Interactive Patient Education [employer].

## 2015-10-01 NOTE — Progress Notes (Signed)
Patient ID: Amy Petty, female    DOB: September 06, 1956  Age: 59 y.o. MRN: [id number]  The patient is here for annual  examination and management of other chronic and acute problems.   The risk factors are reflected in the social history.  The roster of all physicians providing medical care to patient - is listed in the Snapshot section of the chart.  Activities of daily living:  The patient is 100% independent in all ADLs: dressing, toileting, feeding as well as independent mobility  Home safety : The patient has smoke detectors in the home. They wear seatbelts.  There are no firearms at home. There is no violence in the home.   There is no risks for hepatitis, STDs or HIV. There is no   history of blood transfusion. They have no travel history to infectious disease endemic areas of the world.  The patient has seen their dentist in the last six month. They have seen their eye doctor in the last year. They admit to slight hearing difficulty with regard to whispered voices and some television programs.  They have deferred audiologic testing in the last year.  They do not  have excessive sun exposure. Discussed the need for sun protection: hats, long sleeves and use of sunscreen if there is significant sun exposure.   Diet: the importance of a healthy diet is discussed. They do have a healthy diet.  The benefits of regular aerobic exercise were discussed. She walks 4 times per week ,  20 minutes.   Depression screen: there are no signs or vegative symptoms of depression- irritability, change in appetite, anhedonia, sadness/tearfullness.  Cognitive assessment: the patient manages all their financial and personal affairs and is actively engaged. They could relate day,date,year and events; recalled 2/3 objects at 3 minutes; performed clock-face test normally.  The following portions of the patient's history were reviewed and updated as appropriate: allergies, current medications, past family history,  past medical history,  past surgical history, past social history  and problem list.  Visual acuity was not assessed per patient preference since she has regular follow up with her ophthalmologist. Hearing and body mass index were assessed and reviewed.   During the course of the visit the patient was educated and counseled about appropriate screening and preventive services including : fall prevention , diabetes screening, nutrition counseling, colorectal cancer screening, and recommended immunizations.    CC: The primary encounter diagnosis was Screening for cervical cancer. Diagnoses of Obesity, Vitamin D deficiency, Screen for STD (sexually transmitted disease), Chronic midline low back pain with sciatica, sciatica laterality unspecified, Biceps tendonitis on right, Carpal tunnel syndrome of left wrist, Screening for breast cancer, SVT (supraventricular tachycardia) ([place]), Left-sided low back pain with left-sided sciatica, and Routine general medical examination at a health care facility were also pertinent to this visit.  Back pain;  She did not  TOLERATE gabapentin due to diarrhea.  Still having back pain despite use of meloxicam and tramadol.  The pain is aggravate by sitting too long or by standing too long.  She has intermittent numbness that occurs in both legs, but not simultaneously.  Her left biceps continues to hurt constantly despite use of meloxicam, due to continued use of arm in picking up her grandaughter  She has a history of CTS with surgical repair involving the right wrist, and now feels that the left wrist has been having the same issues,  But she also has elbow pain on the left side as well.  Marland Kitchen  History [name] has a past medical history of Hypertension; PVC's (premature ventricular contractions); SVT (supraventricular tachycardia) ([place]); Obesity; Hypothyroidism; Diastolic dysfunction; Fibroid, uterus; and Anemia.   She has past surgical history that includes Tubal ligation;  Carpal tunnel release; Tonsillectomy and adenoidectomy; and Carpal tunnel release (2008).   Her family history includes Cancer in her father and mother; Diabetes in her sister; Liver cancer in her father; Lung cancer in her mother.She reports that she has never smoked. She has never used smokeless tobacco. She reports that she does not drink alcohol or use illicit drugs.  Outpatient Prescriptions Prior to Visit  Medication Sig Dispense Refill  . hydrochlorothiazide (HYDRODIURIL) 25 MG tablet TAKE ONE TABLET BY MOUTH ONE TIME DAILY 90 tablet 1  . Melatonin 2.5 MG CHEW Chew 1 tablet by mouth at bedtime.    . meloxicam (MOBIC) 15 MG tablet Take 1 tablet (15 mg total) by mouth daily. 30 tablet 2  . metoprolol succinate (TOPROL-XL) 100 MG 24 hr tablet TAKE ONE TABLET BY MOUTH ONE TIME DAILY WITH FOOD OR IMMEDIATELY FOLLOWING A MEAL 90 tablet 0  . Multiple Vitamin (MULTIVITAMIN) tablet Take 1 tablet by mouth daily.      . Multiple Vitamins-Minerals (HAIR/SKIN/NAILS PO) Take by mouth daily.      . mupirocin cream (BACTROBAN) 2 % Apply 1 application topically 2 (two) times daily. 15 g 1  . omeprazole (PRILOSEC) 40 MG capsule Take 1 capsule (40 mg total) by mouth daily. 30 capsule 3  . traMADol (ULTRAM) 50 MG tablet Take 1 tablet (50 mg total) by mouth every 8 (eight) hours as needed. 90 tablet 3  . gabapentin (NEURONTIN) 100 MG capsule Take 1 capsule (100 mg total) by mouth 3 (three) times daily. 90 capsule 3   No facility-administered medications prior to visit.    Review of Systems   Patient denies headache, fevers, malaise, unintentional weight loss, skin rash, eye pain, sinus congestion and sinus pain, sore throat, dysphagia,  hemoptysis , cough, dyspnea, wheezing, chest pain, palpitations, orthopnea, edema, abdominal pain, nausea, melena, diarrhea, constipation, flank pain, dysuria, hematuria, urinary  Frequency, nocturia, numbness, tingling, seizures,  Focal weakness, Loss of consciousness,   Tremor, insomnia, depression, anxiety, and suicidal ideation.      Objective:  BP 146/90 mmHg  Pulse 76  Temp(Src) 98.7 F (37.1 C) (Oral)  Resp 12  Ht 5\' 9"  (1.753 m)  Wt 286 lb 12 oz (130.069 kg)  BMI 42.33 kg/m2  SpO2 94%  LMP 11/23/2013  Physical Exam   General Appearance:    Alert, cooperative, no distress, appears stated age  Head:    Normocephalic, without obvious abnormality, atraumatic  Eyes:    PERRL, conjunctiva/corneas clear, EOM's intact, fundi    benign, both eyes  Ears:    Normal TM's and external ear canals, both ears  Nose:   Nares normal, septum midline, mucosa normal, no drainage    or sinus tenderness  Throat:   Lips, mucosa, and tongue normal; teeth and gums normal  Neck:   Supple, symmetrical, trachea midline, no adenopathy;    thyroid:  no enlargement/tenderness/nodules; no carotid   bruit or JVD  Back:     Symmetric, no curvature, ROM normal, no CVA tenderness  Lungs:     Clear to auscultation bilaterally, respirations unlabored  Chest Wall:    No tenderness or deformity   Heart:    Regular rate and rhythm, S1 and S2 normal, no murmur, rub   or gallop  Breast Exam:  Pendulous breasts, No tenderness, masses, or nipple abnormality  Abdomen:     Soft, non-tender, bowel sounds active all four quadrants,    no masses, no organomegaly  Genitalia:    Pelvic: cervix normal in appearance, external genitalia normal, no adnexal masses or tenderness, no cervical motion tenderness, rectovaginal septum normal, uterus normal size, shape, and consistency and vagina normal without discharge  Extremities:   Extremities normal, atraumatic, no cyanosis or edema  Pulses:   2+ and symmetric all extremities  Skin:   Skin color, texture, turgor normal, no rashes or lesions  Lymph nodes:   Cervical, supraclavicular, and axillary nodes normal  Neurologic:   CNII-XII intact, normal strength, sensation and reflexes    throughout       Assessment & Plan:   Problem List  Items Addressed This Visit    SVT (supraventricular tachycardia) ([place])    she is scheduled for ablation Dec 22      Obesity    I have addressed  BMI and recommended a low glycemic index diet utilizing smaller more frequent meals to increase metabolism.  I have also recommended that patient start exercising with a goal of 30 minutes of aerobic exercise a minimum of 5 days per week, but this has been hindered by her multiple orthopedic complaints. . Screening for lipid disorders, thyroid and diabetes to be done today.        Relevant Orders   Comprehensive metabolic panel   Lipid panel   TSH   Routine general medical examination at a health care facility    Annual comprehensive preventive exam was done as well as an evaluation and management of chronic conditions .  During the course of the visit the patient was educated and counseled about appropriate screening and preventive services including :  diabetes screening, lipid analysis with projected  10 year  risk for CAD   using the Framingham risk calculator for women, , nutrition counseling, colorectal cancer screening, and recommended immunizations.  Printed recommendations for health maintenance screenings was given.       Biceps tendonitis on right    Referral to [facility] for futher evaluation.       Relevant Orders   Ambulatory referral to Orthopedic Surgery   Low back pain    MRI lumbar spine ordered to rule out spinal stenosis       Relevant Medications   acetaminophen (TYLENOL) 650 MG CR tablet   HYDROcodone-acetaminophen (NORCO/VICODIN) 5-325 MG tablet   Screening for breast cancer   Relevant Orders   MM DIGITAL SCREENING BILATERAL   Screening for cervical cancer - Primary   Relevant Orders   Cytology - PAP    Other Visit Diagnoses    Vitamin D deficiency        Relevant Orders    VITAMIN D 25 Hydroxy (Vit-D Deficiency, Fractures)    Screen for STD (sexually transmitted disease)        Relevant Orders     Hepatitis C antibody    HIV antibody    Chronic midline low back pain with sciatica, sciatica laterality unspecified        Relevant Orders    MR Lumbar Spine Wo Contrast    Carpal tunnel syndrome of left wrist        Relevant Orders    Ambulatory referral to Orthopedic Surgery       I have discontinued Ms. Minnig gabapentin. I am also having her start on HYDROcodone-acetaminophen. Additionally, I am having her maintain  her multivitamin, Multiple Vitamins-Minerals (HAIR/SKIN/NAILS PO), metoprolol succinate, Melatonin, omeprazole, mupirocin cream, meloxicam, traMADol, hydrochlorothiazide, and acetaminophen.  Meds ordered this encounter  Medications  . acetaminophen (TYLENOL) 650 MG CR tablet    Sig: Take 650 mg by mouth every 8 (eight) hours.  Marland Kitchen HYDROcodone-acetaminophen (NORCO/VICODIN) 5-325 MG tablet    Sig: Take 1-2 tablets by mouth at bedtime as needed for moderate pain.    Dispense:  60 tablet    Refill:  0    Medications Discontinued During This Encounter  Medication Reason  . gabapentin (NEURONTIN) 100 MG capsule Allergic reaction  . gabapentin (NEURONTIN) 100 MG capsule Allergic reaction    Follow-up: No Follow-up on file.   Crecencio Mc, MD

## 2015-10-01 NOTE — Assessment & Plan Note (Signed)
I have addressed  BMI and recommended a low glycemic index diet utilizing smaller more frequent meals to increase metabolism.  I have also recommended that patient start exercising with a goal of 30 minutes of aerobic exercise a minimum of 5 days per week, but this has been hindered by her multiple orthopedic complaints. . Screening for lipid disorders, thyroid and diabetes to be done today.

## 2015-10-01 NOTE — Assessment & Plan Note (Signed)
MRI lumbar spine ordered to rule out spinal stenosis

## 2015-10-01 NOTE — Assessment & Plan Note (Signed)
she is scheduled for ablation Dec 22

## 2015-10-05 LAB — CYTOLOGY - PAP

## 2015-10-08 ENCOUNTER — Encounter: Payer: Self-pay | Admitting: *Deleted

## 2015-10-09 ENCOUNTER — Telehealth: Payer: Self-pay | Admitting: Internal Medicine

## 2015-10-09 DIAGNOSIS — M5440 Lumbago with sciatica, unspecified side: Secondary | ICD-10-CM

## 2015-10-09 NOTE — Telephone Encounter (Signed)
Patient notified and voiced understanding and stated she will go ASAP.

## 2015-10-09 NOTE — Assessment & Plan Note (Signed)
Plain films of lumbar spine ordered to evaluate alignment and presence of any spurring that might suggest spinal stenosis

## 2015-10-09 NOTE — Telephone Encounter (Signed)
Her insurance would not cover an MRI of the lumbar spine without prior imaging using plain films.. I have ordered the plain x rays,  Shedoes not need an appointment but  can go to [place] or [facility] any time

## 2015-10-11 ENCOUNTER — Other Ambulatory Visit: Payer: Self-pay | Admitting: Internal Medicine

## 2015-10-17 ENCOUNTER — Telehealth: Payer: Self-pay | Admitting: Internal Medicine

## 2015-10-29 ENCOUNTER — Other Ambulatory Visit: Payer: Self-pay | Admitting: Internal Medicine

## 2015-10-29 ENCOUNTER — Ambulatory Visit (INDEPENDENT_AMBULATORY_CARE_PROVIDER_SITE_OTHER)
Admission: RE | Admit: 2015-10-29 | Discharge: 2015-10-29 | Disposition: A | Discharge status: Home / Self Care | Payer: BLUE CROSS/BLUE SHIELD | Source: Ambulatory Visit | Attending: Internal Medicine | Admitting: Internal Medicine

## 2015-10-29 ENCOUNTER — Other Ambulatory Visit (INDEPENDENT_AMBULATORY_CARE_PROVIDER_SITE_OTHER): Payer: BLUE CROSS/BLUE SHIELD

## 2015-10-29 DIAGNOSIS — E669 Obesity, unspecified: Secondary | ICD-10-CM

## 2015-10-29 DIAGNOSIS — E559 Vitamin D deficiency, unspecified: Secondary | ICD-10-CM | POA: Diagnosis not present

## 2015-10-29 DIAGNOSIS — Z113 Encounter for screening for infections with a predominantly sexual mode of transmission: Secondary | ICD-10-CM

## 2015-10-29 DIAGNOSIS — M5440 Lumbago with sciatica, unspecified side: Secondary | ICD-10-CM

## 2015-10-29 LAB — LIPID PANEL
CHOLESTEROL: 221 mg/dL — AB (ref 0–200)
HDL: 51.2 mg/dL (ref >39.00)
LDL CALC: 145 mg/dL — AB (ref 0–99)
NonHDL: 170
Total CHOL/HDL Ratio: 4
Triglycerides: 126 mg/dL (ref 0.0–149.0)
VLDL: 25.2 mg/dL (ref 0.0–40.0)

## 2015-10-29 LAB — TSH: TSH: 1.83 u[IU]/mL (ref 0.35–4.50)

## 2015-10-29 LAB — VITAMIN D 25 HYDROXY (VIT D DEFICIENCY, FRACTURES): VITD: 18.7 ng/mL — AB (ref 30.00–100.00)

## 2015-10-30 ENCOUNTER — Other Ambulatory Visit: Payer: Self-pay | Admitting: Internal Medicine

## 2015-10-30 ENCOUNTER — Encounter: Payer: Self-pay | Admitting: Internal Medicine

## 2015-10-30 DIAGNOSIS — M5440 Lumbago with sciatica, unspecified side: Secondary | ICD-10-CM

## 2015-10-30 LAB — HEPATITIS C ANTIBODY: HCV AB: NEGATIVE

## 2015-10-30 LAB — HIV ANTIBODY (ROUTINE TESTING W REFLEX): HIV 1&2 Ab, 4th Generation: NONREACTIVE

## 2015-11-05 ENCOUNTER — Encounter: Payer: Self-pay | Admitting: Internal Medicine

## 2015-11-06 LAB — COMPREHENSIVE METABOLIC PANEL
ALBUMIN: 4.3 g/dL (ref 3.5–5.2)
ALK PHOS: 89 U/L (ref 39–117)
ALT: 15 U/L (ref 0–35)
AST: 14 U/L (ref 0–37)
BILIRUBIN TOTAL: 0.3 mg/dL (ref 0.2–1.2)
BUN: 24 mg/dL — AB (ref 6–23)
CO2: 31 mEq/L (ref 19–32)
Calcium: 10 mg/dL (ref 8.4–10.5)
Chloride: 102 mEq/L (ref 96–112)
Creatinine, Ser: 0.64 mg/dL (ref 0.40–1.20)
GFR: 101.07 mL/min (ref >60.00)
GLUCOSE: 103 mg/dL — AB (ref 70–99)
Potassium: 4.2 mEq/L (ref 3.5–5.1)
Sodium: 145 mEq/L (ref 135–145)
TOTAL PROTEIN: 7.2 g/dL (ref 6.0–8.3)

## 2015-11-06 MED ORDER — ERGOCALCIFEROL 1.25 MG (50000 UT) PO CAPS: 50000.0000 [IU] | ORAL_CAPSULE | ORAL | Status: DC

## 2015-11-06 NOTE — Telephone Encounter (Signed)
Please advise 

## 2015-11-14 ENCOUNTER — Telehealth: Payer: Self-pay | Admitting: *Deleted

## 2015-11-14 ENCOUNTER — Telehealth: Payer: Self-pay | Admitting: Internal Medicine

## 2015-11-14 DIAGNOSIS — M79609 Pain in unspecified limb: Secondary | ICD-10-CM

## 2015-11-14 DIAGNOSIS — M79621 Pain in right upper arm: Secondary | ICD-10-CM

## 2015-11-14 NOTE — Telephone Encounter (Signed)
Insurance will not pay for two MRIs in close proximity. But plain films of the cervical spine and shoulder would have to be ordered first, and since she is now seeing Orthopedics, iI need to see their notes. . I have not received the MRI of lumbar spine report yet.  Where was it done? Can it be requested?

## 2015-11-14 NOTE — Telephone Encounter (Signed)
Since she is seeing [facility] , the most logical thing is for them to address her shoulder as well.  I can do a referral for the arm a nd they can evaluate and treat

## 2015-11-14 NOTE — Telephone Encounter (Signed)
Spoke with the patient and she verbalized understanding. Please do referral thanks

## 2015-11-14 NOTE — Telephone Encounter (Signed)
Spoke with the patient, she saw [facility] last month and they did plain films of her back, and put her on low dose prednisone for two weeks (2 pills a day) and it is not touching the pain.  They stated the next steps are to do a MRI which she is having tomorrow morning.   She verbalized understanding that two MRI's can't be done at a close proximity, she just wants to see if she can get something done with her arm too?  I hope that clarifies.  Thanks

## 2015-11-14 NOTE — Telephone Encounter (Signed)
Pt left a vm regarding an MRI that was done on Thursday 11/08/2015 with back disc issues. Pt states the past few days the middle toe has been numb and the side of her foot and now her arm got worse and she went to the orthopedic it's not getting better. Pt wants to know if Dr Derrel Nip can recommend if she can get a MRI done on her arm as well? Thank You!

## 2015-11-14 NOTE — Telephone Encounter (Signed)
Please advise 

## 2015-11-15 ENCOUNTER — Ambulatory Visit
Admission: RE | Admit: 2015-11-15 | Discharge: 2015-11-15 | Disposition: A | Discharge status: Home / Self Care | Payer: BLUE CROSS/BLUE SHIELD | Source: Ambulatory Visit | Attending: Internal Medicine | Admitting: Internal Medicine

## 2015-11-15 DIAGNOSIS — M4806 Spinal stenosis, lumbar region: Secondary | ICD-10-CM | POA: Insufficient documentation

## 2015-11-15 DIAGNOSIS — G8929 Other chronic pain: Secondary | ICD-10-CM | POA: Diagnosis present

## 2015-11-15 DIAGNOSIS — M5440 Lumbago with sciatica, unspecified side: Secondary | ICD-10-CM | POA: Insufficient documentation

## 2015-11-17 ENCOUNTER — Encounter: Payer: Self-pay | Admitting: Internal Medicine

## 2015-12-10 ENCOUNTER — Other Ambulatory Visit: Payer: Self-pay | Admitting: Internal Medicine

## 2016-01-19 ENCOUNTER — Other Ambulatory Visit: Payer: Self-pay | Admitting: Internal Medicine

## 2016-03-30 ENCOUNTER — Other Ambulatory Visit: Payer: Self-pay | Admitting: Internal Medicine

## 2016-04-05 ENCOUNTER — Other Ambulatory Visit: Payer: Self-pay | Admitting: Internal Medicine

## 2016-07-01 ENCOUNTER — Other Ambulatory Visit: Payer: Self-pay | Admitting: Internal Medicine

## 2016-07-04 ENCOUNTER — Other Ambulatory Visit: Payer: Self-pay | Admitting: Internal Medicine

## 2016-07-31 ENCOUNTER — Other Ambulatory Visit: Payer: Self-pay | Admitting: Internal Medicine

## 2016-08-05 ENCOUNTER — Ambulatory Visit (INDEPENDENT_AMBULATORY_CARE_PROVIDER_SITE_OTHER): Payer: BLUE CROSS/BLUE SHIELD | Admitting: *Deleted

## 2016-08-05 DIAGNOSIS — Z23 Encounter for immunization: Secondary | ICD-10-CM | POA: Diagnosis not present

## 2016-08-07 ENCOUNTER — Other Ambulatory Visit: Payer: Self-pay | Admitting: Internal Medicine

## 2016-08-07 ENCOUNTER — Ambulatory Visit
Admission: RE | Admit: 2016-08-07 | Discharge: 2016-08-07 | Disposition: A | Discharge status: Home / Self Care | Payer: BLUE CROSS/BLUE SHIELD | Source: Ambulatory Visit | Attending: Internal Medicine | Admitting: Internal Medicine

## 2016-08-07 DIAGNOSIS — Z1239 Encounter for other screening for malignant neoplasm of breast: Secondary | ICD-10-CM

## 2016-08-07 DIAGNOSIS — Z1231 Encounter for screening mammogram for malignant neoplasm of breast: Secondary | ICD-10-CM | POA: Insufficient documentation

## 2016-09-08 ENCOUNTER — Other Ambulatory Visit: Payer: Self-pay

## 2016-09-08 DIAGNOSIS — E559 Vitamin D deficiency, unspecified: Secondary | ICD-10-CM

## 2016-09-08 DIAGNOSIS — E6609 Other obesity due to excess calories: Secondary | ICD-10-CM

## 2016-09-08 DIAGNOSIS — Z6835 Body mass index (BMI) 35.0-35.9, adult: Secondary | ICD-10-CM

## 2016-09-08 DIAGNOSIS — I10 Essential (primary) hypertension: Secondary | ICD-10-CM

## 2016-09-08 DIAGNOSIS — D638 Anemia in other chronic diseases classified elsewhere: Secondary | ICD-10-CM

## 2016-09-08 NOTE — Telephone Encounter (Signed)
Patient last seen last seen on 09/30/2015 , next office visit 10/01/2016.

## 2016-09-09 MED ORDER — MELOXICAM 15 MG PO TABS: 15.0000 mg | ORAL_TABLET | Freq: Every day | ORAL | 0 refills | Status: DC

## 2016-09-09 MED ORDER — MELOXICAM 15 MG PO TABS: 15.0000 mg | ORAL_TABLET | Freq: Every day | ORAL | 2 refills | Status: DC

## 2016-09-09 NOTE — Telephone Encounter (Signed)
Please Refill for 30 days only.  OFFICE VISIT NEEDED and fasting labs needed  prior to any more refills

## 2016-09-30 ENCOUNTER — Other Ambulatory Visit: Payer: Self-pay | Admitting: Internal Medicine

## 2016-09-30 NOTE — Telephone Encounter (Signed)
Pt coming in to see Dr. Derrel Nip tomorrow for CPE. Please refill med @ visit. Last seen on 10/01/15.

## 2016-10-01 ENCOUNTER — Encounter: Payer: Self-pay | Admitting: Internal Medicine

## 2016-10-01 ENCOUNTER — Ambulatory Visit (INDEPENDENT_AMBULATORY_CARE_PROVIDER_SITE_OTHER): Payer: BLUE CROSS/BLUE SHIELD | Admitting: Internal Medicine

## 2016-10-01 VITALS — BP 130/98 | HR 96 | Temp 97.4°F | Resp 12 | Ht 67.5 in | Wt 289.2 lb

## 2016-10-01 DIAGNOSIS — E559 Vitamin D deficiency, unspecified: Secondary | ICD-10-CM | POA: Diagnosis not present

## 2016-10-01 DIAGNOSIS — M545 Low back pain, unspecified: Secondary | ICD-10-CM

## 2016-10-01 DIAGNOSIS — E6609 Other obesity due to excess calories: Secondary | ICD-10-CM

## 2016-10-01 DIAGNOSIS — I10 Essential (primary) hypertension: Secondary | ICD-10-CM

## 2016-10-01 DIAGNOSIS — G8929 Other chronic pain: Secondary | ICD-10-CM | POA: Diagnosis not present

## 2016-10-01 DIAGNOSIS — Z6835 Body mass index (BMI) 35.0-35.9, adult: Secondary | ICD-10-CM | POA: Diagnosis not present

## 2016-10-01 DIAGNOSIS — D638 Anemia in other chronic diseases classified elsewhere: Secondary | ICD-10-CM

## 2016-10-01 DIAGNOSIS — I471 Supraventricular tachycardia: Secondary | ICD-10-CM

## 2016-10-01 DIAGNOSIS — Z0000 Encounter for general adult medical examination without abnormal findings: Secondary | ICD-10-CM

## 2016-10-01 DIAGNOSIS — E785 Hyperlipidemia, unspecified: Secondary | ICD-10-CM

## 2016-10-01 LAB — COMPREHENSIVE METABOLIC PANEL
ALBUMIN: 4.3 g/dL (ref 3.5–5.2)
ALT: 19 U/L (ref 0–35)
AST: 18 U/L (ref 0–37)
Alkaline Phosphatase: 84 U/L (ref 39–117)
BILIRUBIN TOTAL: 0.6 mg/dL (ref 0.2–1.2)
BUN: 14 mg/dL (ref 6–23)
CALCIUM: 9.8 mg/dL (ref 8.4–10.5)
CHLORIDE: 99 meq/L (ref 96–112)
CO2: 33 mEq/L — ABNORMAL HIGH (ref 19–32)
Creatinine, Ser: 0.63 mg/dL (ref 0.40–1.20)
GFR: 102.6 mL/min (ref >60.00)
Glucose, Bld: 94 mg/dL (ref 70–99)
Potassium: 3.8 mEq/L (ref 3.5–5.1)
Sodium: 141 mEq/L (ref 135–145)
Total Protein: 7.5 g/dL (ref 6.0–8.3)

## 2016-10-01 LAB — MICROALBUMIN / CREATININE URINE RATIO
Creatinine,U: 22.1 mg/dL
Microalb Creat Ratio: 3.2 mg/g (ref 0.0–30.0)
Microalb, Ur: <0.7 mg/dL (ref 0.0–1.9)

## 2016-10-01 LAB — CBC WITH DIFFERENTIAL/PLATELET
BASOS PCT: 0.5 % (ref 0.0–3.0)
Basophils Absolute: 0 10*3/uL (ref 0.0–0.1)
Eosinophils Absolute: 0.2 10*3/uL (ref 0.0–0.7)
Eosinophils Relative: 2.1 % (ref 0.0–5.0)
HEMATOCRIT: 41.1 % (ref 36.0–46.0)
Hemoglobin: 13.5 g/dL (ref 12.0–15.0)
LYMPHS ABS: 2.9 10*3/uL (ref 0.7–4.0)
LYMPHS PCT: 32.1 % (ref 12.0–46.0)
MCHC: 32.8 g/dL (ref 30.0–36.0)
MCV: 94.4 fl (ref 78.0–100.0)
MONOS PCT: 5.5 % (ref 3.0–12.0)
Monocytes Absolute: 0.5 10*3/uL (ref 0.1–1.0)
NEUTROS ABS: 5.4 10*3/uL (ref 1.4–7.7)
Neutrophils Relative %: 59.8 % (ref 43.0–77.0)
PLATELETS: 285 10*3/uL (ref 150.0–400.0)
RBC: 4.36 Mil/uL (ref 3.87–5.11)
RDW: 15 % (ref 11.5–15.5)
WBC: 9.1 10*3/uL (ref 4.0–10.5)

## 2016-10-01 LAB — TSH: TSH: 2.05 u[IU]/mL (ref 0.35–4.50)

## 2016-10-01 LAB — HEMOGLOBIN A1C: Hgb A1c MFr Bld: 5.6 % (ref 4.6–6.5)

## 2016-10-01 LAB — LIPID PANEL
CHOLESTEROL: 227 mg/dL — AB (ref 0–200)
HDL: 52.9 mg/dL (ref >39.00)
LDL CALC: 151 mg/dL — AB (ref 0–99)
NonHDL: 173.69
TRIGLYCERIDES: 111 mg/dL (ref 0.0–149.0)
Total CHOL/HDL Ratio: 4
VLDL: 22.2 mg/dL (ref 0.0–40.0)

## 2016-10-01 LAB — VITAMIN D 25 HYDROXY (VIT D DEFICIENCY, FRACTURES): VITD: 34.81 ng/mL (ref 30.00–100.00)

## 2016-10-01 MED ORDER — TRAMADOL HCL 50 MG PO TABS: 50.0000 mg | ORAL_TABLET | Freq: Three times a day (TID) | ORAL | 5 refills | Status: DC | PRN

## 2016-10-01 MED ORDER — SPIRONOLACTONE 50 MG PO TABS: 50.0000 mg | ORAL_TABLET | Freq: Every day | ORAL | 1 refills | Status: DC

## 2016-10-01 NOTE — Progress Notes (Signed)
Pre-visit discussion using our clinic review tool. No additional management support is needed unless otherwise documented below in the visit note.  

## 2016-10-01 NOTE — Patient Instructions (Addendum)
Suspend the motrin and aleve for 48 hours and check your BP while off of NSAIDs.    If  BP normalizes (120/70) ,    No additional meds are needed and you   Can resume sparingly (once daily max)   If Bp still elevated.  E mail me .    Changing diuretic from hctz to spironolactone for the hair benefits    Use tramadol eery 6 hours,  Along with tylenol (max dose 2000 mg if using daily ,  )  Referral to [facility] for treatment  Of low back pain   Spironolactone tablets What is this medicine? SPIRONOLACTONE (speer on oh LAK tone) is a diuretic. It helps you make more urine and to lose excess water from your body. This medicine is used to treat high blood pressure, and edema or swelling from heart, kidney, or liver disease. It is also used to treat patients who make too much aldosterone or have low potassium. This medicine may be used for other purposes; ask your health care provider or pharmacist if you have questions. COMMON BRAND NAME(S): Aldactone What should I tell my health care provider before I take this medicine? They need to know if you have any of these conditions: -high blood level of potassium -kidney disease or trouble making urine -liver disease -an unusual or allergic reaction to spironolactone, other medicines, foods, dyes, or preservatives -pregnant or trying to get pregnant -breast-feeding How should I use this medicine? Take this medicine by mouth with a drink of water. Follow the directions on your prescription label. You can take it with or without food. If it upsets your stomach, take it with food. Do not take your medicine more often than directed. Remember that you will need to pass more urine after taking this medicine. Do not take your doses at a time of day that will cause you problems. Do not take at bedtime. Talk to your pediatrician regarding the use of this medicine in children. While this drug may be prescribed for selected conditions, precautions do  apply. Overdosage: If you think you have taken too much of this medicine contact a poison control center or emergency room at once. NOTE: This medicine is only for you. Do not share this medicine with others. What if I miss a dose? If you miss a dose, take it as soon as you can. If it is almost time for your next dose, take only that dose. Do not take double or extra doses. What may interact with this medicine? Do not take this medicine with any of the following medications: -eplerenone This medicine may also interact with the following medications: -corticosteroids -digoxin -lithium -medicines for high blood pressure like ACE inhibitors -skeletal muscle relaxants like tubocurarine -NSAIDs, medicines for pain and inflammation, like ibuprofen or naproxen -potassium products like salt substitute or supplements -pressor amines like norepinephrine -some diuretics This list may not describe all possible interactions. Give your health care provider a list of all the medicines, herbs, non-prescription drugs, or dietary supplements you use. Also tell them if you smoke, drink alcohol, or use illegal drugs. Some items may interact with your medicine. What should I watch for while using this medicine? Visit your doctor or health care professional for regular checks on your progress. Check your blood pressure as directed. Ask your doctor what your blood pressure should be, and when you should contact them. You may need to be on a special diet while taking this medicine. Ask your doctor. Also,  ask how many glasses of fluid you need to drink a day. You must not get dehydrated. This medicine may make you feel confused, dizzy or lightheaded. Drinking alcohol and taking some medicines can make this worse. Do not drive, use machinery, or do anything that needs mental alertness until you know how this medicine affects you. Do not sit or stand up quickly. What side effects may I notice from receiving this  medicine? Side effects that you should report to your doctor or health care professional as soon as possible: -allergic reactions such as skin rash or itching, hives, swelling of the lips, mouth, tongue, or throat -black or tarry stools -fast, irregular heartbeat -fever -muscle pain, cramps -numbness, tingling in hands or feet -trouble breathing -trouble passing urine -unusual bleeding -unusually weak or tired Side effects that usually do not require medical attention (report to your doctor or health care professional if they continue or are bothersome): -change in voice or hair growth -confusion -dizzy, drowsy -dry mouth, increased thirst -enlarged or tender breasts -headache -irregular menstrual periods -sexual difficulty, unable to have an erection -stomach upset This list may not describe all possible side effects. Call your doctor for medical advice about side effects. You may report side effects to FDA at 1-800-FDA-1088. Where should I keep my medicine? Keep out of the reach of children. Store below 25 degrees C (77 degrees F). Throw away any unused medicine after the expiration date. NOTE: This sheet is a summary. It may not cover all possible information. If you have questions about this medicine, talk to your doctor, pharmacist, or health care provider.  2017 [employer]vier/[employer] Standard (2010-07-09 12:51:30)  Health Maintenance, Female Introduction Adopting a healthy lifestyle and getting preventive care can go a long way to promote health and wellness. Talk with your health care provider about what schedule of regular examinations is right for you. This is a good chance for you to check in with your provider about disease prevention and staying healthy. In between checkups, there are plenty of things you can do on your own. Experts have done a lot of research about which lifestyle changes and preventive measures are most likely to keep you healthy. Ask your health care provider  for more information. Weight and diet Eat a healthy diet  Be sure to include plenty of vegetables, fruits, low-fat dairy products, and lean protein.  Do not eat a lot of foods high in solid fats, added sugars, or salt.  Get regular exercise. This is one of the most important things you can do for your health.  Most adults should exercise for at least 150 minutes each week. The exercise should increase your heart rate and make you sweat (moderate-intensity exercise).  Most adults should also do strengthening exercises at least twice a week. This is in addition to the moderate-intensity exercise. Maintain a healthy weight  Body mass index (BMI) is a measurement that can be used to identify possible weight problems. It estimates body fat based on height and weight. Your health care provider can help determine your BMI and help you achieve or maintain a healthy weight.  For females 70 years of age and older:  A BMI below 18.5 is considered underweight.  A BMI of 18.5 to 24.9 is normal.  A BMI of 25 to 29.9 is considered overweight.  A BMI of 30 and above is considered obese. Watch levels of cholesterol and blood lipids  You should start having your blood tested for lipids and cholesterol at  59 years of age, then have this test every 5 years.  You may need to have your cholesterol levels checked more often if:  Your lipid or cholesterol levels are high.  You are older than 59 years of age.  You are at high risk for heart disease. Cancer screening Lung Cancer  Lung cancer screening is recommended for adults 45-52 years old who are at high risk for lung cancer because of a history of smoking.  A yearly low-dose CT scan of the lungs is recommended for people who:  Currently smoke.  Have quit within the past 15 years.  Have at least a 30-pack-year history of smoking. A pack year is smoking an average of one pack of cigarettes a day for 1 year.  Yearly screening should  continue until it has been 15 years since you quit.  Yearly screening should stop if you develop a health problem that would prevent you from having lung cancer treatment. Breast Cancer  Practice breast self-awareness. This means understanding how your breasts normally appear and feel.  It also means doing regular breast self-exams. Let your health care provider know about any changes, no matter how small.  If you are in your 20s or 30s, you should have a clinical breast exam (CBE) by a health care provider every 1-3 years as part of a regular health exam.  If you are 81 or older, have a CBE every year. Also consider having a breast X-ray (mammogram) every year.  If you have a family history of breast cancer, talk to your health care provider about genetic screening.  If you are at high risk for breast cancer, talk to your health care provider about having an MRI and a mammogram every year.  Breast cancer gene (BRCA) assessment is recommended for women who have family members with BRCA-related cancers. BRCA-related cancers include:  Breast.  Ovarian.  Tubal.  Peritoneal cancers.  Results of the assessment will determine the need for genetic counseling and BRCA1 and BRCA2 testing. Cervical Cancer  Your health care provider may recommend that you be screened regularly for cancer of the pelvic organs (ovaries, uterus, and vagina). This screening involves a pelvic examination, including checking for microscopic changes to the surface of your cervix (Pap test). You may be encouraged to have this screening done every 3 years, beginning at age 6.  For women ages 38-65, health care providers may recommend pelvic exams and Pap testing every 3 years, or they may recommend the Pap and pelvic exam, combined with testing for human papilloma virus (HPV), every 5 years. Some types of HPV increase your risk of cervical cancer. Testing for HPV may also be done on women of any age with unclear Pap test  results.  Other health care providers may not recommend any screening for nonpregnant women who are considered low risk for pelvic cancer and who do not have symptoms. Ask your health care provider if a screening pelvic exam is right for you.  If you have had past treatment for cervical cancer or a condition that could lead to cancer, you need Pap tests and screening for cancer for at least 20 years after your treatment. If Pap tests have been discontinued, your risk factors (such as having a new sexual partner) need to be reassessed to determine if screening should resume. Some women have medical problems that increase the chance of getting cervical cancer. In these cases, your health care provider may recommend more frequent screening and Pap tests. Colorectal  Cancer  This type of cancer can be detected and often prevented.  Routine colorectal cancer screening usually begins at 59 years of age and continues through 59 years of age.  Your health care provider may recommend screening at an earlier age if you have risk factors for colon cancer.  Your health care provider may also recommend using home test kits to check for hidden blood in the stool.  A small camera at the end of a tube can be used to examine your colon directly (sigmoidoscopy or colonoscopy). This is done to check for the earliest forms of colorectal cancer.  Routine screening usually begins at age 32.  Direct examination of the colon should be repeated every 5-10 years through 59 years of age. However, you may need to be screened more often if early forms of precancerous polyps or small growths are found. Skin Cancer  Check your skin from head to toe regularly.  Tell your health care provider about any new moles or changes in moles, especially if there is a change in a mole's shape or color.  Also tell your health care provider if you have a mole that is larger than the size of a pencil eraser.  Always use sunscreen.  Apply sunscreen liberally and repeatedly throughout the day.  Protect yourself by wearing long sleeves, pants, a wide-brimmed hat, and sunglasses whenever you are outside. Heart disease, diabetes, and high blood pressure  High blood pressure causes heart disease and increases the risk of stroke. High blood pressure is more likely to develop in:  People who have blood pressure in the high end of the normal range (130-139/85-89 mm Hg).  People who are overweight or obese.  People who are African American.  If you are 3-23 years of age, have your blood pressure checked every 3-5 years. If you are 81 years of age or older, have your blood pressure checked every year. You should have your blood pressure measured twice-once when you are at a hospital or clinic, and once when you are not at a hospital or clinic. Record the average of the two measurements. To check your blood pressure when you are not at a hospital or clinic, you can use:  An automated blood pressure machine at a pharmacy.  A home blood pressure monitor.  If you are between 13 years and 42 years old, ask your health care provider if you should take aspirin to prevent strokes.  Have regular diabetes screenings. This involves taking a blood sample to check your fasting blood sugar level.  If you are at a normal weight and have a low risk for diabetes, have this test once every three years after 59 years of age.  If you are overweight and have a high risk for diabetes, consider being tested at a younger age or more often. Preventing infection Hepatitis B  If you have a higher risk for hepatitis B, you should be screened for this virus. You are considered at high risk for hepatitis B if:  You were born in a country where hepatitis B is common. Ask your health care provider which countries are considered high risk.  Your parents were born in a high-risk country, and you have not been immunized against hepatitis B (hepatitis B  vaccine).  You have HIV or AIDS.  You use needles to inject street drugs.  You live with someone who has hepatitis B.  You have had sex with someone who has hepatitis B.  You get hemodialysis  treatment.  You take certain medicines for conditions, including cancer, organ transplantation, and autoimmune conditions. Hepatitis C  Blood testing is recommended for:  Everyone born from [birth year] through 1964.  Anyone with known risk factors for hepatitis C. Sexually transmitted infections (STIs)  You should be screened for sexually transmitted infections (STIs) including gonorrhea and chlamydia if:  You are sexually active and are younger than 59 years of age.  You are older than 59 years of age and your health care provider tells you that you are at risk for this type of infection.  Your sexual activity has changed since you were last screened and you are at an increased risk for chlamydia or gonorrhea. Ask your health care provider if you are at risk.  If you do not have HIV, but are at risk, it may be recommended that you take a prescription medicine daily to prevent HIV infection. This is called pre-exposure prophylaxis (PrEP). You are considered at risk if:  You are sexually active and do not regularly use condoms or know the HIV status of your partner(s).  You take drugs by injection.  You are sexually active with a partner who has HIV. Talk with your health care provider about whether you are at high risk of being infected with HIV. If you choose to begin PrEP, you should first be tested for HIV. You should then be tested every 3 months for as long as you are taking PrEP. Pregnancy  If you are premenopausal and you may become pregnant, ask your health care provider about preconception counseling.  If you may become pregnant, take 400 to 800 micrograms (mcg) of folic acid every day.  If you want to prevent pregnancy, talk to your health care provider about birth control  (contraception). Osteoporosis and menopause  Osteoporosis is a disease in which the bones lose minerals and strength with aging. This can result in serious bone fractures. Your risk for osteoporosis can be identified using a bone density scan.  If you are 45 years of age or older, or if you are at risk for osteoporosis and fractures, ask your health care provider if you should be screened.  Ask your health care provider whether you should take a calcium or vitamin D supplement to lower your risk for osteoporosis.  Menopause may have certain physical symptoms and risks.  Hormone replacement therapy may reduce some of these symptoms and risks. Talk to your health care provider about whether hormone replacement therapy is right for you. Follow these instructions at home:  Schedule regular health, dental, and eye exams.  Stay current with your immunizations.  Do not use any tobacco products including cigarettes, chewing tobacco, or electronic cigarettes.  If you are pregnant, do not drink alcohol.  If you are breastfeeding, limit how much and how often you drink alcohol.  Limit alcohol intake to no more than 1 drink per day for nonpregnant women. One drink equals 12 ounces of beer, 5 ounces of wine, or 1 ounces of hard liquor.  Do not use street drugs.  Do not share needles.  Ask your health care provider for help if you need support or information about quitting drugs.  Tell your health care provider if you often feel depressed.  Tell your health care provider if you have ever been abused or do not feel safe at home. This information is not intended to replace advice given to you by your health care provider. Make sure you discuss any questions you have with  your health care provider. Document Released: 05/12/2011 Document Revised: 04/03/2016 Document Reviewed: 07/31/2015  2017 [employer]vier

## 2016-10-01 NOTE — Progress Notes (Addendum)
Patient ID: Amy Petty, female    DOB: 30-Jul-1956  Age: 60 y.o. MRN: [id number]  The patient is here for annual  wellness examination and management of other chronic and acute problems.   PAP normal 2016 Mammogram done Colon 2009 The risk factors are reflected in the social history.  The roster of all physicians providing medical care to patient - is listed in the Snapshot section of the chart.  Home safety : The patient has smoke detectors in the home. They wear seatbelts.  There are no firearms at home. There is no violence in the home.   There is no risks for hepatitis, STDs or HIV. There is no   history of blood transfusion. They have no travel history to infectious disease endemic areas of the world.  The patient has seen their dentist in the last six month. They have seen their eye doctor in the last year.    They do not  have excessive sun exposure. Discussed the need for sun protection: hats, long sleeves and use of sunscreen if there is significant sun exposure.   Diet: the importance of a healthy diet is discussed. They do have a healthy diet.  The benefits of regular aerobic exercise were discussed. She walks 4 times per week ,  20 minutes.   Depression screen: there are no signs or vegative symptoms of depression- irritability, change in appetite, anhedonia, sadness/tearfullness.  The following portions of the patient's history were reviewed and updated as appropriate: allergies, current medications, past family history, past medical history,  past surgical history, past social history  and problem list.  Visual acuity was not assessed per patient preference since she has regular follow up with her ophthalmologist. Hearing and body mass index were assessed and reviewed.   During the course of the visit the patient was educated and counseled about appropriate screening and preventive services including : fall prevention , diabetes screening, nutrition counseling, colorectal  cancer screening, and recommended immunizations.    CC: The primary encounter diagnosis was Routine general medical examination at a health care facility. Diagnoses of Chronic midline low back pain without sciatica, Essential hypertension, Anemia in other chronic diseases classified elsewhere, Class 2 obesity due to excess calories without serious comorbidity with body mass index (BMI) of 35.0 to 35.9 in adult, Vitamin D deficiency, SVT (supraventricular tachycardia) ([place]), and Hyperlipidemia LDL goal <100 were also pertinent to this visit.  Had a cardiac ablation done a year ago  Dec 22, for management of SVT<   Patient was not properly sedated for procedure and felt every zap.    Taking aleve and motrin with tylenol for back pain ,  BP elevated . Pain radiates to both hips and inner thighs.  MRI shows L3 foraminal narrowing  Still losing ab excessive amount of hair.    History Amy Petty has a past medical history of Anemia; Diastolic dysfunction; Fibroid, uterus; Hypertension; Hypothyroidism; Obesity; PVC's (premature ventricular contractions); and SVT (supraventricular tachycardia) ([place]).   She has a past surgical history that includes Tubal ligation; Carpal tunnel release; Tonsillectomy and adenoidectomy; and Carpal tunnel release (2008).   Her family history includes Breast cancer (age of onset: 1) in her mother; Cancer in her father and mother; Diabetes in her sister; Liver cancer in her father; Lung cancer in her mother.She reports that she has never smoked. She has never used smokeless tobacco. She reports that she does not drink alcohol or use drugs.  Outpatient Medications Prior to Visit  Medication  Sig Dispense Refill  . acetaminophen (TYLENOL) 650 MG CR tablet Take 650 mg by mouth every 8 (eight) hours.    . Melatonin 2.5 MG CHEW Chew 1 tablet by mouth at bedtime.    . Multiple Vitamin (MULTIVITAMIN) tablet Take 1 tablet by mouth daily.      . Multiple Vitamins-Minerals  (HAIR/SKIN/NAILS PO) Take by mouth daily.      . mupirocin cream (BACTROBAN) 2 % APPLY 1 APPLICATION TOPICALLY 2 (TWO) TIMES DAILY. 15 g 1  . omeprazole (PRILOSEC) 40 MG capsule TAKE 1 CAPSULE (40 MG TOTAL) BY MOUTH DAILY. 30 capsule 3  . ergocalciferol (DRISDOL) 50000 UNITS capsule Take 1 capsule (50,000 Units total) by mouth once a week. 12 capsule 0  . hydrochlorothiazide (HYDRODIURIL) 25 MG tablet TAKE 1 TABLET BY MOUTH ONCE DAILY 90 tablet 0  . metoprolol succinate (TOPROL-XL) 100 MG 24 hr tablet TAKE ONE TABLET BY MOUTH ONE TIME DAILY WITH FOOD OR IMMEDIATELY FOLLOWING A MEAL 90 tablet 0  . traMADol (ULTRAM) 50 MG tablet Take 1 tablet (50 mg total) by mouth every 8 (eight) hours as needed. 90 tablet 3  . HYDROcodone-acetaminophen (NORCO/VICODIN) 5-325 MG tablet Take 1-2 tablets by mouth at bedtime as needed for moderate pain. (Patient not taking: Reported on 10/01/2016) 60 tablet 0  . meloxicam (MOBIC) 15 MG tablet Take 1 tablet (15 mg total) by mouth daily. (Patient not taking: Reported on 10/01/2016) 30 tablet 0  . naproxen (NAPROSYN) 500 MG tablet TAKE ONE TABLET BY MOUTH ONE TIME DAILY (Patient not taking: Reported on 10/01/2016) 30 tablet 3   No facility-administered medications prior to visit.     Review of Systems   Patient denies headache, fevers, malaise, unintentional weight loss, skin rash, eye pain, sinus congestion and sinus pain, sore throat, dysphagia,  hemoptysis , cough, dyspnea, wheezing, chest pain, palpitations, orthopnea, edema, abdominal pain, nausea, melena, diarrhea, constipation, flank pain, dysuria, hematuria, urinary  Frequency, nocturia, numbness, tingling, seizures,  Focal weakness, Loss of consciousness,  Tremor, insomnia, depression, anxiety, and suicidal ideation.     Objective:  BP (!) 130/98   Pulse 96   Temp 97.4 F (36.3 C) (Oral)   Resp 12   Ht 5' 7.5" (1.715 m)   Wt 289 lb 4 oz (131.2 kg)   LMP 11/23/2013   SpO2 96%   BMI 44.63 kg/m    Physical Exam   General appearance: alert, cooperative and appears stated age Head: Normocephalic, without obvious abnormality, atraumatic Eyes: conjunctivae/corneas clear. PERRL, EOM's intact. Fundi benign. Ears: normal TM's and external ear canals both ears Nose: Nares normal. Septum midline. Mucosa normal. No drainage or sinus tenderness. Throat: lips, mucosa, and tongue normal; teeth and gums normal Neck: no adenopathy, no carotid bruit, no JVD, supple, symmetrical, trachea midline and thyroid not enlarged, symmetric, no tenderness/mass/nodules Lungs: clear to auscultation bilaterally Breasts: pendulous appearance, no masses or tenderness Heart: regular rate and rhythm, S1, S2 normal, no murmur, click, rub or gallop Abdomen: soft, non-tender; bowel sounds normal; no masses,  no organomegaly Extremities: extremities normal, atraumatic, no cyanosis or edema Pulses: 2+ and symmetric Skin: Skin color, texture, turgor normal. No rashes or lesions Neurologic: Alert and oriented X 3, normal strength and tone. Normal symmetric reflexes. Normal coordination and gait.      Assessment & Plan:   Problem List Items Addressed This Visit    Anemia   HTN (hypertension)    Elevated , possibly due to NSAID use  Suspend NSAIDs and recheck.  Changing hctz to spironolactone given concurrent hair loss       Relevant Medications   spironolactone (ALDACTONE) 50 MG tablet   Hyperlipidemia LDL goal <100    10 year risk 11%  .  Simvastatin advised.       Relevant Medications   spironolactone (ALDACTONE) 50 MG tablet   Low back pain    Using tramadol,  Adding vicodin  for bedtime. Referral to Orthopedics for ESI       Relevant Medications   naproxen sodium (ANAPROX) 220 MG tablet   ibuprofen (ADVIL,MOTRIN) 200 MG tablet   traMADol (ULTRAM) 50 MG tablet   Other Relevant Orders   Ambulatory referral to Orthopedic Surgery   Obesity   Routine general medical examination at a health care  facility - Primary    Annual comprehensive preventive exam was done as well as an evaluation and management of chronic conditions .  During the course of the visit the patient was educated and counseled about appropriate screening and preventive services including :  diabetes screening, lipid analysis with projected  10 year  risk for CAD , nutrition counseling, breast, cervical and colorectal cancer screening, and recommended immunizations.  Printed recommendations for health maintenance screenings was give      SVT (supraventricular tachycardia) ([place])    S/p cardiac ablation dec 2016      Relevant Medications   spironolactone (ALDACTONE) 50 MG tablet    Other Visit Diagnoses    Vitamin D deficiency          I have discontinued Ms. Ratajczak ergocalciferol and hydrochlorothiazide. I am also having her start on spironolactone. Additionally, I am having her maintain her multivitamin, Multiple Vitamins-Minerals (HAIR/SKIN/NAILS PO), Melatonin, acetaminophen, HYDROcodone-acetaminophen, naproxen, mupirocin cream, omeprazole, meloxicam, naproxen sodium, ibuprofen, and traMADol.  Meds ordered this encounter  Medications  . naproxen sodium (ANAPROX) 220 MG tablet    Sig: Take 220 mg by mouth 2 (two) times daily with a meal.  . ibuprofen (ADVIL,MOTRIN) 200 MG tablet    Sig: Take 400 mg by mouth every 6 (six) hours as needed.  . traMADol (ULTRAM) 50 MG tablet    Sig: Take 1 tablet (50 mg total) by mouth every 8 (eight) hours as needed.    Dispense:  120 tablet    Refill:  5  . spironolactone (ALDACTONE) 50 MG tablet    Sig: Take 1 tablet (50 mg total) by mouth daily.    Dispense:  90 tablet    Refill:  1    Medications Discontinued During This Encounter  Medication Reason  . ergocalciferol (DRISDOL) 50000 UNITS capsule Completed Course  . traMADol (ULTRAM) 50 MG tablet Reorder  . hydrochlorothiazide (HYDRODIURIL) 25 MG tablet     Follow-up: No Follow-up on file.   Crecencio Mc,  MD

## 2016-10-04 NOTE — Assessment & Plan Note (Signed)
Annual comprehensive preventive exam was done as well as an evaluation and management of chronic conditions .  During the course of the visit the patient was educated and counseled about appropriate screening and preventive services including :  diabetes screening, lipid analysis with projected  10 year  risk for CAD , nutrition counseling, breast, cervical and colorectal cancer screening, and recommended immunizations.  Printed recommendations for health maintenance screenings was give 

## 2016-10-04 NOTE — Assessment & Plan Note (Signed)
Elevated , possibly due to NSAID use  Suspend NSAIDs and recheck.  Changing hctz to spironolactone given concurrent hair loss

## 2016-10-04 NOTE — Assessment & Plan Note (Signed)
S/p cardiac ablation dec 2016

## 2016-10-04 NOTE — Assessment & Plan Note (Addendum)
Using tramadol,  Adding vicodin  for bedtime. Referral to Orthopedics for [facility]

## 2016-10-05 ENCOUNTER — Other Ambulatory Visit: Payer: Self-pay | Admitting: Internal Medicine

## 2016-10-05 DIAGNOSIS — E1169 Type 2 diabetes mellitus with other specified complication: Secondary | ICD-10-CM | POA: Insufficient documentation

## 2016-10-05 DIAGNOSIS — Z79899 Other long term (current) drug therapy: Secondary | ICD-10-CM

## 2016-10-05 DIAGNOSIS — E785 Hyperlipidemia, unspecified: Secondary | ICD-10-CM | POA: Insufficient documentation

## 2016-10-05 NOTE — Assessment & Plan Note (Signed)
10 year risk 11%  .  Simvastatin advised.

## 2016-10-07 ENCOUNTER — Encounter: Payer: Self-pay | Admitting: *Deleted

## 2016-10-14 ENCOUNTER — Other Ambulatory Visit: Payer: Self-pay | Admitting: Internal Medicine

## 2016-10-14 NOTE — Telephone Encounter (Signed)
Okay to refill? Last filled on 09/09/16 for #30 with no refills. Last OV: 10/01/16 & no future appt scheduled

## 2016-10-25 ENCOUNTER — Other Ambulatory Visit: Payer: Self-pay | Admitting: Internal Medicine

## 2016-12-21 ENCOUNTER — Other Ambulatory Visit: Payer: Self-pay | Admitting: Internal Medicine

## 2016-12-22 NOTE — Telephone Encounter (Signed)
Last refill on 10/01/16 on Metoprolol and last OV and labs were on 10/01/16. Next appt is 10/12/17.

## 2016-12-22 NOTE — Telephone Encounter (Signed)
REFILED

## 2017-02-06 ENCOUNTER — Other Ambulatory Visit: Payer: Self-pay | Admitting: Internal Medicine

## 2017-02-09 NOTE — Telephone Encounter (Signed)
Patient never responded to e mail from NOvember about cholesterol.  Needs cmet prior to refill of  Meloxicam,  Non fasting

## 2017-02-09 NOTE — Telephone Encounter (Signed)
Spoke with patient she is currently taking pain medication and doesn't need this refilled. She declined labs, thanks

## 2017-02-09 NOTE — Telephone Encounter (Signed)
Last refill was 10/15/16 for #30 with 5 refills, last OV was 10/01/2016 with labs included.  Next appt is 10/12/2017, please advise, thanks

## 2017-02-10 ENCOUNTER — Other Ambulatory Visit: Payer: Self-pay | Admitting: Internal Medicine

## 2017-02-10 NOTE — Telephone Encounter (Signed)
In the future,  Please go into the chart and cancel the medication so the automatic refills do not keep occurring. I have done it on this one.  Thank you

## 2017-02-10 NOTE — Addendum Note (Signed)
Addended by: Crecencio Mc on: 02/10/2017 08:15 AM   Modules accepted: Orders

## 2017-02-10 NOTE — Telephone Encounter (Signed)
Noted thanks °

## 2017-02-11 NOTE — Telephone Encounter (Signed)
OK to refill Bactroban? Last OV 11/17

## 2017-02-11 NOTE — Telephone Encounter (Signed)
refilled 

## 2017-02-20 ENCOUNTER — Telehealth: Payer: Self-pay

## 2017-02-20 MED ORDER — MELOXICAM 15 MG PO TABS: 15.0000 mg | ORAL_TABLET | Freq: Every day | ORAL | 5 refills | Status: DC

## 2017-02-20 NOTE — Telephone Encounter (Signed)
Refill request  for Meloxicam 15 mg 1 qd not on current med list . Please advise

## 2017-02-20 NOTE — Telephone Encounter (Signed)
Because it ran out last week.  As soon as it runs out epic discontinues it.  Refilled and sent

## 2017-03-06 ENCOUNTER — Other Ambulatory Visit: Payer: Self-pay | Admitting: Internal Medicine

## 2017-03-06 MED ORDER — SPIRONOLACTONE 50 MG PO TABS: 50.0000 mg | ORAL_TABLET | Freq: Every day | ORAL | 1 refills | Status: DC

## 2017-03-06 NOTE — Telephone Encounter (Signed)
Refilled, thanks

## 2017-03-06 NOTE — Telephone Encounter (Signed)
Pt called and stated that she needs a refill on her spironolactone (ALDACTONE) 50 MG tablet. Pt states that [place] might have [place] reason for a refill so soon. Please advise, thank you!  [place]  Call pt @ [phone]

## 2017-06-15 ENCOUNTER — Other Ambulatory Visit: Payer: Self-pay | Admitting: Internal Medicine

## 2017-06-15 NOTE — Telephone Encounter (Signed)
Please notify patient that the prescription  was Refilled for 30 days as a courtesy because it has been 9 months since last visit and this is a controlled substance  .  OFFICE VISIT NEEDED prior to any more refills

## 2017-06-15 NOTE — Telephone Encounter (Signed)
Last refill was at [facility] in November 2017, please advise, thanks.  Last one has 5 refills on it.

## 2017-06-19 ENCOUNTER — Other Ambulatory Visit: Payer: Self-pay | Admitting: Internal Medicine

## 2017-06-30 DIAGNOSIS — C4491 Basal cell carcinoma of skin, unspecified: Secondary | ICD-10-CM

## 2017-06-30 HISTORY — DX: Basal cell carcinoma of skin, unspecified: C44.91

## 2017-06-30 NOTE — Telephone Encounter (Signed)
Sent unread message to patient. thanks

## 2017-07-28 ENCOUNTER — Other Ambulatory Visit: Payer: Self-pay | Admitting: Internal Medicine

## 2017-08-10 NOTE — Telephone Encounter (Signed)
Error

## 2017-08-11 ENCOUNTER — Other Ambulatory Visit: Payer: Self-pay | Admitting: Internal Medicine

## 2017-08-12 ENCOUNTER — Other Ambulatory Visit: Payer: Self-pay | Admitting: Internal Medicine

## 2017-08-13 NOTE — Telephone Encounter (Signed)
Last OV 10/01/2016 Next OV 10/12/2017 Last refill 06/15/2017

## 2017-08-13 NOTE — Telephone Encounter (Signed)
I cannot refill without seeing patient every 6 months .  This is a controlled substance and was already refilled once as a courtesy in August

## 2017-08-31 DIAGNOSIS — L988 Other specified disorders of the skin and subcutaneous tissue: Secondary | ICD-10-CM | POA: Diagnosis not present

## 2017-08-31 DIAGNOSIS — L578 Other skin changes due to chronic exposure to nonionizing radiation: Secondary | ICD-10-CM | POA: Diagnosis not present

## 2017-08-31 DIAGNOSIS — C44311 Basal cell carcinoma of skin of nose: Secondary | ICD-10-CM | POA: Diagnosis not present

## 2017-09-18 ENCOUNTER — Ambulatory Visit (INDEPENDENT_AMBULATORY_CARE_PROVIDER_SITE_OTHER): Payer: 59

## 2017-09-18 DIAGNOSIS — Z23 Encounter for immunization: Secondary | ICD-10-CM | POA: Diagnosis not present

## 2017-10-12 ENCOUNTER — Other Ambulatory Visit: Payer: Self-pay | Admitting: Internal Medicine

## 2017-10-12 ENCOUNTER — Encounter: Payer: BLUE CROSS/BLUE SHIELD | Admitting: Internal Medicine

## 2017-10-12 MED ORDER — TRAMADOL HCL 50 MG PO TABS: 50.0000 mg | ORAL_TABLET | Freq: Three times a day (TID) | ORAL | 1 refills | Status: DC | PRN

## 2017-10-14 ENCOUNTER — Other Ambulatory Visit: Payer: Self-pay | Admitting: Internal Medicine

## 2017-10-14 ENCOUNTER — Encounter: Payer: Self-pay | Admitting: Internal Medicine

## 2017-10-14 DIAGNOSIS — Z1231 Encounter for screening mammogram for malignant neoplasm of breast: Secondary | ICD-10-CM

## 2017-10-20 ENCOUNTER — Other Ambulatory Visit: Payer: Self-pay | Admitting: Internal Medicine

## 2017-11-11 ENCOUNTER — Ambulatory Visit
Admission: RE | Admit: 2017-11-11 | Discharge: 2017-11-11 | Disposition: A | Discharge status: Home / Self Care | Payer: 59 | Source: Ambulatory Visit | Attending: Internal Medicine | Admitting: Internal Medicine

## 2017-11-11 DIAGNOSIS — Z1231 Encounter for screening mammogram for malignant neoplasm of breast: Secondary | ICD-10-CM | POA: Insufficient documentation

## 2017-11-12 ENCOUNTER — Encounter: Payer: Self-pay | Admitting: Internal Medicine

## 2017-11-16 ENCOUNTER — Encounter: Payer: Self-pay | Admitting: Internal Medicine

## 2017-11-16 ENCOUNTER — Ambulatory Visit (INDEPENDENT_AMBULATORY_CARE_PROVIDER_SITE_OTHER): Payer: 59 | Admitting: Internal Medicine

## 2017-11-16 VITALS — BP 138/90 | HR 88 | Temp 97.9°F | Resp 15 | Ht 67.5 in | Wt 278.0 lb

## 2017-11-16 DIAGNOSIS — R7301 Impaired fasting glucose: Secondary | ICD-10-CM

## 2017-11-16 DIAGNOSIS — Z0000 Encounter for general adult medical examination without abnormal findings: Secondary | ICD-10-CM | POA: Diagnosis not present

## 2017-11-16 DIAGNOSIS — Z6841 Body Mass Index (BMI) 40.0 and over, adult: Secondary | ICD-10-CM

## 2017-11-16 DIAGNOSIS — I471 Supraventricular tachycardia: Secondary | ICD-10-CM

## 2017-11-16 DIAGNOSIS — E785 Hyperlipidemia, unspecified: Secondary | ICD-10-CM

## 2017-11-16 DIAGNOSIS — R5383 Other fatigue: Secondary | ICD-10-CM

## 2017-11-16 DIAGNOSIS — E559 Vitamin D deficiency, unspecified: Secondary | ICD-10-CM | POA: Diagnosis not present

## 2017-11-16 DIAGNOSIS — R05 Cough: Secondary | ICD-10-CM

## 2017-11-16 DIAGNOSIS — I10 Essential (primary) hypertension: Secondary | ICD-10-CM | POA: Diagnosis not present

## 2017-11-16 DIAGNOSIS — R059 Cough, unspecified: Secondary | ICD-10-CM

## 2017-11-16 MED ORDER — TRAMADOL HCL 50 MG PO TABS: 50.0000 mg | ORAL_TABLET | Freq: Three times a day (TID) | ORAL | 1 refills | Status: DC | PRN

## 2017-11-16 MED ORDER — AMLODIPINE BESYLATE 2.5 MG PO TABS: 2.5000 mg | ORAL_TABLET | Freq: Every day | ORAL | 3 refills | Status: DC

## 2017-11-16 MED ORDER — BENZONATATE 200 MG PO CAPS: 200.0000 mg | ORAL_CAPSULE | Freq: Three times a day (TID) | ORAL | 1 refills | Status: DC | PRN

## 2017-11-16 MED ORDER — MUPIROCIN 2 % EX OINT: 1.0000 "application " | TOPICAL_OINTMENT | Freq: Two times a day (BID) | CUTANEOUS | 0 refills | Status: DC

## 2017-11-16 MED ORDER — PROMETHAZINE-CODEINE 6.25-10 MG/5ML PO SYRP: 10.0000 mL | ORAL_SOLUTION | Freq: Four times a day (QID) | ORAL | 0 refills | Status: DC | PRN

## 2017-11-16 MED ORDER — PHENTERMINE HCL 37.5 MG PO TABS: 37.5000 mg | ORAL_TABLET | Freq: Every day | ORAL | 2 refills | Status: DC

## 2017-11-16 NOTE — Progress Notes (Signed)
Patient ID: Amy Petty, female    DOB: 1956/03/31  Age: 62 y.o. MRN: [id number]  The patient is here for annual  examination and management of other chronic and acute problems.   Mammogram was normal  Jan 2019  PAP mere normal 2016. due Nov 2019 Colonoscopy due every ten years ,  Due Dec 2019.  Wants to see [facility] in [place]    The risk factors are reflected in the social history.  The roster of all physicians providing medical care to patient - is listed in the Snapshot section of the chart.  Activities of daily living:  The patient is 100% independent in all ADLs: dressing, toileting, feeding as well as independent mobility  Home safety : The patient has smoke detectors in the home. They wear seatbelts.  There are no firearms at home. There is no violence in the home.   There is no risks for hepatitis, STDs or HIV. There is no   history of blood transfusion. They have no travel history to infectious disease endemic areas of the world.  The patient has seen their dentist in the last six month. They have seen their eye doctor in the last year.   They do not  have excessive sun exposure. Discussed the need for sun protection: hats, long sleeves and use of sunscreen if there is significant sun exposure.   Diet: the importance of a healthy diet is discussed. They do have a healthy diet.  The benefits of regular aerobic exercise were discussed. She does not walk or exercise due to back pain    Depression screen: there are no signs or vegative symptoms of depression- irritability, change in appetite, anhedonia, sadness/tearfullness.  The following portions of the patient's history were reviewed and updated as appropriate: allergies, current medications, past family history, past medical history,  past surgical history, past social history  and problem list.  Visual acuity was not assessed per patient preference since she has regular follow up with her ophthalmologist. Hearing and body  mass index were assessed and reviewed.   During the course of the visit the patient was educated and counseled about appropriate screening and preventive services including : fall prevention , diabetes screening, nutrition counseling, colorectal cancer screening, and recommended immunizations.    CC: The primary encounter diagnosis was Essential hypertension. Diagnoses of Vitamin D deficiency, Fatigue, unspecified type, Impaired fasting glucose, Class 3 severe obesity due to excess calories without serious comorbidity with body mass index (BMI) of 40.0 to 44.9 in adult [facility]), Routine general medical examination at a health care facility, Cough, Hyperlipidemia LDL goal <100, and SVT (supraventricular tachycardia) ([place]) were also pertinent to this visit.  Cc: persistent cough, nonproductive.  Has been under treatment for bronchitis by ENT Dr Kathyrn Sheriff  with Biaxin and  12 day prednisone  taper and a codeine cough syrup.  No fevers, chills,  Pleurisy or dyspnea.   low back pain.  Chronic prevents her from exercising  New development of numbness, intermittent in  Right foot involving Toes 2 and 3.  Denies radicular pain.      Wants to resume phentermine for help in losing weight  But has been modifying diet and has lost ten lbs since last visit one year ago. Using Slim Fast shakes once daily,  Portion reduction     History Amy Petty has a past medical history of Anemia, Diastolic dysfunction, Fibroid, uterus, Hypertension, Hypothyroidism, Obesity, PVC's (premature ventricular contractions), and SVT (supraventricular tachycardia) ([place]).   She  has a past surgical history that includes Tubal ligation; Carpal tunnel release; Tonsillectomy and adenoidectomy; and Carpal tunnel release (2008).   Her family history includes Breast cancer (age of onset: 94) in her mother; Cancer in her father and mother; Diabetes in her sister; Liver cancer in her father; Lung cancer in her mother.She reports that  has never smoked.  she has never used smokeless tobacco. She reports that she does not drink alcohol or use drugs.  Outpatient Medications Prior to Visit  Medication Sig Dispense Refill  . acetaminophen (TYLENOL) 650 MG CR tablet Take 650 mg by mouth every 8 (eight) hours.    Marland Kitchen guaiFENesin-codeine 100-10 MG/5ML syrup TAKE 5 TO 10 MLS BY MOUTH EVERY 4 TO 6 HOURS AS NEEDED FOR COUGH    . Melatonin 2.5 MG CHEW Chew 1 tablet by mouth at bedtime.    . meloxicam (MOBIC) 15 MG tablet TAKE 1 TABLET (15 MG TOTAL) BY MOUTH DAILY. 30 tablet 2  . metoprolol succinate (TOPROL-XL) 100 MG 24 hr tablet TAKE ONE TABLET BY MOUTH ONE TIME DAILY WITH FOOD OR IMMEDIATELY FOLLOWING A MEAL 90 tablet 1  . Multiple Vitamin (MULTIVITAMIN) tablet Take 1 tablet by mouth daily.      . mupirocin cream (BACTROBAN) 2 % APPLY 1 APPLICATION TOPICALLY 2 (TWO) TIMES DAILY. 15 g 1  . omeprazole (PRILOSEC) 40 MG capsule TAKE 1 CAPSULE (40 MG TOTAL) BY MOUTH DAILY. 30 capsule 11  . predniSONE (DELTASONE) 10 MG tablet PLEASE SEE ATTACHED FOR DETAILED DIRECTIONS  0  . spironolactone (ALDACTONE) 50 MG tablet TAKE 1 TABLET BY MOUTH EVERY DAY 90 tablet 1  . traMADol (ULTRAM) 50 MG tablet Take 1 tablet (50 mg total) by mouth every 8 (eight) hours as needed. 90 tablet 1  . HYDROcodone-acetaminophen (NORCO/VICODIN) 5-325 MG tablet Take 1-2 tablets by mouth at bedtime as needed for moderate pain. (Patient not taking: Reported on 10/01/2016) 60 tablet 0  . Multiple Vitamins-Minerals (HAIR/SKIN/NAILS PO) Take by mouth daily.       No facility-administered medications prior to visit.     Review of Systems   Patient denies headache, fevers, malaise, unintentional weight loss, skin rash, eye pain, sinus congestion and sinus pain, sore throat, dysphagia,  hemoptysis ,  dyspnea, wheezing, chest pain, palpitations, orthopnea, edema, abdominal pain, nausea, melena, diarrhea, constipation, flank pain, dysuria, hematuria, urinary  Frequency, nocturia, , tingling,  seizures,  Focal weakness, Loss of consciousness,  Tremor, insomnia, depression, anxiety, and suicidal ideation.      Objective:  BP 138/90 (BP Location: Left Arm, Patient Position: Sitting, Cuff Size: Large)   Pulse 88   Temp 97.9 F (36.6 C) (Oral)   Resp 15   Ht 5' 7.5" (1.715 m)   Wt 278 lb (126.1 kg)   LMP 11/23/2013   SpO2 93%   BMI 42.90 kg/m   Physical Exam   General appearance: obese, alert, cooperative and appears stated age Ears: normal TM's and external ear canals both ears Throat: lips, mucosa, and tongue normal; teeth and gums normal Neck: no adenopathy, no carotid bruit, supple, symmetrical, trachea midline and thyroid not enlarged, symmetric, no tenderness/mass/nodules Back: symmetric, no curvature. ROM normal. No CVA tenderness. Lungs: clear to auscultation bilaterally Heart: regular rate and rhythm, S1, S2 normal, no murmur, click, rub or gallop Abdomen: soft, non-tender; bowel sounds normal; no masses,  no organomegaly Pulses: 2+ and symmetric Skin: rosacea affecting primarily nose  Skin color, texture, turgor normal. No rashes or lesions Lymph nodes: Cervical,  supraclavicular, and axillary nodes normal.    Assessment & Plan:   Problem List Items Addressed This Visit    Cough    Secondary to resolving bronchitis, treated with prednisone and Biaxin by Dr Kathyrn Sheriff. Marland Kitchen  adding tessalon 200 mg tid for suppression       HTN (hypertension) - Primary    iscusseHer BP is not at goal currently on metoprolol 100 mg dose. Discussed need for BP to be under 130/80 before phentermine can be started. .  Medications reviewed and compliance assessed via patient report.  Renal function and electolytes assessed.  Use of NSAIDs, oral decongestants and other medications/supplements reviewed  .  Adding amlodipine 2.5 mg daily       Relevant Medications   amLODipine (NORVASC) 2.5 MG tablet   Other Relevant Orders   Lipid panel   Comprehensive metabolic panel    Hyperlipidemia LDL goal <100    10 year risk 11%  .  Simvastatin advised last year but deferred.  Repeat lipids are pending  Lab Results  Component Value Date   CHOL 227 (H) 10/01/2016   HDL 52.90 10/01/2016   LDLCALC 151 (H) 10/01/2016   TRIG 111.0 10/01/2016   CHOLHDL 4 10/01/2016         Relevant Medications   amLODipine (NORVASC) 2.5 MG tablet   Obesity    I have congratulated her in reduction of   BMI and encouraged  Continued weight loss with goal of  5% of body weigh over the next 3 months using a low glycemic index diet , phentermine for appetite suppression, and regular exercise a minimum of 5 days per week.I have authorized the refill of phentermine for 3 months. Goal is 14 lbs wt loss       Relevant Medications   phentermine (ADIPEX-P) 37.5 MG tablet   Routine general medical examination at a health care facility    Annual comprehensive preventive exam was done as well as an evaluation and management of chronic conditions .  During the course of the visit the patient was educated and counseled about appropriate screening and preventive services including :  diabetes screening, lipid analysis with projected  10 year  risk for CAD , nutrition counseling, breast, cervical and colorectal cancer screening, and recommended immunizations.  Printed recommendations for health maintenance screenings was given.  She is currently up to date on all cancer screenings.  She will need a referral to [facility] for screening colonoscopy in Dec 2019      SVT (supraventricular tachycardia) ([place])    Resolved S/p cardiac ablation dec 2016.  Continue metoprolol       Relevant Medications   amLODipine (NORVASC) 2.5 MG tablet    Other Visit Diagnoses    Vitamin D deficiency       Relevant Orders   VITAMIN D 25 Hydroxy (Vit-D Deficiency, Fractures)   Fatigue, unspecified type       Relevant Orders   Comprehensive metabolic panel   TSH   Impaired fasting glucose       Relevant Orders    Hemoglobin A1c      I have discontinued Audry Riles. Lo's Multiple Vitamins-Minerals (HAIR/SKIN/NAILS PO) and HYDROcodone-acetaminophen. I am also having her start on benzonatate, promethazine-codeine, phentermine, mupirocin ointment, and amLODipine. Additionally, I am having her maintain her multivitamin, Melatonin, acetaminophen, omeprazole, mupirocin cream, metoprolol succinate, spironolactone, meloxicam, guaiFENesin-codeine, predniSONE, and traMADol.  Meds ordered this encounter  Medications  . benzonatate (TESSALON) 200 MG capsule    Sig:  Take 1 capsule (200 mg total) by mouth 3 (three) times daily as needed for cough.    Dispense:  60 capsule    Refill:  1  . promethazine-codeine (PHENERGAN WITH CODEINE) 6.25-10 MG/5ML syrup    Sig: Take 10 mLs by mouth every 6 (six) hours as needed for cough.    Dispense:  180 mL    Refill:  0  . phentermine (ADIPEX-P) 37.5 MG tablet    Sig: Take 1 tablet (37.5 mg total) by mouth daily before breakfast.    Dispense:  30 tablet    Refill:  2  . mupirocin ointment (BACTROBAN) 2 %    Sig: Place 1 application into the nose 2 (two) times daily.    Dispense:  22 g    Refill:  0  . traMADol (ULTRAM) 50 MG tablet    Sig: Take 1 tablet (50 mg total) by mouth every 8 (eight) hours as needed.    Dispense:  90 tablet    Refill:  1    This request is for a new prescription for a controlled substance as required by Federal/State law.  Marland Kitchen amLODipine (NORVASC) 2.5 MG tablet    Sig: Take 1 tablet (2.5 mg total) by mouth daily.    Dispense:  90 tablet    Refill:  3    Medications Discontinued During This Encounter  Medication Reason  . HYDROcodone-acetaminophen (NORCO/VICODIN) 5-325 MG tablet Completed Course  . Multiple Vitamins-Minerals (HAIR/SKIN/NAILS PO) Duplicate  . traMADol (ULTRAM) 50 MG tablet Reorder    Follow-up: No Follow-up on file.   Crecencio Mc, MD

## 2017-11-16 NOTE — Patient Instructions (Addendum)
I have authorized the use of phentermine for 3 months.  Please have your vital signs checked a week after starting, and return to see me in 3 months.  You can take 1`/2 tablet in the morning,  The second half by 2 PM to avoid insomnia, or take the entire tablet in the morning.  Your goal to continue using it is a weight loss of  5% of your starting weight by the end of the  3 months.   Your colonoscopy is due in December.  We will make a referral to a  [facility] in  [place]  Per your request   I have prescribed tessalon (benzonotate ) cough capsules to use during the day ,and a cough syrup with phenergan in it for nighttime suppression of cough (you can use 10 ml dose which is 2 teaspons)  Adding amlodipine 2.5 mg daily for blood pressure   Health Maintenance for Postmenopausal Women Menopause is a normal process in which your reproductive ability comes to an end. This process happens gradually over a span of months to years, usually between the ages of 82 and 106. Menopause is complete when you have missed 12 consecutive menstrual periods. It is important to talk with your health care provider about some of the most common conditions that affect postmenopausal women, such as heart disease, cancer, and bone loss (osteoporosis). Adopting a healthy lifestyle and getting preventive care can help to promote your health and wellness. Those actions can also lower your chances of developing some of these common conditions. What should I know about menopause? During menopause, you may experience a number of symptoms, such as:  Moderate-to-severe hot flashes.  Night sweats.  Decrease in sex drive.  Mood swings.  Headaches.  Tiredness.  Irritability.  Memory problems.  Insomnia.  Choosing to treat or not to treat menopausal changes is an individual decision that you make with your health care provider. What should I know about hormone replacement therapy and  supplements? Hormone therapy products are effective for treating symptoms that are associated with menopause, such as hot flashes and night sweats. Hormone replacement carries certain risks, especially as you become older. If you are thinking about using estrogen or estrogen with progestin treatments, discuss the benefits and risks with your health care provider. What should I know about heart disease and stroke? Heart disease, heart attack, and stroke become more likely as you age. This may be due, in part, to the hormonal changes that your body experiences during menopause. These can affect how your body processes dietary fats, triglycerides, and cholesterol. Heart attack and stroke are both medical emergencies. There are many things that you can do to help prevent heart disease and stroke:  Have your blood pressure checked at least every 1-2 years. High blood pressure causes heart disease and increases the risk of stroke.  If you are 61-34 years old, ask your health care provider if you should take aspirin to prevent a heart attack or a stroke.  Do not use any tobacco products, including cigarettes, chewing tobacco, or electronic cigarettes. If you need help quitting, ask your health care provider.  It is important to eat a healthy diet and maintain a healthy weight. ? Be sure to include plenty of vegetables, fruits, low-fat dairy products, and lean protein. ? Avoid eating foods that are high in solid fats, added sugars, or salt (sodium).  Get regular exercise. This is one of the most important things that you can do for your  health. ? Try to exercise for at least 150 minutes each week. The type of exercise that you do should increase your heart rate and make you sweat. This is known as moderate-intensity exercise. ? Try to do strengthening exercises at least twice each week. Do these in addition to the moderate-intensity exercise.  Know your numbers.Ask your health care provider to check  your cholesterol and your blood glucose. Continue to have your blood tested as directed by your health care provider.  What should I know about cancer screening? There are several types of cancer. Take the following steps to reduce your risk and to catch any cancer development as early as possible. Breast Cancer  Practice breast self-awareness. ? This means understanding how your breasts normally appear and feel. ? It also means doing regular breast self-exams. Let your health care provider know about any changes, no matter how small.  If you are 18 or older, have a clinician do a breast exam (clinical breast exam or CBE) every year. Depending on your age, family history, and medical history, it may be recommended that you also have a yearly breast X-ray (mammogram).  If you have a family history of breast cancer, talk with your health care provider about genetic screening.  If you are at high risk for breast cancer, talk with your health care provider about having an MRI and a mammogram every year.  Breast cancer (BRCA) gene test is recommended for women who have family members with BRCA-related cancers. Results of the assessment will determine the need for genetic counseling and BRCA1 and for BRCA2 testing. BRCA-related cancers include these types: ? Breast. This occurs in males or females. ? Ovarian. ? Tubal. This may also be called fallopian tube cancer. ? Cancer of the abdominal or pelvic lining (peritoneal cancer). ? Prostate. ? Pancreatic.  Cervical, Uterine, and Ovarian Cancer Your health care provider may recommend that you be screened regularly for cancer of the pelvic organs. These include your ovaries, uterus, and vagina. This screening involves a pelvic exam, which includes checking for microscopic changes to the surface of your cervix (Pap test).  For women ages 21-65, health care providers may recommend a pelvic exam and a Pap test every three years. For women ages 39-65,  they may recommend the Pap test and pelvic exam, combined with testing for human papilloma virus (HPV), every five years. Some types of HPV increase your risk of cervical cancer. Testing for HPV may also be done on women of any age who have unclear Pap test results.  Other health care providers may not recommend any screening for nonpregnant women who are considered low risk for pelvic cancer and have no symptoms. Ask your health care provider if a screening pelvic exam is right for you.  If you have had past treatment for cervical cancer or a condition that could lead to cancer, you need Pap tests and screening for cancer for at least 20 years after your treatment. If Pap tests have been discontinued for you, your risk factors (such as having a new sexual partner) need to be reassessed to determine if you should start having screenings again. Some women have medical problems that increase the chance of getting cervical cancer. In these cases, your health care provider may recommend that you have screening and Pap tests more often.  If you have a family history of uterine cancer or ovarian cancer, talk with your health care provider about genetic screening.  If you have vaginal bleeding after  reaching menopause, tell your health care provider.  There are currently no reliable tests available to screen for ovarian cancer.  Lung Cancer Lung cancer screening is recommended for adults 47-29 years old who are at high risk for lung cancer because of a history of smoking. A yearly low-dose CT scan of the lungs is recommended if you:  Currently smoke.  Have a history of at least 30 pack-years of smoking and you currently smoke or have quit within the past 15 years. A pack-year is smoking an average of one pack of cigarettes per day for one year.  Yearly screening should:  Continue until it has been 15 years since you quit.  Stop if you develop a health problem that would prevent you from having lung  cancer treatment.  Colorectal Cancer  This type of cancer can be detected and can often be prevented.  Routine colorectal cancer screening usually begins at age 58 and continues through age 64.  If you have risk factors for colon cancer, your health care provider may recommend that you be screened at an earlier age.  If you have a family history of colorectal cancer, talk with your health care provider about genetic screening.  Your health care provider may also recommend using home test kits to check for hidden blood in your stool.  A small camera at the end of a tube can be used to examine your colon directly (sigmoidoscopy or colonoscopy). This is done to check for the earliest forms of colorectal cancer.  Direct examination of the colon should be repeated every 5-10 years until age 34. However, if early forms of precancerous polyps or small growths are found or if you have a family history or genetic risk for colorectal cancer, you may need to be screened more often.  Skin Cancer  Check your skin from head to toe regularly.  Monitor any moles. Be sure to tell your health care provider: ? About any new moles or changes in moles, especially if there is a change in a mole's shape or color. ? If you have a mole that is larger than the size of a pencil eraser.  If any of your family members has a history of skin cancer, especially at a young age, talk with your health care provider about genetic screening.  Always use sunscreen. Apply sunscreen liberally and repeatedly throughout the day.  Whenever you are outside, protect yourself by wearing long sleeves, pants, a wide-brimmed hat, and sunglasses.  What should I know about osteoporosis? Osteoporosis is a condition in which bone destruction happens more quickly than new bone creation. After menopause, you may be at an increased risk for osteoporosis. To help prevent osteoporosis or the bone fractures that can happen because of  osteoporosis, the following is recommended:  If you are 39-8 years old, get at least 1,000 mg of calcium and at least 600 mg of vitamin D per day.  If you are older than age 96 but younger than age 80, get at least 1,200 mg of calcium and at least 600 mg of vitamin D per day.  If you are older than age 10, get at least 1,200 mg of calcium and at least 800 mg of vitamin D per day.  Smoking and excessive alcohol intake increase the risk of osteoporosis. Eat foods that are rich in calcium and vitamin D, and do weight-bearing exercises several times each week as directed by your health care provider. What should I know about how menopause affects  my mental health? Depression may occur at any age, but it is more common as you become older. Common symptoms of depression include:  Low or sad mood.  Changes in sleep patterns.  Changes in appetite or eating patterns.  Feeling an overall lack of motivation or enjoyment of activities that you previously enjoyed.  Frequent crying spells.  Talk with your health care provider if you think that you are experiencing depression. What should I know about immunizations? It is important that you get and maintain your immunizations. These include:  Tetanus, diphtheria, and pertussis (Tdap) booster vaccine.  Influenza every year before the flu season begins.  Pneumonia vaccine.  Shingles vaccine.  Your health care provider may also recommend other immunizations. This information is not intended to replace advice given to you by your health care provider. Make sure you discuss any questions you have with your health care provider. Document Released: 12/19/2005 Document Revised: 05/16/2016 Document Reviewed: 07/31/2015 Elsevier Interactive Patient Education  2018 [employer].

## 2017-11-17 LAB — COMPREHENSIVE METABOLIC PANEL
ALT: 32 U/L (ref 0–35)
AST: 23 U/L (ref 0–37)
Albumin: 4.7 g/dL (ref 3.5–5.2)
Alkaline Phosphatase: 86 U/L (ref 39–117)
BILIRUBIN TOTAL: 0.4 mg/dL (ref 0.2–1.2)
BUN: 25 mg/dL — ABNORMAL HIGH (ref 6–23)
CALCIUM: 10.1 mg/dL (ref 8.4–10.5)
CO2: 25 meq/L (ref 19–32)
Chloride: 98 mEq/L (ref 96–112)
Creatinine, Ser: 0.7 mg/dL (ref 0.40–1.20)
GFR: 90.51 mL/min (ref >60.00)
GLUCOSE: 104 mg/dL — AB (ref 70–99)
Potassium: 4.5 mEq/L (ref 3.5–5.1)
Sodium: 136 mEq/L (ref 135–145)
Total Protein: 8.1 g/dL (ref 6.0–8.3)

## 2017-11-17 LAB — LIPID PANEL
CHOL/HDL RATIO: 3
Cholesterol: 233 mg/dL — ABNORMAL HIGH (ref 0–200)
HDL: 67.4 mg/dL (ref >39.00)
LDL CALC: 145 mg/dL — AB (ref 0–99)
NonHDL: 165.83
Triglycerides: 104 mg/dL (ref 0.0–149.0)
VLDL: 20.8 mg/dL (ref 0.0–40.0)

## 2017-11-17 LAB — TSH: TSH: 1.09 u[IU]/mL (ref 0.35–4.50)

## 2017-11-17 LAB — VITAMIN D 25 HYDROXY (VIT D DEFICIENCY, FRACTURES): VITD: 60.78 ng/mL (ref 30.00–100.00)

## 2017-11-17 LAB — HEMOGLOBIN A1C: HEMOGLOBIN A1C: 6.3 % (ref 4.6–6.5)

## 2017-11-17 NOTE — Assessment & Plan Note (Signed)
Resolved S/p cardiac ablation dec 2016.  Continue metoprolol  

## 2017-11-17 NOTE — Assessment & Plan Note (Addendum)
iscusseHer BP is not at goal currently on metoprolol 100 mg dose. Discussed need for BP to be under 130/80 before phentermine can be started. .  Medications reviewed and compliance assessed via patient report.  Renal function and electolytes assessed.  Use of NSAIDs, oral decongestants and other medications/supplements reviewed  .  Adding amlodipine 2.5 mg daily

## 2017-11-17 NOTE — Assessment & Plan Note (Signed)
Annual comprehensive preventive exam was done as well as an evaluation and management of chronic conditions .  During the course of the visit the patient was educated and counseled about appropriate screening and preventive services including :  diabetes screening, lipid analysis with projected  10 year  risk for CAD , nutrition counseling, breast, cervical and colorectal cancer screening, and recommended immunizations.  Printed recommendations for health maintenance screenings was given.  She is currently up to date on all cancer screenings.  She will need a referral to [facility] for screening colonoscopy in Dec 2019

## 2017-11-17 NOTE — Assessment & Plan Note (Signed)
Secondary to resolving bronchitis, treated with prednisone and Biaxin by Dr Kathyrn Sheriff. Marland Kitchen  adding tessalon 200 mg tid for suppression

## 2017-11-17 NOTE — Assessment & Plan Note (Signed)
I have congratulated her in reduction of   BMI and encouraged  Continued weight loss with goal of  5% of body weigh over the next 3 months using a low glycemic index diet , phentermine for appetite suppression, and regular exercise a minimum of 5 days per week.I have authorized the refill of phentermine for 3 months. Goal is 14 lbs wt loss

## 2017-11-17 NOTE — Assessment & Plan Note (Signed)
10 year risk 11%  .  Simvastatin advised last year but deferred.  Repeat lipids are pending  Lab Results  Component Value Date   CHOL 227 (H) 10/01/2016   HDL 52.90 10/01/2016   LDLCALC 151 (H) 10/01/2016   TRIG 111.0 10/01/2016   CHOLHDL 4 10/01/2016

## 2017-11-18 ENCOUNTER — Encounter: Payer: Self-pay | Admitting: Internal Medicine

## 2017-11-19 ENCOUNTER — Encounter: Payer: Self-pay | Admitting: Internal Medicine

## 2017-12-03 ENCOUNTER — Other Ambulatory Visit: Payer: Self-pay | Admitting: Internal Medicine

## 2018-01-21 ENCOUNTER — Other Ambulatory Visit: Payer: Self-pay | Admitting: Internal Medicine

## 2018-02-12 ENCOUNTER — Encounter: Payer: Self-pay | Admitting: Internal Medicine

## 2018-02-12 ENCOUNTER — Ambulatory Visit: Payer: 59 | Admitting: Internal Medicine

## 2018-02-12 ENCOUNTER — Ambulatory Visit (INDEPENDENT_AMBULATORY_CARE_PROVIDER_SITE_OTHER): Payer: 59

## 2018-02-12 VITALS — BP 116/68 | HR 16 | Temp 98.1°F | Resp 16 | Ht 67.5 in | Wt 285.4 lb

## 2018-02-12 DIAGNOSIS — R059 Cough, unspecified: Secondary | ICD-10-CM

## 2018-02-12 DIAGNOSIS — R062 Wheezing: Secondary | ICD-10-CM

## 2018-02-12 DIAGNOSIS — J4521 Mild intermittent asthma with (acute) exacerbation: Secondary | ICD-10-CM | POA: Diagnosis not present

## 2018-02-12 DIAGNOSIS — R05 Cough: Secondary | ICD-10-CM

## 2018-02-12 MED ORDER — ALBUTEROL SULFATE HFA 108 (90 BASE) MCG/ACT IN AERS: 2.0000 | INHALATION_SPRAY | Freq: Four times a day (QID) | RESPIRATORY_TRACT | 0 refills | Status: DC | PRN

## 2018-02-12 MED ORDER — BENZONATATE 200 MG PO CAPS: 200.0000 mg | ORAL_CAPSULE | Freq: Three times a day (TID) | ORAL | 1 refills | Status: DC | PRN

## 2018-02-12 MED ORDER — MONTELUKAST SODIUM 10 MG PO TABS: 10.0000 mg | ORAL_TABLET | Freq: Every day | ORAL | 3 refills | Status: DC

## 2018-02-12 MED ORDER — BECLOMETHASONE DIPROP HFA 80 MCG/ACT IN AERB: 2.0000 | INHALATION_SPRAY | Freq: Two times a day (BID) | RESPIRATORY_TRACT | 1 refills | Status: DC

## 2018-02-12 MED ORDER — HYDROCOD POLST-CPM POLST ER 10-8 MG/5ML PO SUER: 10.0000 mL | Freq: Every evening | ORAL | 0 refills | Status: DC | PRN

## 2018-02-12 NOTE — Patient Instructions (Signed)
Yo have asthmatic bronchitis:   chestx ray today.   Continue the prednisone and clarithromycin  Add Singulair once daily and allegra (OTC) 180 mg once daily for allergic rhinitis and asthma  Tessalon capsules for daytime cough  Continue tussionex for nighttime cough,  Ok to use 10 ml (1 tablespoon) if needed   Adding albuterol inhaler as needed for wheezing  QVAR (mometasone) steroid inhaler 2 puffs twice daly for 2 weeks

## 2018-02-12 NOTE — Progress Notes (Signed)
Subjective:  Patient ID: Amy Petty, female    DOB: 1956-05-28  Age: 62 y.o. MRN: [id number]  CC: The primary encounter diagnosis was Cough. Diagnoses of Wheezing and Mild intermittent asthmatic bronchitis with acute exacerbation were also pertinent to this visit.  HPI Amy Petty presents for persistent sinusitis and bronchitis despite recent ENT eval and treatment   Woke up Monday (5 days ago)  with non productive cough,  Had been working outside over the weekend before .  Saw Juengel on Tuesday .  started a 12 day prednisone taper (60 mg start)  on Wednesday, along with empiric   clarithromycin and cough suppression using qhs Tussionex   .    history of asthmatic bronchitis. Feels she is Wheezing and chest  Feels tight. No fevers,  No sinsus pain or ear pain no body aches.  Does not have an albuterol MDI . Still coughing at night despite using 1.5 tsp tussionex     Outpatient Medications Prior to Visit  Medication Sig Dispense Refill  . acetaminophen (TYLENOL) 650 MG CR tablet Take 650 mg by mouth every 8 (eight) hours.    Marland Kitchen amLODipine (NORVASC) 2.5 MG tablet Take 1 tablet (2.5 mg total) by mouth daily. 90 tablet 3  . benzonatate (TESSALON) 200 MG capsule Take 1 capsule (200 mg total) by mouth 3 (three) times daily as needed for cough. 60 capsule 1  . chlorpheniramine-HYDROcodone (TUSSIONEX) 10-8 MG/5ML SUER TAKE 2.5-5MLS BY MOUTH EVERY 12 HOURS AS NEEDED FOR SEVERE COUGH  0  . clarithromycin (BIAXIN) 500 MG tablet TAKE ONE TABLET BY MOUTH TWO TIMES DAILY FOR 10 DAYS  0  . guaiFENesin-codeine 100-10 MG/5ML syrup TAKE 5 TO 10 MLS BY MOUTH EVERY 4 TO 6 HOURS AS NEEDED FOR COUGH    . Melatonin 2.5 MG CHEW Chew 1 tablet by mouth at bedtime.    . meloxicam (MOBIC) 15 MG tablet TAKE 1 TABLET (15 MG TOTAL) BY MOUTH DAILY. 30 tablet 2  . metoprolol succinate (TOPROL-XL) 100 MG 24 hr tablet TAKE ONE TABLET BY MOUTH ONE TIME DAILY WITH FOOD OR IMMEDIATELY FOLLOWING A MEAL 90 tablet 1  .  Multiple Vitamin (MULTIVITAMIN) tablet Take 1 tablet by mouth daily.      . mupirocin cream (BACTROBAN) 2 % APPLY 1 APPLICATION TOPICALLY 2 (TWO) TIMES DAILY. 15 g 1  . mupirocin ointment (BACTROBAN) 2 % Place 1 application into the nose 2 (two) times daily. 22 g 0  . omeprazole (PRILOSEC) 40 MG capsule TAKE 1 CAPSULE EVERY DAY 90 capsule 1  . phentermine (ADIPEX-P) 37.5 MG tablet Take 1 tablet (37.5 mg total) by mouth daily before breakfast. 30 tablet 2  . predniSONE (DELTASONE) 10 MG tablet PLEASE SEE ATTACHED FOR DETAILED DIRECTIONS  0  . promethazine-codeine (PHENERGAN WITH CODEINE) 6.25-10 MG/5ML syrup Take 10 mLs by mouth every 6 (six) hours as needed for cough. 180 mL 0  . spironolactone (ALDACTONE) 50 MG tablet TAKE 1 TABLET BY MOUTH EVERY DAY 90 tablet 1  . traMADol (ULTRAM) 50 MG tablet Take 1 tablet (50 mg total) by mouth every 8 (eight) hours as needed. 90 tablet 1   No facility-administered medications prior to visit.     Review of Systems;  Patient denies headache, fevers, malaise, unintentional weight loss, skin rash, eye pain, sinus congestion and sinus pain, sore throat, dysphagia,  hemoptysis ,  chest pain, palpitations, orthopnea, edema, abdominal pain, nausea, melena, diarrhea, constipation, flank pain, dysuria, hematuria, urinary  Frequency, nocturia,  numbness, tingling, seizures,  Focal weakness, Loss of consciousness,  Tremor, insomnia, depression, anxiety, and suicidal ideation.      Objective:  BP 116/68 (BP Location: Left Arm, Patient Position: Sitting, Cuff Size: Large)   Pulse (!) 16   Temp 98.1 F (36.7 C) (Oral)   Resp 16   Ht 5' 7.5" (1.715 m)   Wt 285 lb 6.4 oz (129.5 kg)   LMP 11/23/2013   SpO2 95%   BMI 44.04 kg/m   BP Readings from Last 3 Encounters:  02/12/18 116/68  11/16/17 138/90  10/01/16 (!) 130/98    Wt Readings from Last 3 Encounters:  02/12/18 285 lb 6.4 oz (129.5 kg)  11/16/17 278 lb (126.1 kg)  10/01/16 289 lb 4 oz (131.2 kg)     General appearance: alert, cooperative and appears stated age Ears: normal TM's and external ear canals both ears Throat: lips, mucosa, and tongue normal; teeth and gums normal Neck: no adenopathy, no carotid bruit, supple, symmetrical, trachea midline and thyroid not enlarged, symmetric, no tenderness/mass/nodules Back: symmetric, no curvature. ROM normal. No CVA tenderness. Lungs: clear to auscultation bilaterally w/rare wheezing.  Fair air movement  Heart: regular rate and rhythm, S1, S2 normal, no murmur, click, rub or gallop Abdomen: soft, non-tender; bowel sounds normal; no masses,  no organomegaly Pulses: 2+ and symmetric Skin: Skin color, texture, turgor normal. No rashes or lesions Lymph nodes: Cervical, supraclavicular, and axillary nodes normal.  Lab Results  Component Value Date   HGBA1C 6.3 11/16/2017   HGBA1C 5.6 10/01/2016   HGBA1C 5.6 08/15/2013    Lab Results  Component Value Date   CREATININE 0.70 11/16/2017   CREATININE 0.63 10/01/2016   CREATININE 0.64 10/29/2015    Lab Results  Component Value Date   WBC 9.1 10/01/2016   HGB 13.5 10/01/2016   HCT 41.1 10/01/2016   PLT 285.0 10/01/2016   GLUCOSE 104 (H) 11/16/2017   CHOL 233 (H) 11/16/2017   TRIG 104.0 11/16/2017   HDL 67.40 11/16/2017   LDLCALC 145 (H) 11/16/2017   ALT 32 11/16/2017   AST 23 11/16/2017   NA 136 11/16/2017   K 4.5 11/16/2017   CL 98 11/16/2017   CREATININE 0.70 11/16/2017   BUN 25 (H) 11/16/2017   CO2 25 11/16/2017   TSH 1.09 11/16/2017   HGBA1C 6.3 11/16/2017   MICROALBUR <0.7 10/01/2016    Mm Screening Breast Tomo Bilateral  Result Date: 11/11/2017 CLINICAL DATA:  Screening. EXAM: DIGITAL SCREENING BILATERAL MAMMOGRAM WITH 3D TOMO WITH CAD COMPARISON:  Previous exam(s). ACR Breast Density Category b: There are scattered areas of fibroglandular density. FINDINGS: There are no findings suspicious for malignancy. Images were processed with CAD. IMPRESSION: No mammographic  evidence of malignancy. A result letter of this screening mammogram will be mailed directly to the patient. RECOMMENDATION: Screening mammogram in one year. (Code:SM-B-01Y) BI-RADS CATEGORY  1: Negative. Electronically Signed   By: Fidela Salisbury M.D.   On: 11/11/2017 12:58    Assessment & Plan:   Problem List Items Addressed This Visit    Cough - Primary   Relevant Orders   DG Chest 2 View (Completed)   Asthmatic bronchitis with acute exacerbation    Adding steroid inhaler and albuterol MDI.  Refilled tussionex,  Adding tessalon for daytime cough.  Advised to continue clarithomycin and prednisone taper.       Relevant Medications   montelukast (SINGULAIR) 10 MG tablet   albuterol (PROVENTIL HFA;VENTOLIN HFA) 108 (90 Base) MCG/ACT inhaler  beclomethasone (QVAR REDIHALER) 80 MCG/ACT inhaler    Other Visit Diagnoses    Wheezing       Relevant Orders   DG Chest 2 View (Completed)      I am having Amy Petty start on montelukast, albuterol, beclomethasone, benzonatate, and chlorpheniramine-HYDROcodone. I am also having her maintain her multivitamin, Melatonin, acetaminophen, mupirocin cream, spironolactone, meloxicam, guaiFENesin-codeine, predniSONE, benzonatate, promethazine-codeine, phentermine, mupirocin ointment, traMADol, amLODipine, metoprolol succinate, omeprazole, chlorpheniramine-HYDROcodone, and clarithromycin.  Meds ordered this encounter  Medications  . montelukast (SINGULAIR) 10 MG tablet    Sig: Take 1 tablet (10 mg total) by mouth at bedtime.    Dispense:  30 tablet    Refill:  3  . albuterol (PROVENTIL HFA;VENTOLIN HFA) 108 (90 Base) MCG/ACT inhaler    Sig: Inhale 2 puffs into the lungs every 6 (six) hours as needed for wheezing or shortness of breath.    Dispense:  1 Inhaler    Refill:  0  . beclomethasone (QVAR REDIHALER) 80 MCG/ACT inhaler    Sig: Inhale 2 puffs into the lungs 2 (two) times daily.    Dispense:  10.6 g    Refill:  1  . benzonatate  (TESSALON) 200 MG capsule    Sig: Take 1 capsule (200 mg total) by mouth 3 (three) times daily as needed for cough.    Dispense:  60 capsule    Refill:  1  . chlorpheniramine-HYDROcodone (TUSSIONEX PENNKINETIC ER) 10-8 MG/5ML SUER    Sig: Take 10 mLs by mouth at bedtime as needed for cough.    Dispense:  140 mL    Refill:  0    There are no discontinued medications.  Follow-up: No follow-ups on file.   Crecencio Mc, MD

## 2018-02-14 DIAGNOSIS — J45901 Unspecified asthma with (acute) exacerbation: Secondary | ICD-10-CM | POA: Insufficient documentation

## 2018-02-14 NOTE — Assessment & Plan Note (Signed)
Adding steroid inhaler and albuterol MDI.  Refilled tussionex,  Adding tessalon for daytime cough.  Advised to continue clarithomycin and prednisone taper.

## 2018-04-13 ENCOUNTER — Other Ambulatory Visit: Payer: Self-pay | Admitting: Internal Medicine

## 2018-04-15 NOTE — Telephone Encounter (Signed)
Refilled: 11/16/2017 Last OV: 02/12/2018 Next OV: not scheduled

## 2018-04-16 NOTE — Telephone Encounter (Signed)
Printed, signed and faxed.  

## 2018-05-22 ENCOUNTER — Other Ambulatory Visit: Payer: Self-pay | Admitting: Internal Medicine

## 2018-06-09 ENCOUNTER — Other Ambulatory Visit: Payer: Self-pay | Admitting: Internal Medicine

## 2018-06-15 ENCOUNTER — Ambulatory Visit: Payer: 59 | Admitting: Family Medicine

## 2018-06-15 ENCOUNTER — Encounter: Payer: Self-pay | Admitting: Family Medicine

## 2018-06-15 VITALS — BP 120/86 | HR 78 | Temp 98.0°F | Wt 289.4 lb

## 2018-06-15 DIAGNOSIS — S7012XA Contusion of left thigh, initial encounter: Secondary | ICD-10-CM

## 2018-06-15 DIAGNOSIS — W07XXXA Fall from chair, initial encounter: Secondary | ICD-10-CM | POA: Diagnosis not present

## 2018-06-15 NOTE — Progress Notes (Signed)
Subjective:    Patient ID: Amy Petty, female    DOB: December 03, 1955, 62 y.o.   MRN: [id number]  HPI  Patient presents to clinic due to large bruise on the left inner thigh.  States she was standing on a metal chair in the chair began to teeter and she fell off of the chair and upon falling the chair hit her in the left thigh.  She has no issues walking, denies any hip knee or ankle pain.  States bruised area on left thigh is somewhat tender  after being on feet all day at work.  She does not currently take blood thinning medication.  States the reason she came in to be checked was due to concerns over possible blood clot in the bruised area. Patient has not had any recent air travel or very long car rides where she sat for extended periods of time.  She has no history of blood clotting disorder.  Patient Active Problem List   Diagnosis Date Noted  . Asthmatic bronchitis with acute exacerbation 02/14/2018  . Hyperlipidemia LDL goal <100 10/05/2016  . Frequent nosebleeds 09/06/2015  . Low back pain 09/06/2015  . Cough 08/07/2015  . Routine general medical examination at a health care facility 08/11/2012  . Screening for cervical cancer 10/20/2011  . Screening for breast cancer 10/20/2011  . Screening for colon cancer 10/20/2011  . Allergic rhinitis 10/20/2011  . Fibroid, uterus   . SVT (supraventricular tachycardia) ([place]) 08/04/2011  . HTN (hypertension) 08/04/2011  . Obesity 08/04/2011   Social History   Tobacco Use  . Smoking status: Never Smoker  . Smokeless tobacco: Never Used  Substance Use Topics  . Alcohol use: No   Review of Systems  Constitutional: Negative for chills, diaphoresis and fever.  Respiratory: Negative for apnea, cough, chest tightness, shortness of breath and wheezing.   Cardiovascular: Negative for chest pain, palpitations and leg swelling.  Musculoskeletal: Positive for myalgias. Negative for arthralgias.       Sore thigh muscle/hamstring (in bruised  area).    Skin: Positive for color change. Negative for pallor and wound.       Large dark purple bruise left inner/posterior thigh.  Neurological: Negative for dizziness, syncope and light-headedness.      Objective:   Physical Exam  Constitutional: She is oriented to person, place, and time. She appears well-developed and well-nourished. No distress.  Cardiovascular: Normal rate and regular rhythm.  +DP pulses   Pulmonary/Chest: Effort normal. No respiratory distress.  Musculoskeletal: She exhibits tenderness. She exhibits no deformity.  Left inner thigh/hamstring (in bruised area) ROM of left hip, knee, ankle intact.   Neurological: She is alert and oriented to person, place, and time. No cranial nerve deficit. Coordination normal.  Gait normal  Skin: Capillary refill takes less than 2 seconds.     Psychiatric: She has a normal mood and affect. Her behavior is normal.  Nursing note and vitals reviewed.  Vitals:   06/15/18 1104  BP: 120/86  Pulse: 78  Temp: 98 F (36.7 C)  SpO2: 95%      Assessment & Plan:   Contusion - Bruised area patient's left leg is consistent with story of falling off of chair and being hit by a chair. Patient advised she can use heating pad to help soothe bruise and can elevate leg when seated. She may use Tylenol or Motrin as needed for pain.  Can also do gentle range of motion exercises of legs to ensure good blood  flow. Bruise appears to be resolving - patient advised area will turn a purple-yellow color and the discoloration will slowly fade as bruise heals.  Patient reassured that bruised area is consistent with her fall/being hit with chair and I see no indication of a blood clot in her leg.  Fall from chair --patient has no other injury from fall other than bruising on the left leg.  I do not feel any imaging is required at this time.    She will follow-up in January 2020 for her annual physical.  Advised she can return to clinic at any time if  need arises.

## 2018-06-15 NOTE — Patient Instructions (Signed)
Great to meet you!  You can use heating pad on bruised area for 20 minute intervals to help bruise resolve more quickly & also can elevate leg while sitting.  May use tylenol or motrin as needed for pain

## 2018-06-29 ENCOUNTER — Other Ambulatory Visit (HOSPITAL_COMMUNITY)
Admission: RE | Admit: 2018-06-29 | Discharge: 2018-06-29 | Disposition: A | Discharge status: Home / Self Care | Payer: 59 | Source: Ambulatory Visit | Attending: Internal Medicine | Admitting: Internal Medicine

## 2018-06-29 ENCOUNTER — Encounter: Payer: Self-pay | Admitting: Internal Medicine

## 2018-06-29 ENCOUNTER — Ambulatory Visit: Payer: 59 | Admitting: Internal Medicine

## 2018-06-29 VITALS — BP 126/80 | HR 76 | Temp 97.9°F | Resp 18 | Wt 290.1 lb

## 2018-06-29 DIAGNOSIS — N950 Postmenopausal bleeding: Secondary | ICD-10-CM | POA: Diagnosis not present

## 2018-06-29 DIAGNOSIS — R7303 Prediabetes: Secondary | ICD-10-CM

## 2018-06-29 DIAGNOSIS — N841 Polyp of cervix uteri: Secondary | ICD-10-CM

## 2018-06-29 DIAGNOSIS — Z124 Encounter for screening for malignant neoplasm of cervix: Secondary | ICD-10-CM | POA: Insufficient documentation

## 2018-06-29 LAB — POCT GLYCOSYLATED HEMOGLOBIN (HGB A1C)
HBA1C, POC (CONTROLLED DIABETIC RANGE): 5.6 % (ref 0.0–7.0)
HBA1C, POC (PREDIABETIC RANGE): 5.6 % — AB (ref 5.7–6.4)
HEMOGLOBIN A1C: 5.6 % (ref 4.0–5.6)
HEMOGLOBIN A1C: 5.6 % (ref 4.0–5.6)

## 2018-06-29 NOTE — Progress Notes (Signed)
Subjective:  Patient ID: Amy Petty, female    DOB: 02-11-1956  Age: 62 y.o. MRN: [id number]  CC: The primary encounter diagnosis was Cervical cancer screening. Diagnoses of Prediabetes, Post-menopausal bleeding, and Cervical polyp were also pertinent to this visit.  HPI Amy Petty presents for evaluation of an episode vaginal bleeding that occurred on  Sunday  .  the bleeding was brief and self limiting  but accompanied by symptoms reminiscent of of pre menstrual suprapubic discomfort.  No recent sexual relations , last encounter was months ago  Noticed blood when she wiped,  brownisih red .  Does not take HRT or ASA      Outpatient Medications Prior to Visit  Medication Sig Dispense Refill  . acetaminophen (TYLENOL) 650 MG CR tablet Take 650 mg by mouth every 8 (eight) hours.    Marland Kitchen albuterol (PROVENTIL HFA;VENTOLIN HFA) 108 (90 Base) MCG/ACT inhaler Inhale 2 puffs into the lungs every 6 (six) hours as needed for wheezing or shortness of breath. 1 Inhaler 0  . amLODipine (NORVASC) 2.5 MG tablet Take 1 tablet (2.5 mg total) by mouth daily. 90 tablet 3  . cholecalciferol (VITAMIN D) 1000 units tablet Take 1,000 Units by mouth daily.    . clarithromycin (BIAXIN) 500 MG tablet Take 500 mg by mouth 2 (two) times daily.    . Melatonin 2.5 MG CHEW Chew 1 tablet by mouth at bedtime.    . meloxicam (MOBIC) 15 MG tablet TAKE 1 TABLET (15 MG TOTAL) BY MOUTH DAILY. 30 tablet 2  . metoprolol succinate (TOPROL-XL) 100 MG 24 hr tablet TAKE ONE TABLET BY MOUTH ONE TIME DAILY WITH FOOD OR IMMEDIATELY FOLLOWING A MEAL 90 tablet 1  . Multiple Vitamin (MULTIVITAMIN) tablet Take 1 tablet by mouth daily.      . mupirocin cream (BACTROBAN) 2 % APPLY 1 APPLICATION TOPICALLY 2 (TWO) TIMES DAILY. 15 g 1  . omeprazole (PRILOSEC) 40 MG capsule TAKE 1 CAPSULE EVERY DAY 90 capsule 1  . spironolactone (ALDACTONE) 50 MG tablet TAKE 1 TABLET BY MOUTH EVERY DAY 90 tablet 1  . traMADol (ULTRAM) 50 MG tablet TAKE 1 TABLET  BY MOUTH EVERY 8 HOURS AS NEEDED 90 tablet 1  . triamcinolone (NASACORT ALLERGY 24HR) 55 MCG/ACT AERO nasal inhaler Place 2 sprays into the nose daily.    . chlorpheniramine-HYDROcodone (TUSSIONEX PENNKINETIC ER) 10-8 MG/5ML SUER Take 10 mLs by mouth at bedtime as needed for cough. (Patient not taking: Reported on 06/29/2018) 140 mL 0  . phentermine (ADIPEX-P) 37.5 MG tablet Take 1 tablet (37.5 mg total) by mouth daily before breakfast. (Patient not taking: Reported on 06/29/2018) 30 tablet 2  . benzonatate (TESSALON) 200 MG capsule Take 1 capsule (200 mg total) by mouth 3 (three) times daily as needed for cough. (Patient not taking: Reported on 06/29/2018) 60 capsule 1  . guaiFENesin-codeine 100-10 MG/5ML syrup TAKE 5 TO 10 MLS BY MOUTH EVERY 4 TO 6 HOURS AS NEEDED FOR COUGH     No facility-administered medications prior to visit.     Review of Systems;  Patient denies headache, fevers, malaise, unintentional weight loss, skin rash, eye pain, sinus congestion and sinus pain, sore throat, dysphagia,  hemoptysis , cough, dyspnea, wheezing, chest pain, palpitations, orthopnea, edema, abdominal pain, nausea, melena, diarrhea, constipation, flank pain, dysuria, hematuria, urinary  Frequency, nocturia, numbness, tingling, seizures,  Focal weakness, Loss of consciousness,  Tremor, insomnia, depression, anxiety, and suicidal ideation.      Objective:  BP 126/80 (BP  Location: Left Arm, Patient Position: Sitting, Cuff Size: Large)   Pulse 76   Temp 97.9 F (36.6 C) (Oral)   Resp 18   Wt 290 lb 2 oz (131.6 kg)   LMP 11/23/2013   SpO2 97%   BMI 44.77 kg/m   BP Readings from Last 3 Encounters:  06/29/18 126/80  06/15/18 120/86  02/12/18 116/68    Wt Readings from Last 3 Encounters:  06/29/18 290 lb 2 oz (131.6 kg)  06/15/18 289 lb 6.4 oz (131.3 kg)  02/12/18 285 lb 6.4 oz (129.5 kg)    General Appearance:    Alert, cooperative, no distress, appears stated age  Head:    Normocephalic,  without obvious abnormality, atraumatic  Eyes:    PERRL, conjunctiva/corneas clear, EOM's intact, fundi    benign, both eyes  Ears:    Normal TM's and external ear canals, both ears  Nose:   Nares normal, septum midline, mucosa normal, no drainage    or sinus tenderness  Throat:   Lips, mucosa, and tongue normal; teeth and gums normal  Neck:   Supple, symmetrical, trachea midline, no adenopathy;    thyroid:  no enlargement/tenderness/nodules; no carotid   bruit or JVD  Back:     Symmetric, no curvature, ROM normal, no CVA tenderness  Lungs:     Clear to auscultation bilaterally, respirations unlabored  Chest Wall:    No tenderness or deformity   Heart:    Regular rate and rhythm, S1 and S2 normal, no murmur, rub   or gallop     Abdomen:     Soft, non-tender, bowel sounds active all four quadrants,    no masses, no organomegaly  Genitalia:    Pelvic: cervix notable for friable polyp protruding from os which bled easily upon contact.  external genitalia normal, no adnexal masses or tenderness, no cervical motion tenderness, rectovaginal septum normal, uterus normal size, shape, and consistency and vagina normal without discharge  Extremities:   Extremities normal, atraumatic, no cyanosis or edema  Pulses:   2+ and symmetric all extremities  Skin:   Skin color, texture, turgor normal, no rashes or lesions  Lymph nodes:   Cervical, supraclavicular, and axillary nodes normal  Neurologic:   CNII-XII intact, normal strength, sensation and reflexes    throughout    Lab Results  Component Value Date   HGBA1C 5.6 06/29/2018   HGBA1C 5.6 06/29/2018   HGBA1C 5.6 (A) 06/29/2018   HGBA1C 5.6 06/29/2018    Lab Results  Component Value Date   CREATININE 0.70 11/16/2017   CREATININE 0.63 10/01/2016   CREATININE 0.64 10/29/2015    Lab Results  Component Value Date   WBC 9.1 10/01/2016   HGB 13.5 10/01/2016   HCT 41.1 10/01/2016   PLT 285.0 10/01/2016   GLUCOSE 104 (H) 11/16/2017   CHOL  233 (H) 11/16/2017   TRIG 104.0 11/16/2017   HDL 67.40 11/16/2017   LDLCALC 145 (H) 11/16/2017   ALT 32 11/16/2017   AST 23 11/16/2017   NA 136 11/16/2017   K 4.5 11/16/2017   CL 98 11/16/2017   CREATININE 0.70 11/16/2017   BUN 25 (H) 11/16/2017   CO2 25 11/16/2017   TSH 1.09 11/16/2017   HGBA1C 5.6 06/29/2018   HGBA1C 5.6 06/29/2018   HGBA1C 5.6 (A) 06/29/2018   HGBA1C 5.6 06/29/2018   MICROALBUR <0.7 10/01/2016    Mm Screening Breast Tomo Bilateral  Result Date: 11/11/2017 CLINICAL DATA:  Screening. EXAM: DIGITAL SCREENING BILATERAL MAMMOGRAM WITH  3D TOMO WITH CAD COMPARISON:  Previous exam(s). ACR Breast Density Category b: There are scattered areas of fibroglandular density. FINDINGS: There are no findings suspicious for malignancy. Images were processed with CAD. IMPRESSION: No mammographic evidence of malignancy. A result letter of this screening mammogram will be mailed directly to the patient. RECOMMENDATION: Screening mammogram in one year. (Code:SM-B-01Y) BI-RADS CATEGORY  1: Negative. Electronically Signed   By: Fidela Salisbury M.D.   On: 11/11/2017 12:58    Assessment & Plan:   Problem List Items Addressed This Visit    Post-menopausal bleeding    Likely due to cervical polyp noted on exam today. FH of endometrial CA in sister.  Referral to [name] for further evaluation.  PAP smear done       Relevant Orders   Ambulatory referral to Gynecology   Prediabetes    Her  random glucose is not  elevated but her previous A1c suggested she is at risk for developing diabetes.  I commend her for following a low glycemic index diet and recommended regular participation y in an aerobic  exercise activity.  We should check an A1c in 6 months.        Relevant Orders   POCT HgB A1C (Completed)    Other Visit Diagnoses    Cervical cancer screening    -  Primary   Relevant Orders   Cytology - PAP   Cervical polyp       Relevant Orders   Ambulatory referral to Gynecology        I have discontinued Audry Riles. Kaylor's guaiFENesin-codeine and benzonatate. I am also having her maintain her multivitamin, Melatonin, acetaminophen, mupirocin cream, spironolactone, meloxicam, phentermine, amLODipine, omeprazole, albuterol, chlorpheniramine-HYDROcodone, traMADol, metoprolol succinate, cholecalciferol, clarithromycin, and triamcinolone.  No orders of the defined types were placed in this encounter.   Medications Discontinued During This Encounter  Medication Reason  . benzonatate (TESSALON) 200 MG capsule Completed Course  . guaiFENesin-codeine 100-10 MG/5ML syrup Completed Course    Follow-up: Return in about 6 months (around 12/30/2018) for prediabetes.   Crecencio Mc, MD

## 2018-06-29 NOTE — Patient Instructions (Addendum)
I have made the referral to [place] at [name] location  Your PAP smear should be resulted in 3 or 4 days   You have prediabetes, based on January's A1c:  The A1c( "glycosylated hemoglobin")  is a  blood test that can be used to monitor a person's control of their diabetes, but it can also be used to to find people who are at risk for developing diabetes,  because it is directly proportional to blood sugars and is much more stable than a fasting blood sugar (which is really only a snapshot) .because it corresponds to the last 3 months of serum glucose readings  A normal a1c is from 4.8 to 5.7  And corresponds to fasting sugars < 100 and post prandial or random sugars < 130.  An a1c from 5.8 to 6.4 corresponds to sugars in the "above normal but below diabetes" range (fasting sugars >90 but < 125,  And random sugars < 200.)  We have been monitoring yours  And your last one rose to 6.3 in January , so we are repatig it today .  Getting back into a regular exercise program ( 30 minutes of cardio 5 days per week is a good goal)  Will increase your body's sensitivity to the insulin it is already producing ,  Thus lowering your blood sugars and delaying the progression to diabetes,  And will lower your cholesterol as well.  I recommend rechecking it in 6 months and scheduling a visit with me at that point (earlier if you prefer)

## 2018-06-30 DIAGNOSIS — N950 Postmenopausal bleeding: Secondary | ICD-10-CM | POA: Insufficient documentation

## 2018-06-30 DIAGNOSIS — R7303 Prediabetes: Secondary | ICD-10-CM | POA: Insufficient documentation

## 2018-06-30 NOTE — Assessment & Plan Note (Signed)
Likely due to cervical polyp noted on exam today. FH of endometrial CA in sister.  Referral to [facility] for further evaluation.  PAP smear done

## 2018-06-30 NOTE — Assessment & Plan Note (Signed)
Her  random glucose is not  elevated but her previous A1c suggested she is at risk for developing diabetes.  I commend her for following a low glycemic index diet and recommended regular participation y in an aerobic  exercise activity.  We should check an A1c in 6 months.

## 2018-07-02 LAB — CYTOLOGY - PAP
Diagnosis: NEGATIVE
HPV: NOT DETECTED

## 2018-07-14 ENCOUNTER — Telehealth: Payer: Self-pay

## 2018-07-14 NOTE — Telephone Encounter (Signed)
Mychart message has been sent to inform patient that we do Have Flu shots and she needs to schedule nurse visit to receive one outside of a doctors appointment.

## 2018-07-15 ENCOUNTER — Other Ambulatory Visit: Payer: Self-pay | Admitting: Internal Medicine

## 2018-07-21 ENCOUNTER — Ambulatory Visit: Payer: 59 | Admitting: Family

## 2018-07-21 ENCOUNTER — Encounter: Payer: Self-pay | Admitting: Family

## 2018-07-21 ENCOUNTER — Ambulatory Visit (INDEPENDENT_AMBULATORY_CARE_PROVIDER_SITE_OTHER): Payer: 59

## 2018-07-21 VITALS — BP 120/78 | HR 82 | Temp 98.2°F | Resp 16 | Wt 289.2 lb

## 2018-07-21 DIAGNOSIS — M25562 Pain in left knee: Secondary | ICD-10-CM

## 2018-07-21 DIAGNOSIS — Z23 Encounter for immunization: Secondary | ICD-10-CM | POA: Diagnosis not present

## 2018-07-21 DIAGNOSIS — G8929 Other chronic pain: Secondary | ICD-10-CM | POA: Diagnosis not present

## 2018-07-21 DIAGNOSIS — M1712 Unilateral primary osteoarthritis, left knee: Secondary | ICD-10-CM | POA: Diagnosis not present

## 2018-07-21 MED ORDER — MELOXICAM 7.5 MG PO TABS: 7.5000 mg | ORAL_TABLET | Freq: Every day | ORAL | 1 refills | Status: DC

## 2018-07-21 NOTE — Progress Notes (Signed)
Subjective:    Patient ID: Amy Petty, female    DOB: [birth year], 62 y.o.   MRN: [id number]  CC: Amy Petty is a 62 y.o. female who presents today for an acute visit.    HPI: Left medial knee pain x 4 weeks, worsening.  Painful to bend. Feels swelling left medial side of knee.  No falls. Pain with steps. NO giving way. Feels a popping sensation. Had a steroid shot in knee  (unsure which one) years ago.  Chronic low back pain. Takes tramadol, tyelonol, ibuprofen which also helps knee. Hasnt been taking mobic since on tramadol. No h/o GIB.  No sob, le swelling, immobilization, recent surgery, or malignancy.     HISTORY:  Past Medical History:  Diagnosis Date  . Anemia    iron deficiency, due to fibroids  . Diastolic dysfunction    Grade 1 per echo Oct 2012; EF is normal.  . Fibroid, uterus    with menorrhagia  . Hypertension   . Hypothyroidism   . Obesity   . PVC's (premature ventricular contractions)   . SVT (supraventricular tachycardia) (HCC)    Past Surgical History:  Procedure Laterality Date  . CARPAL TUNNEL RELEASE    . CARPAL TUNNEL RELEASE  2008   right hand. wears  brace on left  . TONSILLECTOMY AND ADENOIDECTOMY    . TUBAL LIGATION     Family History  Problem Relation Age of Onset  . Cancer Mother        Lung Ca second hand smoke  . Lung cancer Mother   . Breast cancer Mother 59  . Cancer Father        liver and prostate Ca  . Liver cancer Father   . Diabetes Sister     Allergies: Gabapentin; Penicillins; and Sulfonamide derivatives Current Outpatient Medications on File Prior to Visit  Medication Sig Dispense Refill  . acetaminophen (TYLENOL) 650 MG CR tablet Take 650 mg by mouth every 8 (eight) hours.    Marland Kitchen albuterol (PROVENTIL HFA;VENTOLIN HFA) 108 (90 Base) MCG/ACT inhaler Inhale 2 puffs into the lungs every 6 (six) hours as needed for wheezing or shortness of breath. 1 Inhaler 0  . amLODipine (NORVASC) 2.5 MG tablet Take 1 tablet (2.5 mg  total) by mouth daily. 90 tablet 3  . chlorpheniramine-HYDROcodone (TUSSIONEX PENNKINETIC ER) 10-8 MG/5ML SUER Take 10 mLs by mouth at bedtime as needed for cough. 140 mL 0  . cholecalciferol (VITAMIN D) 1000 units tablet Take 1,000 Units by mouth daily.    . clarithromycin (BIAXIN) 500 MG tablet Take 500 mg by mouth 2 (two) times daily.    . Melatonin 2.5 MG CHEW Chew 1 tablet by mouth at bedtime.    . metoprolol succinate (TOPROL-XL) 100 MG 24 hr tablet TAKE ONE TABLET BY MOUTH ONE TIME DAILY WITH FOOD OR IMMEDIATELY FOLLOWING A MEAL 90 tablet 1  . Multiple Vitamin (MULTIVITAMIN) tablet Take 1 tablet by mouth daily.      . mupirocin cream (BACTROBAN) 2 % APPLY 1 APPLICATION TOPICALLY 2 (TWO) TIMES DAILY. 15 g 1  . omeprazole (PRILOSEC) 40 MG capsule TAKE 1 CAPSULE BY MOUTH EVERY DAY 90 capsule 1  . phentermine (ADIPEX-P) 37.5 MG tablet Take 1 tablet (37.5 mg total) by mouth daily before breakfast. 30 tablet 2  . spironolactone (ALDACTONE) 50 MG tablet TAKE 1 TABLET BY MOUTH EVERY DAY 90 tablet 1  . traMADol (ULTRAM) 50 MG tablet TAKE 1 TABLET BY MOUTH EVERY 8 HOURS  AS NEEDED 90 tablet 1  . triamcinolone (NASACORT ALLERGY 24HR) 55 MCG/ACT AERO nasal inhaler Place 2 sprays into the nose daily.     No current facility-administered medications on file prior to visit.     Social History   Tobacco Use  . Smoking status: Never Smoker  . Smokeless tobacco: Never Used  Substance Use Topics  . Alcohol use: No  . Drug use: No    Review of Systems  Constitutional: Negative for chills and fever.  Respiratory: Negative for cough.   Cardiovascular: Negative for chest pain and palpitations.  Gastrointestinal: Negative for nausea and vomiting.  Musculoskeletal: Positive for arthralgias (left knee).  Neurological: Negative for numbness.      Objective:    BP 120/78 (BP Location: Left Arm, Patient Position: Sitting, Cuff Size: Large)   Pulse 82   Temp 98.2 F (36.8 C) (Oral)   Resp 16   Wt  289 lb 4 oz (131.2 kg)   LMP 11/23/2013   SpO2 96%   BMI 44.63 kg/m    Physical Exam  Constitutional: She appears well-developed and well-nourished.  Eyes: Conjunctivae are normal.  Cardiovascular: Normal rate, regular rhythm, normal heart sounds and normal pulses.  No LE edema, palpable cords or masses. No erythema or increased warmth. No asymmetry in calf size when compared bilaterally LE hair growth symmetric and present. No discoloration of varicosities noted. LE warm and palpable pedal pulses.   Pulmonary/Chest: Effort normal and breath sounds normal. She has no wheezes. She has no rhonchi. She has no rales.  Musculoskeletal:       Left knee: She exhibits normal range of motion, no swelling, no effusion and no erythema. Tenderness found. Medial joint line tenderness noted.  Bilateral knees are symmetric. No effusion appreciated.  No increase in warmth or erythema. Crepitus felt with flexion of bilateral knees.  Left knee:  Able to extend to -5 to 10 degrees and flex to 110 degrees with minimal discomfort. No catching with McMurray maneuver. No patellar apprehension. No calf tenderness of lower leg edema bilaterally.    Neurological: She is alert.  Skin: Skin is warm and dry.  Psychiatric: She has a normal mood and affect. Her speech is normal and behavior is normal. Thought content normal.  Vitals reviewed.      Assessment & Plan:   1. Chronic pain of left knee Suspect multifactorial.  Differential includes degenerative changes, meniscal etiology.  Patient declined orthopedic consult today.  She is comfortable with trial of conservative therapy.  We will start meloxicam and icing regimen at home.  Patient will let us know if not better. - DG Knee Complete 4 Views Left - meloxicam (MOBIC) 7.5 MG tablet; Take 1 tablet (7.5 mg total) by mouth daily.  Dispense: 30 tablet; Refill: 1  2. Need for immunization against influenza - Flu Vaccine QUAD 36+ mos IM    I have  discontinued Amy Petty. Amy Petty's meloxicam. I am also having her start on meloxicam. Additionally, I am having her maintain her multivitamin, Melatonin, acetaminophen, mupirocin cream, spironolactone, phentermine, amLODipine, albuterol, chlorpheniramine-HYDROcodone, traMADol, metoprolol succinate, cholecalciferol, clarithromycin, triamcinolone, and omeprazole.   Meds ordered this encounter  Medications  . meloxicam (MOBIC) 7.5 MG tablet    Sig: Take 1 tablet (7.5 mg total) by mouth daily.    Dispense:  30 tablet    Refill:  1    Order Specific Question:   Supervising Provider    Answer:   Deborra Medina L [[id number]]  Return precautions given.   Risks, benefits, and alternatives of the medications and treatment plan prescribed today were discussed, and patient expressed understanding.   Education regarding symptom management and diagnosis given to patient on AVS.  Continue to follow with Crecencio Mc, MD for routine health maintenance.   Jenna Luo and I agreed with plan.   Mable Paris, FNP

## 2018-07-21 NOTE — Patient Instructions (Addendum)
STOP aleve, ibuprofen since Mobic is similar.   Mobic is an antiinflammatory. Do not take with other anti inflammatories as discussed.   Mobic - take with food.   Icing 2-3 /day for 20 minutes and particularly excericse.   Ace wrap or Neoprene sleeve.   Knee Pain, Adult Many things can cause knee pain. The pain often goes away on its own with time and rest. If the pain does not go away, tests may be done to find out what is causing the pain. Follow these instructions at home: Activity  Rest your knee.  Do not do things that cause pain.  Avoid activities where both feet leave the ground at the same time (high-impact activities). Examples are running, jumping rope, and doing jumping jacks. General instructions  Take medicines only as told by your doctor.  Raise (elevate) your knee when you are resting. Make sure your knee is higher than your heart.  Sleep with a pillow under your knee.  If told, put ice on the knee: ? Put ice in a plastic bag. ? Place a towel between your skin and the bag. ? Leave the ice on for 20 minutes, 2-3 times a day.  Ask your doctor if you should wear an elastic knee support.  Lose weight if you are overweight. Being overweight can make your knee hurt more.  Do not use any tobacco products. These include cigarettes, chewing tobacco, or electronic cigarettes. If you need help quitting, ask your doctor. Smoking may slow down healing. Contact a doctor if:  The pain does not stop.  The pain changes or gets worse.  You have a fever along with knee pain.  Your knee gives out or locks up.  Your knee swells, and becomes worse. Get help right away if:  Your knee feels warm.  You cannot move your knee.  You have very bad knee pain.  You have chest pain.  You have trouble breathing. Summary  Many things can cause knee pain. The pain often goes away on its own with time and rest.  Avoid activities that put stress on your knee. These include  running and jumping rope.  Get help right away if you cannot move your knee, or if your knee feels warm, or if you have trouble breathing. This information is not intended to replace advice given to you by your health care provider. Make sure you discuss any questions you have with your health care provider. Document Released: 01/23/2009 Document Revised: 10/21/2016 Document Reviewed: 10/21/2016 Elsevier Interactive Patient Education  2017 [employer].

## 2018-07-27 ENCOUNTER — Encounter (INDEPENDENT_AMBULATORY_CARE_PROVIDER_SITE_OTHER): Payer: Self-pay | Admitting: Family Medicine

## 2018-07-27 ENCOUNTER — Ambulatory Visit (INDEPENDENT_AMBULATORY_CARE_PROVIDER_SITE_OTHER): Payer: 59 | Admitting: Family Medicine

## 2018-07-27 DIAGNOSIS — M25562 Pain in left knee: Secondary | ICD-10-CM

## 2018-07-27 NOTE — Progress Notes (Signed)
I saw and examined patient with Dr. Okey Dupre and agree with assessment and plan as outlined.  Injection given today, referral to PT.  MRI if pain persists.

## 2018-07-27 NOTE — Progress Notes (Signed)
  Amy Petty - 62 y.o. female MRN [id number]  Date of birth: Jul 10, 1956    SUBJECTIVE:      Chief Complaint:/ HPI:  61 year old female presents with about 3 weeks of left knee pain.  Patient denies any specific mechanism of injury.  Her pain is difficult for her to localize it feels deep in the knee.  Her pain is sharp and constant but waxes and wanes in terms of severity.  It can be 6-10/10.  It is worse at the end of the day and tends to be activity related.  Is also worse with going up stairs or knee flexion.  She gets some relief with rest.  She denies any radiation of her pain.  She denies any joint swelling, erythema, bruising.  No numbness or tingling associated.  She reports popping/clicking but no locking or giving out.  She has tried meloxicam 7.5 and 15 mg as well as naproxen 500 mg none of which have provided relief.   ROS:     See HPI  PERTINENT  PMH / PSH FH / / SH:  Past Medical, Surgical, Social, and Family History Reviewed & Updated in the EMR.  Pertinent findings include:  SVT, obesity  OBJECTIVE: LMP 11/23/2013   Physical Exam:  Vital signs are reviewed.  GEN: Alert and oriented, NAD Pulm: Breathing unlabored PSY: normal mood, congruent affect  MSK: Left knee: - Inspection: no gross deformity. No swelling/effusion, erythema or bruising. Skin intact - Palpation: Tenderness along the medial and lateral joint line.  There is also tenderness over the posterior lateral aspect of the joint.  There is no tenderness over the fibular head.  There is mild tenderness over the past anserine bursa bilaterally. - ROM: full active ROM with flexion and extension in knee.  Pain increased with knee flexion - Strength: 5/5 strength - Neuro/vasc: NV intact - Special Tests: - LIGAMENTS: negative anterior and posterior drawer, negative Lachman's, no MCL or LCL laxity  -- MENISCUS: Pain with McMurray's, patient unable to perform Thessaly's due to pain -- PF JOINT: nml patellar mobility  bilaterally. negative patellar apprehension  Right Knee: No obvious deformity, swelling, erythema Mild tenderness over the peds anserine bursa Full range of motion 5/5 strength N/V intact  Hips: normal ROM, negative FABER and FADIR bilaterally   ASSESSMENT & PLAN:  1.  Left knee pain-suspect this is due to underlying meniscal pathology.  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  X-rays previously obtained by PCP were independently reviewed today.  Overall there is no significant degenerative changes in the medial or lateral compartments.  There is mild spurring noted at the superior aspect of the patella. - Intra-articular steroid injection performed today as detailed below - will refer to physical therapy - Plan outpatient follow-up in 6 weeks.  She will contact the clinic sooner if she got no relief from the steroid injection in which case we will order MRI.    Procedure performed: knee intraarticular corticosteroid injection; palpation guided  Consent obtained and verified. Time-out conducted. Noted no overlying erythema, induration, or other signs of local infection. The  lateral patellofemoral joint space was palpated and marked. The overlying skin was prepped in a sterile fashion. Topical analgesic spray: Ethyl chloride. Joint: Left knee Needle: 25-gauge 1.5 inch Completed without difficulty. Meds: Depo-Medrol 40 mg, lidocaine 3 mL  Advised to call if fevers/chills, erythema, induration, drainage, or persistent bleeding.

## 2018-07-29 ENCOUNTER — Telehealth (INDEPENDENT_AMBULATORY_CARE_PROVIDER_SITE_OTHER): Payer: Self-pay | Admitting: Family Medicine

## 2018-07-29 DIAGNOSIS — S8992XA Unspecified injury of left lower leg, initial encounter: Secondary | ICD-10-CM | POA: Diagnosis not present

## 2018-07-29 DIAGNOSIS — M1712 Unilateral primary osteoarthritis, left knee: Secondary | ICD-10-CM | POA: Diagnosis not present

## 2018-07-29 DIAGNOSIS — M25562 Pain in left knee: Secondary | ICD-10-CM | POA: Diagnosis not present

## 2018-07-29 NOTE — Telephone Encounter (Signed)
It can take a few days for injection to help, but I went ahead and ordered an MRI.  Keep using ice, also tramadol and naproxen as needed.

## 2018-07-29 NOTE — Telephone Encounter (Signed)
Please advise 

## 2018-07-29 NOTE — Telephone Encounter (Signed)
It would be ok to start PT.  They may be able to do some treatments to help the pain.

## 2018-07-29 NOTE — Telephone Encounter (Signed)
Patient is having excruciating pain in the back of her left knee. She had an injection on 9/17. It has been giving away on her when ever she experiences the sharp pains. She said she is being very careful with it right now as far as walking. Please advise what patient can do? She iced it last night and said it was a little better this morning.    # [phone]

## 2018-07-29 NOTE — Telephone Encounter (Signed)
Patient advised.

## 2018-07-29 NOTE — Telephone Encounter (Signed)
Patient advised.  She has her first appointment at [facility] on Wednesday of next week.  Should she put this on hold until we know the results of the MRI?  (She told me to leave a message on her voice mail if she doesn't answer).

## 2018-08-04 DIAGNOSIS — M25562 Pain in left knee: Secondary | ICD-10-CM | POA: Diagnosis not present

## 2018-08-11 DIAGNOSIS — M25562 Pain in left knee: Secondary | ICD-10-CM | POA: Diagnosis not present

## 2018-08-13 DIAGNOSIS — N950 Postmenopausal bleeding: Secondary | ICD-10-CM | POA: Diagnosis not present

## 2018-08-13 DIAGNOSIS — N840 Polyp of corpus uteri: Secondary | ICD-10-CM | POA: Diagnosis not present

## 2018-08-13 DIAGNOSIS — Z6841 Body Mass Index (BMI) 40.0 and over, adult: Secondary | ICD-10-CM | POA: Diagnosis not present

## 2018-08-13 DIAGNOSIS — N841 Polyp of cervix uteri: Secondary | ICD-10-CM | POA: Diagnosis not present

## 2018-08-17 DIAGNOSIS — M25562 Pain in left knee: Secondary | ICD-10-CM | POA: Diagnosis not present

## 2018-08-22 ENCOUNTER — Other Ambulatory Visit: Payer: Self-pay | Admitting: Internal Medicine

## 2018-08-23 MED ORDER — SPIRONOLACTONE 50 MG PO TABS: 50.0000 mg | ORAL_TABLET | Freq: Every day | ORAL | 1 refills | Status: DC

## 2018-08-25 DIAGNOSIS — M25562 Pain in left knee: Secondary | ICD-10-CM | POA: Diagnosis not present

## 2018-10-06 DIAGNOSIS — M1712 Unilateral primary osteoarthritis, left knee: Secondary | ICD-10-CM | POA: Diagnosis not present

## 2018-10-28 ENCOUNTER — Other Ambulatory Visit: Payer: Self-pay | Admitting: Internal Medicine

## 2018-10-29 ENCOUNTER — Telehealth: Payer: Self-pay | Admitting: Internal Medicine

## 2018-10-29 NOTE — Telephone Encounter (Signed)
I have completed the PA on covermymeds.

## 2018-10-29 NOTE — Telephone Encounter (Signed)
Copied from [place] [id number]. Topic: Quick Communication - See Telephone Encounter >> Oct 29, 2018 12:20 PM Bea Graff, NT wrote: CRM for notification. See Telephone encounter for: 10/29/18. Ena Dawley with [facility] states that the traMADol (ULTRAM) 50 MG tablet will require PA.

## 2018-10-29 NOTE — Telephone Encounter (Signed)
Refilled: 04/16/2018 Last OV: 06/29/2018 Next OV: 11/24/2018

## 2018-11-02 ENCOUNTER — Other Ambulatory Visit: Payer: Self-pay | Admitting: Internal Medicine

## 2018-11-15 DIAGNOSIS — M7050 Other bursitis of knee, unspecified knee: Secondary | ICD-10-CM | POA: Diagnosis not present

## 2018-11-18 ENCOUNTER — Telehealth: Payer: Self-pay

## 2018-11-18 DIAGNOSIS — M545 Low back pain: Secondary | ICD-10-CM

## 2018-11-18 NOTE — Telephone Encounter (Signed)
error 

## 2018-11-18 NOTE — Telephone Encounter (Signed)
PA was approved through 12/17/2018.

## 2018-11-24 ENCOUNTER — Ambulatory Visit (INDEPENDENT_AMBULATORY_CARE_PROVIDER_SITE_OTHER): Payer: 59 | Admitting: Internal Medicine

## 2018-11-24 ENCOUNTER — Encounter: Payer: Self-pay | Admitting: Internal Medicine

## 2018-11-24 VITALS — BP 122/84 | HR 89 | Temp 98.0°F | Resp 15 | Ht 67.5 in | Wt 284.6 lb

## 2018-11-24 DIAGNOSIS — Z1239 Encounter for other screening for malignant neoplasm of breast: Secondary | ICD-10-CM | POA: Diagnosis not present

## 2018-11-24 DIAGNOSIS — I10 Essential (primary) hypertension: Secondary | ICD-10-CM | POA: Diagnosis not present

## 2018-11-24 DIAGNOSIS — I471 Supraventricular tachycardia: Secondary | ICD-10-CM

## 2018-11-24 DIAGNOSIS — Z0001 Encounter for general adult medical examination with abnormal findings: Secondary | ICD-10-CM

## 2018-11-24 DIAGNOSIS — G629 Polyneuropathy, unspecified: Secondary | ICD-10-CM

## 2018-11-24 DIAGNOSIS — E785 Hyperlipidemia, unspecified: Secondary | ICD-10-CM

## 2018-11-24 DIAGNOSIS — R7303 Prediabetes: Secondary | ICD-10-CM | POA: Diagnosis not present

## 2018-11-24 DIAGNOSIS — Z1211 Encounter for screening for malignant neoplasm of colon: Secondary | ICD-10-CM

## 2018-11-24 MED ORDER — ZOSTER VAC RECOMB ADJUVANTED 50 MCG/0.5ML IM SUSR: 0.5000 mL | Freq: Once | INTRAMUSCULAR | 1 refills | Status: AC

## 2018-11-24 MED ORDER — TRAMADOL HCL 50 MG PO TABS: 50.0000 mg | ORAL_TABLET | Freq: Three times a day (TID) | ORAL | 0 refills | Status: DC | PRN

## 2018-11-24 NOTE — Patient Instructions (Addendum)
Ok to continue using tylenol and either motrin or aleve on a regular basis for pain contol  You can take up to 2000 mg of acetominophen (tylenol) every day safely  In divided doses (500 mg every 6 hours  Or 1000 mg every 12 hours.)  Save the tramadol for severe pain.  I have printed you a prescription for future use with the GoodRx card  Your annual mammogram has been ordered.  You are encouraged (required) to call to make your appointment at [facility]  Referral for colonoscopy to  [facility] in progress  For your colonoscopy   The ShingRx vaccine is available in local pharmacies and is much more protective than the old one  Zostavax  (it is about 97%  Effective in preventing shingles). .   It is therefore ADVISED for all interested adults over 50 to prevent shingles so I have printed you a prescription for it.  (it requires a 2nd dose 2 too 6 months after the first one) .  It will cause you to have flu  like symptoms for 2 days    Health Maintenance for Postmenopausal Women Menopause is a normal process in which your reproductive ability comes to an end. This process happens gradually over a span of months to years, usually between the ages of 62 and 62. Menopause is complete when you have missed 12 consecutive menstrual periods. It is important to talk with your health care provider about some of the most common conditions that affect postmenopausal women, such as heart disease, cancer, and bone loss (osteoporosis). Adopting a healthy lifestyle and getting preventive care can help to promote your health and wellness. Those actions can also lower your chances of developing some of these common conditions. What should I know about menopause? During menopause, you may experience a number of symptoms, such as:  Moderate-to-severe hot flashes.  Night sweats.  Decrease in sex drive.  Mood swings.  Headaches.  Tiredness.  Irritability.  Memory  problems.  Insomnia. Choosing to treat or not to treat menopausal changes is an individual decision that you make with your health care provider. What should I know about hormone replacement therapy and supplements? Hormone therapy products are effective for treating symptoms that are associated with menopause, such as hot flashes and night sweats. Hormone replacement carries certain risks, especially as you become older. If you are thinking about using estrogen or estrogen with progestin treatments, discuss the benefits and risks with your health care provider. What should I know about heart disease and stroke? Heart disease, heart attack, and stroke become more likely as you age. This may be due, in part, to the hormonal changes that your body experiences during menopause. These can affect how your body processes dietary fats, triglycerides, and cholesterol. Heart attack and stroke are both medical emergencies. There are many things that you can do to help prevent heart disease and stroke:  Have your blood pressure checked at least every 1-2 years. High blood pressure causes heart disease and increases the risk of stroke.  If you are 62-62 years old, ask your health care provider if you should take aspirin to prevent a heart attack or a stroke.  Do not use any tobacco products, including cigarettes, chewing tobacco, or electronic cigarettes. If you need help quitting, ask your health care provider.  It is important to eat a healthy diet and maintain a healthy weight. ? Be sure to include plenty of vegetables, fruits, low-fat dairy products, and  lean protein. ? Avoid eating foods that are high in solid fats, added sugars, or salt (sodium).  Get regular exercise. This is one of the most important things that you can do for your health. ? Try to exercise for at least 150 minutes each week. The type of exercise that you do should increase your heart rate and make you sweat. This is known as  moderate-intensity exercise. ? Try to do strengthening exercises at least twice each week. Do these in addition to the moderate-intensity exercise.  Know your numbers.Ask your health care provider to check your cholesterol and your blood glucose. Continue to have your blood tested as directed by your health care provider.  What should I know about cancer screening? There are several types of cancer. Take the following steps to reduce your risk and to catch any cancer development as early as possible. Breast Cancer  Practice breast self-awareness. ? This means understanding how your breasts normally appear and feel. ? It also means doing regular breast self-exams. Let your health care provider know about any changes, no matter how small.  If you are 62 or older, have a clinician do a breast exam (clinical breast exam or CBE) every year. Depending on your age, family history, and medical history, it may be recommended that you also have a yearly breast X-ray (mammogram).  If you have a family history of breast cancer, talk with your health care provider about genetic screening.  If you are at high risk for breast cancer, talk with your health care provider about having an MRI and a mammogram every year.  Breast cancer (BRCA) gene test is recommended for women who have family members with BRCA-related cancers. Results of the assessment will determine the need for genetic counseling and BRCA1 and for BRCA2 testing. BRCA-related cancers include these types: ? Breast. This occurs in males or females. ? Ovarian. ? Tubal. This may also be called fallopian tube cancer. ? Cancer of the abdominal or pelvic lining (peritoneal cancer). ? Prostate. ? Pancreatic. Cervical, Uterine, and Ovarian Cancer Your health care provider may recommend that you be screened regularly for cancer of the pelvic organs. These include your ovaries, uterus, and vagina. This screening involves a pelvic exam, which includes  checking for microscopic changes to the surface of your cervix (Pap test).  For women ages 62-62, health care providers may recommend a pelvic exam and a Pap test every three years. For women ages 62-62, they may recommend the Pap test and pelvic exam, combined with testing for human papilloma virus (HPV), every five years. Some types of HPV increase your risk of cervical cancer. Testing for HPV may also be done on women of any age who have unclear Pap test results.  Other health care providers may not recommend any screening for nonpregnant women who are considered low risk for pelvic cancer and have no symptoms. Ask your health care provider if a screening pelvic exam is right for you.  If you have had past treatment for cervical cancer or a condition that could lead to cancer, you need Pap tests and screening for cancer for at least 20 years after your treatment. If Pap tests have been discontinued for you, your risk factors (such as having a new sexual partner) need to be reassessed to determine if you should start having screenings again. Some women have medical problems that increase the chance of getting cervical cancer. In these cases, your health care provider may recommend that you have screening  and Pap tests more often.  If you have a family history of uterine cancer or ovarian cancer, talk with your health care provider about genetic screening.  If you have vaginal bleeding after reaching menopause, tell your health care provider.  There are currently no reliable tests available to screen for ovarian cancer. Lung Cancer Lung cancer screening is recommended for adults 38-50 years old who are at high risk for lung cancer because of a history of smoking. A yearly low-dose CT scan of the lungs is recommended if you:  Currently smoke.  Have a history of at least 30 pack-years of smoking and you currently smoke or have quit within the past 15 years. A pack-year is smoking an average of one  pack of cigarettes per day for one year. Yearly screening should:  Continue until it has been 15 years since you quit.  Stop if you develop a health problem that would prevent you from having lung cancer treatment. Colorectal Cancer  This type of cancer can be detected and can often be prevented.  Routine colorectal cancer screening usually begins at age 2 and continues through age 61.  If you have risk factors for colon cancer, your health care provider may recommend that you be screened at an earlier age.  If you have a family history of colorectal cancer, talk with your health care provider about genetic screening.  Your health care provider may also recommend using home test kits to check for hidden blood in your stool.  A small camera at the end of a tube can be used to examine your colon directly (sigmoidoscopy or colonoscopy). This is done to check for the earliest forms of colorectal cancer.  Direct examination of the colon should be repeated every 5-10 years until age 79. However, if early forms of precancerous polyps or small growths are found or if you have a family history or genetic risk for colorectal cancer, you may need to be screened more often. Skin Cancer  Check your skin from head to toe regularly.  Monitor any moles. Be sure to tell your health care provider: ? About any new moles or changes in moles, especially if there is a change in a mole's shape or color. ? If you have a mole that is larger than the size of a pencil eraser.  If any of your family members has a history of skin cancer, especially at a young age, talk with your health care provider about genetic screening.  Always use sunscreen. Apply sunscreen liberally and repeatedly throughout the day.  Whenever you are outside, protect yourself by wearing long sleeves, pants, a wide-brimmed hat, and sunglasses. What should I know about osteoporosis? Osteoporosis is a condition in which bone destruction  happens more quickly than new bone creation. After menopause, you may be at an increased risk for osteoporosis. To help prevent osteoporosis or the bone fractures that can happen because of osteoporosis, the following is recommended:  If you are 71-33 years old, get at least 1,000 mg of calcium and at least 600 mg of vitamin D per day.  If you are older than age 88 but younger than age 34, get at least 1,200 mg of calcium and at least 600 mg of vitamin D per day.  If you are older than age 28, get at least 1,200 mg of calcium and at least 800 mg of vitamin D per day. Smoking and excessive alcohol intake increase the risk of osteoporosis. Eat foods that are rich  in calcium and vitamin D, and do weight-bearing exercises several times each week as directed by your health care provider. What should I know about how menopause affects my mental health? Depression may occur at any age, but it is more common as you become older. Common symptoms of depression include:  Low or sad mood.  Changes in sleep patterns.  Changes in appetite or eating patterns.  Feeling an overall lack of motivation or enjoyment of activities that you previously enjoyed.  Frequent crying spells. Talk with your health care provider if you think that you are experiencing depression. What should I know about immunizations? It is important that you get and maintain your immunizations. These include:  Tetanus, diphtheria, and pertussis (Tdap) booster vaccine.  Influenza every year before the flu season begins.  Pneumonia vaccine.  Shingles vaccine. Your health care provider may also recommend other immunizations. This information is not intended to replace advice given to you by your health care provider. Make sure you discuss any questions you have with your health care provider. Document Released: 12/19/2005 Document Revised: 05/16/2016 Document Reviewed: 07/31/2015 Elsevier Interactive Patient Education  2019  [employer].

## 2018-11-24 NOTE — Progress Notes (Signed)
Patient ID: Amy Petty, female    DOB: [birth year]  Age: 63 y.o. MRN: [id number]  The patient is here for annual preventive  examination and management of other chronic and acute problems.    PAP normal  2019 August  Colonoscopy due.   wants to go to go to [facility]     The risk factors are reflected in the social history.  The roster of all physicians providing medical care to patient - is listed in the Snapshot section of the chart.  Activities of daily living:  The patient is 100% independent in all ADLs: dressing, toileting, feeding as well as independent mobility  Home safety : The patient has smoke detectors in the home. They wear seatbelts.  There are no firearms at home. There is no violence in the home.   There is no risks for hepatitis, STDs or HIV. There is no   history of blood transfusion. They have no travel history to infectious disease endemic areas of the world.  The patient has seen their dentist in the last six month. They have seen their eye doctor in the last year. They deny any  hearing difficulty with regard to whispered voices and some television programs.   Discussed the need for sun protection: hats, long sleeves and use of sunscreen if there is significant sun exposure.   Diet: the importance of a healthy diet is discussed. They do have a healthy diet.  The benefits of regular aerobic exercise were discussed. She does not exercise regularly. .   Depression screen: there are no signs or vegative symptoms of depression- irritability, change in appetite, anhedonia, sadness/tearfullness.  The following portions of the patient's history were reviewed and updated as appropriate: allergies, current medications, past family history, past medical history,  past surgical history, past social history  and problem list.  Visual acuity was not assessed per patient preference since she has regular follow up with her ophthalmologist. Hearing and body mass index were  assessed and reviewed.   During the course of the visit the patient was educated and counseled about appropriate screening and preventive services including : fall prevention , diabetes screening, nutrition counseling, colorectal cancer screening, and recommended immunizations.    CC: The primary encounter diagnosis was Essential hypertension. Diagnoses of Prediabetes, Neuropathy, Breast cancer screening, Colon cancer screening, Peripheral polyneuropathy, Encounter for general adult medical examination with abnormal findings, Hyperlipidemia LDL goal <100, and SVT (supraventricular tachycardia) ([place]) were also pertinent to this visit.   1) right foot neurapathy x 1 month , involving  Toes 2, 3, and 4  .  No history of trauma.  Also feels  numb on right anterior thigh,   no alcohol use and no use of Well water entire life,    2) pes anserine bursitis  Affecting her left knee following the fall onto a steel straight back chair (straddled it).  Ortho managing:  Last aspiration and  injection Jan 6  30 day follow up planned .      3) back pain, chronic .  No radiculopathy.   Aggravated by housework and other activities that require stooping or bending.  using tramadol once daily,  Tylenol and aleve bid   History Nikitia has a past medical history of Anemia, Diastolic dysfunction, Fibroid, uterus, Hypertension, Hypothyroidism, Obesity, PVC's (premature ventricular contractions), and SVT (supraventricular tachycardia) ([place]).   She has a past surgical history that includes Tubal ligation; Carpal tunnel release; Tonsillectomy and adenoidectomy; and Carpal tunnel release (2008).  Her family history includes Breast cancer (age of onset: 1) in her mother; Cancer in her father and mother; Diabetes in her sister; Liver cancer in her father; Lung cancer in her mother.She reports that she has never smoked. She has never used smokeless tobacco. She reports that she does not drink alcohol or use drugs.  Outpatient  Medications Prior to Visit  Medication Sig Dispense Refill  . acetaminophen (TYLENOL) 650 MG CR tablet Take 650 mg by mouth every 8 (eight) hours.    Marland Kitchen albuterol (PROVENTIL HFA;VENTOLIN HFA) 108 (90 Base) MCG/ACT inhaler Inhale 2 puffs into the lungs every 6 (six) hours as needed for wheezing or shortness of breath. 1 Inhaler 0  . amLODipine (NORVASC) 2.5 MG tablet TAKE 1 TABLET BY MOUTH EVERY DAY 90 tablet 1  . cholecalciferol (VITAMIN D) 1000 units tablet Take 1,000 Units by mouth daily.    Marland Kitchen ketoconazole (NIZORAL) 2 % shampoo WASH SCALP 3-4 TIMES PER WEEK. LET SIT SEVERAL MINUTES BEFORE RINSING  3  . Melatonin 2.5 MG CHEW Chew 1 tablet by mouth at bedtime.    . metoprolol succinate (TOPROL-XL) 100 MG 24 hr tablet TAKE ONE TABLET BY MOUTH ONE TIME DAILY WITH FOOD OR IMMEDIATELY FOLLOWING A MEAL 90 tablet 1  . montelukast (SINGULAIR) 10 MG tablet TAKE 1 TABLET BY MOUTH EVERYDAY AT BEDTIME 90 tablet 1  . Multiple Vitamin (MULTIVITAMIN) tablet Take 1 tablet by mouth daily.      . mupirocin cream (BACTROBAN) 2 % APPLY 1 APPLICATION TOPICALLY 2 (TWO) TIMES DAILY. 15 g 1  . omeprazole (PRILOSEC) 40 MG capsule TAKE 1 CAPSULE BY MOUTH EVERY DAY 90 capsule 1  . spironolactone (ALDACTONE) 50 MG tablet Take 1 tablet (50 mg total) by mouth daily. 90 tablet 1  . triamcinolone (NASACORT ALLERGY 24HR) 55 MCG/ACT AERO nasal inhaler Place 2 sprays into the nose daily.    . meloxicam (MOBIC) 7.5 MG tablet Take 1 tablet (7.5 mg total) by mouth daily. 30 tablet 1  . naproxen (NAPROSYN) 500 MG tablet naproxen 500 mg tablet    . chlorpheniramine-HYDROcodone (TUSSIONEX PENNKINETIC ER) 10-8 MG/5ML SUER Take 10 mLs by mouth at bedtime as needed for cough. (Patient not taking: Reported on 11/24/2018) 140 mL 0  . clarithromycin (BIAXIN) 500 MG tablet Take 500 mg by mouth 2 (two) times daily.    . phentermine (ADIPEX-P) 37.5 MG tablet Take 1 tablet (37.5 mg total) by mouth daily before breakfast. (Patient not taking:  Reported on 11/24/2018) 30 tablet 2  . spironolactone (ALDACTONE) 50 MG tablet TAKE 1 TABLET BY MOUTH EVERY DAY (Patient not taking: Reported on 11/24/2018) 90 tablet 0  . traMADol (ULTRAM) 50 MG tablet TAKE 1 TABLET BY MOUTH EVERY 8 HOURS AS NEEDED (Patient not taking: Reported on 11/24/2018) 90 tablet 2   No facility-administered medications prior to visit.     Review of Systems   Patient denies headache, fevers, malaise, unintentional weight loss, skin rash, eye pain, sinus congestion and sinus pain, sore throat, dysphagia,  hemoptysis , cough, dyspnea, wheezing, chest pain, palpitations, orthopnea, edema, abdominal pain, nausea, melena, diarrhea, constipation, flank pain, dysuria, hematuria, urinary  Frequency, nocturia, numbness, tingling, seizures,  Focal weakness, Loss of consciousness,  Tremor, insomnia, depression, anxiety, and suicidal ideation.      Objective:  BP 122/84 (BP Location: Left Arm, Patient Position: Sitting, Cuff Size: Large)   Pulse 89   Temp 98 F (36.7 C) (Oral)   Resp 15   Ht 5' 7.5" (1.715  m)   Wt 284 lb 9.6 oz (129.1 kg)   LMP 11/23/2013   SpO2 94%   BMI 43.92 kg/m   Physical Exam  General appearance: alert, cooperative and appears stated age Head: Normocephalic, without obvious abnormality, atraumatic Eyes: conjunctivae/corneas clear. PERRL, EOM's intact. Fundi benign. Ears: normal TM's and external ear canals both ears Nose: Nares normal. Septum midline. Mucosa normal. No drainage or sinus tenderness. Throat: lips, mucosa, and tongue normal; teeth and gums normal Neck: no adenopathy, no carotid bruit, no JVD, supple, symmetrical, trachea midline and thyroid not enlarged, symmetric, no tenderness/mass/nodules Lungs: clear to auscultation bilaterally Breasts: pendulous,  no masses or tenderness Heart: regular rate and rhythm, S1, S2 normal, no murmur, click, rub or gallop Abdomen: soft, non-tender; bowel sounds normal; no masses,  no  organomegaly Extremities: extremities normal, atraumatic, no cyanosis or edema Pulses: 2+ and symmetric Skin: Skin color, texture, turgor normal. No rashes or lesions Neurologic: Alert and oriented X 3, normal strength and tone. Normal symmetric reflexes. Normal coordination and gait.  decreased sensation to light touch over plantar surface of toes 2, 3 and 4 on right foot only.    Assessment & Plan:   Problem List Items Addressed This Visit    Encounter for general adult medical examination with abnormal findings    Screening labs for peripheral neuropathy done.  coounseling on weight given    .age appropriate education and counseling updated, referrals for preventative services and immunizations addressed, dietary and smoking counseling addressed, most recent labs reviewed.  I have personally reviewed and have noted:  1) the patient's medical and social history 2) The pt's use of alcohol, tobacco, and illicit drugs 3) The patient's current medications and supplements 4) Functional ability including ADL's, fall risk, home safety risk, hearing and visual impairment 5) Diet and physical activities 6) Evidence for depression or mood disorder 7) The patient's height, weight, and BMI have been recorded in the chart  I have made referrals, and provided counseling and education based on review of the above      HTN (hypertension) - Primary    Well controlled on current regimen of amlodipine spironolactone and metoprolol.  Renal function stable, no changes today.  Lab Results  Component Value Date   CREATININE 0.76 11/24/2018   Lab Results  Component Value Date   NA 139 11/24/2018   K 4.3 11/24/2018   CL 101 11/24/2018   CO2 23 11/24/2018         Relevant Orders   Comprehensive metabolic panel (Completed)   Urine Microalbumin w/creat. ratio (Completed)   Hyperlipidemia LDL goal <100    10 year risk 8%  .  No indication for statin therapy.  Lab Results  Component Value  Date   CHOL 198 11/24/2018   HDL 54.60 11/24/2018   LDLCALC 124 (H) 11/24/2018   TRIG 95.0 11/24/2018   CHOLHDL 4 11/24/2018         Peripheral neuropathy    With meralgia paresthetica likely as the cause for the right thigh  However the etiology of her toe numbness is uncelar.  Screening labs note prediabetes only,  And she has no history of well water use. EMG / [place] studies offered but deferred for now.   Lab Results  Component Value Date   HGBA1C 6.0 11/24/2018   No results found for: RPR Lab Results  Component Value Date   TSH 2.50 11/24/2018         Prediabetes    Her  random glucose is not  elevated but her previous A1c continues to suggest that  she is at risk for developing diabetes.  I commend her for following a low glycemic index diet and recommended regular participation y in an aerobic  exercise activity.  We should check an A1c in 6 months.   Lab Results  Component Value Date   HGBA1C 6.0 11/24/2018         Relevant Orders   Hemoglobin A1c (Completed)   Lipid panel (Completed)   SVT (supraventricular tachycardia) (HCC)    Resolved S/p cardiac ablation dec 2016.  Continue metoprolol        Other Visit Diagnoses    Neuropathy       Relevant Orders   B12 and Folate Panel (Completed)   TSH (Completed)   RPR (Completed)   Breast cancer screening       Relevant Orders   MM 3D SCREEN BREAST BILATERAL   Colon cancer screening       Relevant Orders   Ambulatory referral to Gastroenterology      I have discontinued Sherica Paternostro. Huard's phentermine, chlorpheniramine-HYDROcodone, clarithromycin, meloxicam, naproxen, and traMADol. I am also having her start on Zoster Vaccine Adjuvanted and traMADol. Additionally, I am having her maintain her multivitamin, Melatonin, acetaminophen, mupirocin cream, albuterol, cholecalciferol, triamcinolone, omeprazole, ketoconazole, spironolactone, amLODipine, montelukast, and metoprolol succinate.  Meds ordered this encounter   Medications  . Zoster Vaccine Adjuvanted [facility]) injection    Sig: Inject 0.5 mLs into the muscle once for 1 dose.    Dispense:  1 each    Refill:  1  . traMADol (ULTRAM) 50 MG tablet    Sig: Take 1 tablet (50 mg total) by mouth every 8 (eight) hours as needed.    Dispense:  30 tablet    Refill:  0    Medications Discontinued During This Encounter  Medication Reason  . spironolactone (ALDACTONE) 50 MG tablet Duplicate  . chlorpheniramine-HYDROcodone (TUSSIONEX PENNKINETIC ER) 10-8 MG/5ML SUER   . meloxicam (MOBIC) 7.5 MG tablet   . naproxen (NAPROSYN) 500 MG tablet   . clarithromycin (BIAXIN) 500 MG tablet   . phentermine (ADIPEX-P) 37.5 MG tablet   . traMADol (ULTRAM) 50 MG tablet     Follow-up: No follow-ups on file.   Crecencio Mc, MD

## 2018-11-25 ENCOUNTER — Other Ambulatory Visit: Payer: Self-pay

## 2018-11-25 DIAGNOSIS — Z1211 Encounter for screening for malignant neoplasm of colon: Secondary | ICD-10-CM

## 2018-11-25 LAB — B12 AND FOLATE PANEL
Folate: >24 ng/mL (ref >5.9)
Vitamin B-12: >1500 pg/mL — ABNORMAL HIGH (ref 211–911)

## 2018-11-25 LAB — HEMOGLOBIN A1C: HEMOGLOBIN A1C: 6 % (ref 4.6–6.5)

## 2018-11-25 LAB — MICROALBUMIN / CREATININE URINE RATIO
Creatinine,U: 19.5 mg/dL
Microalb Creat Ratio: 3.6 mg/g (ref 0.0–30.0)
Microalb, Ur: <0.7 mg/dL (ref 0.0–1.9)

## 2018-11-25 LAB — TSH: TSH: 2.5 u[IU]/mL (ref 0.35–4.50)

## 2018-11-25 LAB — COMPREHENSIVE METABOLIC PANEL
ALT: 21 U/L (ref 0–35)
AST: 20 U/L (ref 0–37)
Albumin: 4.5 g/dL (ref 3.5–5.2)
Alkaline Phosphatase: 100 U/L (ref 39–117)
BUN: 21 mg/dL (ref 6–23)
CO2: 23 mEq/L (ref 19–32)
Calcium: 10.4 mg/dL (ref 8.4–10.5)
Chloride: 101 mEq/L (ref 96–112)
Creatinine, Ser: 0.76 mg/dL (ref 0.40–1.20)
GFR: 82.04 mL/min (ref >60.00)
Glucose, Bld: 77 mg/dL (ref 70–99)
Potassium: 4.3 mEq/L (ref 3.5–5.1)
Sodium: 139 mEq/L (ref 135–145)
Total Bilirubin: 0.5 mg/dL (ref 0.2–1.2)
Total Protein: 7.9 g/dL (ref 6.0–8.3)

## 2018-11-25 LAB — LIPID PANEL
Cholesterol: 198 mg/dL (ref 0–200)
HDL: 54.6 mg/dL (ref >39.00)
LDL Cholesterol: 124 mg/dL — ABNORMAL HIGH (ref 0–99)
NONHDL: 143.13
Total CHOL/HDL Ratio: 4
Triglycerides: 95 mg/dL (ref 0.0–149.0)
VLDL: 19 mg/dL (ref 0.0–40.0)

## 2018-11-25 LAB — RPR: RPR: NONREACTIVE

## 2018-11-27 DIAGNOSIS — G629 Polyneuropathy, unspecified: Secondary | ICD-10-CM | POA: Insufficient documentation

## 2018-11-27 NOTE — Assessment & Plan Note (Signed)
Her  random glucose is not  elevated but her previous A1c continues to suggest that  she is at risk for developing diabetes.  I commend her for following a low glycemic index diet and recommended regular participation y in an aerobic  exercise activity.  We should check an A1c in 6 months.   Lab Results  Component Value Date   HGBA1C 6.0 11/24/2018

## 2018-11-27 NOTE — Assessment & Plan Note (Signed)
With meralgia paresthetica likely as the cause for the right thigh  However the etiology of her toe numbness is uncelar.  Screening labs note prediabetes only,  And she has no history of well water use. EMG / [place] studies offered but deferred for now.   Lab Results  Component Value Date   HGBA1C 6.0 11/24/2018   No results found for: RPR Lab Results  Component Value Date   TSH 2.50 11/24/2018

## 2018-11-27 NOTE — Assessment & Plan Note (Signed)
Resolved S/p cardiac ablation dec 2016.  Continue metoprolol

## 2018-11-27 NOTE — Assessment & Plan Note (Signed)
Well controlled on current regimen of amlodipine spironolactone and metoprolol.  Renal function stable, no changes today.  Lab Results  Component Value Date   CREATININE 0.76 11/24/2018   Lab Results  Component Value Date   NA 139 11/24/2018   K 4.3 11/24/2018   CL 101 11/24/2018   CO2 23 11/24/2018

## 2018-11-27 NOTE — Assessment & Plan Note (Signed)
10 year risk 8%  .  No indication for statin therapy.  Lab Results  Component Value Date   CHOL 198 11/24/2018   HDL 54.60 11/24/2018   LDLCALC 124 (H) 11/24/2018   TRIG 95.0 11/24/2018   CHOLHDL 4 11/24/2018

## 2018-11-27 NOTE — Assessment & Plan Note (Addendum)
Screening labs for peripheral neuropathy done.  coounseling on weight given    .age appropriate education and counseling updated, referrals for preventative services and immunizations addressed, dietary and smoking counseling addressed, most recent labs reviewed.  I have personally reviewed and have noted:  1) the patient's medical and social history 2) The pt's use of alcohol, tobacco, and illicit drugs 3) The patient's current medications and supplements 4) Functional ability including ADL's, fall risk, home safety risk, hearing and visual impairment 5) Diet and physical activities 6) Evidence for depression or mood disorder 7) The patient's height, weight, and BMI have been recorded in the chart  I have made referrals, and provided counseling and education based on review of the above

## 2018-12-13 ENCOUNTER — Encounter: Payer: Self-pay | Admitting: Internal Medicine

## 2018-12-27 ENCOUNTER — Ambulatory Visit
Admission: RE | Admit: 2018-12-27 | Discharge: 2018-12-27 | Disposition: A | Discharge status: Home / Self Care | Payer: 59 | Source: Ambulatory Visit | Attending: Internal Medicine | Admitting: Internal Medicine

## 2018-12-27 DIAGNOSIS — Z1239 Encounter for other screening for malignant neoplasm of breast: Secondary | ICD-10-CM

## 2018-12-27 DIAGNOSIS — Z1231 Encounter for screening mammogram for malignant neoplasm of breast: Secondary | ICD-10-CM | POA: Insufficient documentation

## 2018-12-29 ENCOUNTER — Encounter: Payer: Self-pay | Admitting: Internal Medicine

## 2019-01-05 ENCOUNTER — Other Ambulatory Visit: Payer: Self-pay | Admitting: Internal Medicine

## 2019-01-24 ENCOUNTER — Telehealth: Payer: Self-pay | Admitting: Gastroenterology

## 2019-01-24 NOTE — Telephone Encounter (Signed)
PT left vm to r/s her procedure please call pt

## 2019-01-25 ENCOUNTER — Telehealth: Payer: Self-pay

## 2019-01-25 NOTE — Telephone Encounter (Signed)
Returned patients call in regards to rescheduling 02/04/19 colonoscopy with Dr. Bonna Gains.  Had to LVM for her.  Thanks [place]

## 2019-01-25 NOTE — Telephone Encounter (Signed)
Patient has rescheduled her colonoscopy to  June 3rd, [facility] Dr. Bonna Gains.  Thanks [employer]

## 2019-02-11 ENCOUNTER — Other Ambulatory Visit: Payer: Self-pay

## 2019-03-31 ENCOUNTER — Other Ambulatory Visit: Payer: Self-pay

## 2019-03-31 MED ORDER — NA SULFATE-K SULFATE-MG SULF 17.5-3.13-1.6 GM/177ML PO SOLN: 1.0000 | Freq: Once | ORAL | 0 refills | Status: AC

## 2019-04-01 ENCOUNTER — Other Ambulatory Visit: Payer: Self-pay | Admitting: Internal Medicine

## 2019-04-07 ENCOUNTER — Inpatient Hospital Stay: Admission: RE | Admit: 2019-04-07 | Payer: 59 | Source: Ambulatory Visit

## 2019-04-10 ENCOUNTER — Other Ambulatory Visit: Payer: Self-pay | Admitting: Internal Medicine

## 2019-04-20 ENCOUNTER — Other Ambulatory Visit: Payer: Self-pay | Admitting: Internal Medicine

## 2019-04-27 ENCOUNTER — Telehealth: Payer: Self-pay

## 2019-04-27 ENCOUNTER — Other Ambulatory Visit: Payer: Self-pay

## 2019-04-27 NOTE — Telephone Encounter (Signed)
Patient has been made aware of COVID 19 test requirement.  She has opted to reschedule her colonoscopy due to the rise of COVID cases in our area.  She has been rescheduled to August 6th. COVID test 06/13/19.  Endoscopy has been informed of date change.  Thanks [employer]

## 2019-04-29 ENCOUNTER — Inpatient Hospital Stay: Admission: RE | Admit: 2019-04-29 | Payer: 59 | Source: Ambulatory Visit

## 2019-05-01 ENCOUNTER — Other Ambulatory Visit: Payer: Self-pay | Admitting: Internal Medicine

## 2019-06-13 ENCOUNTER — Other Ambulatory Visit: Payer: Self-pay

## 2019-06-13 ENCOUNTER — Other Ambulatory Visit: Admission: RE | Admit: 2019-06-13 | Payer: 59 | Source: Ambulatory Visit

## 2019-06-13 ENCOUNTER — Telehealth: Payer: Self-pay | Admitting: Gastroenterology

## 2019-06-13 DIAGNOSIS — Z01812 Encounter for preprocedural laboratory examination: Secondary | ICD-10-CM

## 2019-06-13 DIAGNOSIS — Z1211 Encounter for screening for malignant neoplasm of colon: Secondary | ICD-10-CM

## 2019-06-13 NOTE — Telephone Encounter (Signed)
Patient called & l/m on v/m stating she would like to cancel her colonoscopy for 06-16-19 because of covid. She does not feel comfortable. She would like to r/e to the end of Oct.or November 2020.

## 2019-06-16 ENCOUNTER — Encounter: Admission: RE | Payer: Self-pay | Source: Home / Self Care

## 2019-06-16 ENCOUNTER — Ambulatory Visit: Admission: RE | Admit: 2019-06-16 | Payer: 59 | Source: Home / Self Care | Admitting: Gastroenterology

## 2019-06-16 SURGERY — COLONOSCOPY WITH PROPOFOL
Anesthesia: General

## 2019-06-30 ENCOUNTER — Other Ambulatory Visit: Payer: Self-pay | Admitting: Internal Medicine

## 2019-08-15 ENCOUNTER — Telehealth: Payer: Self-pay

## 2019-08-15 NOTE — Telephone Encounter (Signed)
Patients colonoscopy has been rescheduled from 09/06/19 with Dr. Bonna Gains to Dr. Allen Norris on 09/05/19 due to patients schedule. Kim at [facility] has been informed of date change. New Instructions will be mailed.  Advise patient of her COVID test date 09/01/19 (Thursday).  Thanks [employer]

## 2019-08-29 ENCOUNTER — Other Ambulatory Visit: Payer: Self-pay

## 2019-08-29 ENCOUNTER — Encounter: Payer: Self-pay | Admitting: *Deleted

## 2019-09-01 ENCOUNTER — Other Ambulatory Visit: Payer: Self-pay

## 2019-09-01 ENCOUNTER — Other Ambulatory Visit
Admission: RE | Admit: 2019-09-01 | Discharge: 2019-09-01 | Disposition: A | Discharge status: Home / Self Care | Payer: 59 | Source: Ambulatory Visit | Attending: Gastroenterology | Admitting: Gastroenterology

## 2019-09-01 DIAGNOSIS — Z01812 Encounter for preprocedural laboratory examination: Secondary | ICD-10-CM | POA: Insufficient documentation

## 2019-09-01 DIAGNOSIS — Z20828 Contact with and (suspected) exposure to other viral communicable diseases: Secondary | ICD-10-CM | POA: Insufficient documentation

## 2019-09-01 LAB — SARS CORONAVIRUS 2 (TAT 6-24 HRS): SARS Coronavirus 2: NEGATIVE

## 2019-09-02 NOTE — Discharge Instructions (Signed)

## 2019-09-05 ENCOUNTER — Encounter
Admission: RE | Disposition: A | Discharge status: Home / Self Care | Payer: Self-pay | Source: Home / Self Care | Attending: Gastroenterology

## 2019-09-05 ENCOUNTER — Ambulatory Visit: Payer: 59 | Admitting: Anesthesiology

## 2019-09-05 ENCOUNTER — Other Ambulatory Visit: Payer: Self-pay

## 2019-09-05 ENCOUNTER — Ambulatory Visit
Admission: RE | Admit: 2019-09-05 | Discharge: 2019-09-05 | Disposition: A | Discharge status: Home / Self Care | Payer: 59 | Attending: Gastroenterology | Admitting: Gastroenterology

## 2019-09-05 DIAGNOSIS — E669 Obesity, unspecified: Secondary | ICD-10-CM | POA: Insufficient documentation

## 2019-09-05 DIAGNOSIS — Z1211 Encounter for screening for malignant neoplasm of colon: Secondary | ICD-10-CM

## 2019-09-05 DIAGNOSIS — Z882 Allergy status to sulfonamides status: Secondary | ICD-10-CM | POA: Diagnosis not present

## 2019-09-05 DIAGNOSIS — K219 Gastro-esophageal reflux disease without esophagitis: Secondary | ICD-10-CM | POA: Diagnosis not present

## 2019-09-05 DIAGNOSIS — K5730 Diverticulosis of large intestine without perforation or abscess without bleeding: Secondary | ICD-10-CM | POA: Insufficient documentation

## 2019-09-05 DIAGNOSIS — Z888 Allergy status to other drugs, medicaments and biological substances status: Secondary | ICD-10-CM | POA: Insufficient documentation

## 2019-09-05 DIAGNOSIS — I10 Essential (primary) hypertension: Secondary | ICD-10-CM | POA: Insufficient documentation

## 2019-09-05 DIAGNOSIS — K640 First degree hemorrhoids: Secondary | ICD-10-CM | POA: Insufficient documentation

## 2019-09-05 DIAGNOSIS — J45909 Unspecified asthma, uncomplicated: Secondary | ICD-10-CM | POA: Insufficient documentation

## 2019-09-05 DIAGNOSIS — Z6841 Body Mass Index (BMI) 40.0 and over, adult: Secondary | ICD-10-CM | POA: Diagnosis not present

## 2019-09-05 DIAGNOSIS — M1990 Unspecified osteoarthritis, unspecified site: Secondary | ICD-10-CM | POA: Insufficient documentation

## 2019-09-05 DIAGNOSIS — Z79899 Other long term (current) drug therapy: Secondary | ICD-10-CM | POA: Insufficient documentation

## 2019-09-05 DIAGNOSIS — Z880 Allergy status to penicillin: Secondary | ICD-10-CM | POA: Diagnosis not present

## 2019-09-05 HISTORY — DX: Unspecified asthma, uncomplicated: J45.909

## 2019-09-05 HISTORY — DX: Motion sickness, initial encounter: T75.3XXA

## 2019-09-05 HISTORY — PX: COLONOSCOPY WITH PROPOFOL: SHX5780

## 2019-09-05 HISTORY — DX: Gastro-esophageal reflux disease without esophagitis: K21.9

## 2019-09-05 HISTORY — DX: Unspecified osteoarthritis, unspecified site: M19.90

## 2019-09-05 SURGERY — COLONOSCOPY WITH PROPOFOL
Anesthesia: General | Site: Rectum

## 2019-09-05 MED ORDER — ACETAMINOPHEN 325 MG PO TABS: 325.0000 mg | ORAL_TABLET | Freq: Once | ORAL | Status: DC

## 2019-09-05 MED ORDER — STERILE WATER FOR IRRIGATION IR SOLN
Status: DC | PRN
  Administered 2019-09-05: 10:00:00 100 mL

## 2019-09-05 MED ORDER — LACTATED RINGERS IV SOLN
INTRAVENOUS | Status: DC
  Administered 2019-09-05: 09:00:00 via INTRAVENOUS

## 2019-09-05 MED ORDER — PROPOFOL 10 MG/ML IV BOLUS
INTRAVENOUS | Status: DC | PRN
  Administered 2019-09-05: 150 mg via INTRAVENOUS
  Administered 2019-09-05 (×3): 30 mg via INTRAVENOUS
  Administered 2019-09-05 (×2): 20 mg via INTRAVENOUS
  Administered 2019-09-05: 30 mg via INTRAVENOUS
  Administered 2019-09-05: 3 mg via INTRAVENOUS
  Administered 2019-09-05: 30 mg via INTRAVENOUS

## 2019-09-05 MED ORDER — ACETAMINOPHEN 160 MG/5ML PO SOLN: 325.0000 mg | Freq: Once | ORAL | Status: DC

## 2019-09-05 MED ORDER — LIDOCAINE HCL (CARDIAC) PF 100 MG/5ML IV SOSY
PREFILLED_SYRINGE | INTRAVENOUS | Status: DC | PRN
  Administered 2019-09-05: 30 mg via INTRAVENOUS

## 2019-09-05 SURGICAL SUPPLY — 24 items

## 2019-09-05 NOTE — Anesthesia Preprocedure Evaluation (Signed)
Anesthesia Evaluation  Patient identified by MRN, date of birth, ID band Patient awake    Reviewed: Allergy & Precautions, H&P , NPO status , Patient's Chart, lab work & pertinent test results  Airway Mallampati: III  TM Distance: >3 FB Neck ROM: full    Dental no notable dental hx.    Pulmonary asthma ,    Pulmonary exam normal breath sounds clear to auscultation       Cardiovascular hypertension, Normal cardiovascular examSupra Ventricular Tachycardia  Rhythm:regular Rate:Normal     Neuro/Psych    GI/Hepatic GERD  ,  Endo/Other  Hypothyroidism Morbid obesity  Renal/GU      Musculoskeletal   Abdominal   Peds  Hematology   Anesthesia Other Findings   Reproductive/Obstetrics                             Anesthesia Physical Anesthesia Plan  ASA: III  Anesthesia Plan: General   Post-op Pain Management:    Induction: Intravenous  PONV Risk Score and Plan: 3 and Propofol infusion, Treatment may vary due to age or medical condition and TIVA  Airway Management Planned: Natural Airway  Additional Equipment:   Intra-op Plan:   Post-operative Plan:   Informed Consent: I have reviewed the patients History and Physical, chart, labs and discussed the procedure including the risks, benefits and alternatives for the proposed anesthesia with the patient or authorized representative who has indicated his/her understanding and acceptance.       Plan Discussed with: CRNA  Anesthesia Plan Comments:         Anesthesia Quick Evaluation

## 2019-09-05 NOTE — H&P (Signed)
Lucilla Lame, MD [facility] [address] [place], [place] [address] Phone: [phone] Fax : [phone]  Primary Care Physician:  Crecencio Mc, MD Primary Gastroenterologist:  Dr. Allen Norris  Pre-Procedure History & Physical: HPI:  Amy Petty is a 63 y.o. female is here for a screening colonoscopy.   Past Medical History:  Diagnosis Date  . Anemia    iron deficiency, due to fibroids  . Arthritis   . Bronchial asthma   . Diastolic dysfunction    Grade 1 per echo Oct 2012; EF is normal.  . Fibroid, uterus    with menorrhagia  . GERD (gastroesophageal reflux disease)   . Hypertension   . Hypothyroidism   . Motion sickness   . Obesity   . PVC's (premature ventricular contractions)   . SVT (supraventricular tachycardia) (HCC)     Past Surgical History:  Procedure Laterality Date  . CARPAL TUNNEL RELEASE    . CARPAL TUNNEL RELEASE  2008   right hand. wears  brace on left  . TONSILLECTOMY AND ADENOIDECTOMY    . TUBAL LIGATION      Prior to Admission medications   Medication Sig Start Date End Date Taking? Authorizing Provider  acetaminophen (TYLENOL) 650 MG CR tablet Take 650 mg by mouth every 8 (eight) hours.   Yes [provider]  albuterol (PROVENTIL HFA;VENTOLIN HFA) 108 (90 Base) MCG/ACT inhaler Inhale 2 puffs into the lungs every 6 (six) hours as needed for wheezing or shortness of breath. 02/12/18  Yes Crecencio Mc, MD  Alpha-Lipoic Acid (LIPOIC ACID PO) Take by mouth daily.   Yes [provider]  amLODipine (NORVASC) 2.5 MG tablet TAKE 1 TABLET BY MOUTH EVERY DAY 04/21/19  Yes Crecencio Mc, MD  cholecalciferol (VITAMIN D) 1000 units tablet Take 1,000 Units by mouth daily.   Yes [provider]  ketoconazole (NIZORAL) 2 % shampoo WASH SCALP 3-4 TIMES PER WEEK. LET SIT SEVERAL MINUTES BEFORE RINSING 07/14/18  Yes [provider]  Melatonin 2.5 MG CHEW Chew 1 tablet by mouth at bedtime.   Yes [provider]   metoprolol succinate (TOPROL-XL) 100 MG 24 hr tablet TAKE 1 TABLET BY MOUTH EVERY DAY WITH FOOD OR IMMEDIATELY FOLLOWING A MEAL 04/01/19  Yes Crecencio Mc, MD  montelukast (SINGULAIR) 10 MG tablet TAKE 1 TABLET BY MOUTH EVERYDAY AT BEDTIME 04/11/19  Yes Crecencio Mc, MD  Multiple Vitamin (MULTIVITAMIN) tablet Take 1 tablet by mouth daily.     Yes [provider]  mupirocin cream (BACTROBAN) 2 % APPLY 1 APPLICATION TOPICALLY 2 (TWO) TIMES DAILY. 02/11/17  Yes Crecencio Mc, MD  naproxen sodium (ALEVE) 220 MG tablet Take 220 mg by mouth 2 (two) times daily as needed.   Yes [provider]  omeprazole (PRILOSEC) 40 MG capsule TAKE 1 CAPSULE BY MOUTH EVERY DAY 07/01/19  Yes Crecencio Mc, MD  spironolactone (ALDACTONE) 50 MG tablet TAKE 1 TABLET BY MOUTH EVERY DAY 05/02/19  Yes Crecencio Mc, MD  traMADol (ULTRAM) 50 MG tablet Take 1 tablet (50 mg total) by mouth every 8 (eight) hours as needed. 11/24/18  Yes Crecencio Mc, MD  triamcinolone (NASACORT ALLERGY 24HR) 55 MCG/ACT AERO nasal inhaler Place 2 sprays into the nose daily.   Yes [provider]    Allergies as of 06/13/2019 - Review Complete 11/27/2018  Allergen Reaction Noted  . Gabapentin Diarrhea 10/01/2015  . Penicillins  10/23/2008  . Sulfonamide derivatives  10/23/2008  Family History  Problem Relation Age of Onset  . Cancer Mother        Lung Ca second hand smoke  . Lung cancer Mother   . Breast cancer Mother 6  . Cancer Father        liver and prostate Ca  . Liver cancer Father   . Diabetes Sister     Social History   Socioeconomic History  . Marital status: Married    Spouse name: Not on file  . Number of children: Not on file  . Years of education: Not on file  . Highest education level: Not on file  Occupational History  . Not on file  Social Needs  . Financial resource strain: Not on file  . Food insecurity    Worry: Not on file    Inability: Not on file  .  Transportation needs    Medical: Not on file    Non-medical: Not on file  Tobacco Use  . Smoking status: Never Smoker  . Smokeless tobacco: Never Used  Substance and Sexual Activity  . Alcohol use: No  . Drug use: No  . Sexual activity: Yes  Lifestyle  . Physical activity    Days per week: Not on file    Minutes per session: Not on file  . Stress: Not on file  Relationships  . Social Herbalist on phone: Not on file    Gets together: Not on file    Attends religious service: Not on file    Active member of club or organization: Not on file    Attends meetings of clubs or organizations: Not on file    Relationship status: Not on file  . Intimate partner violence    Fear of current or ex partner: Not on file    Emotionally abused: Not on file    Physically abused: Not on file    Forced sexual activity: Not on file  Other Topics Concern  . Not on file  Social History Narrative  . Not on file    Review of Systems: See HPI, otherwise negative ROS  Physical Exam: BP (!) 142/90   Pulse 75   Temp 97.8 F (36.6 C) (Temporal)   Ht 5\' 9"  (1.753 m)   Wt 129.7 kg   LMP 11/23/2013   SpO2 98%   BMI 42.23 kg/m  General:   Alert,  pleasant and cooperative in NAD Head:  Normocephalic and atraumatic. Neck:  Supple; no masses or thyromegaly. Lungs:  Clear throughout to auscultation.    Heart:  Regular rate and rhythm. Abdomen:  Soft, nontender and nondistended. Normal bowel sounds, without guarding, and without rebound.   Neurologic:  Alert and  oriented x4;  grossly normal neurologically.  Impression/Plan: Amy Petty is now here to undergo a screening colonoscopy.  Risks, benefits, and alternatives regarding colonoscopy have been reviewed with the patient.  Questions have been answered.  All parties agreeable.

## 2019-09-05 NOTE — Anesthesia Procedure Notes (Signed)
Date/Time: 09/05/2019 9:26 AM Performed by: Cameron Ali, CRNA Pre-anesthesia Checklist: Patient identified, Emergency Drugs available, Suction available, Timeout performed and Patient being monitored Patient Re-evaluated:Patient Re-evaluated prior to induction Oxygen Delivery Method: Nasal cannula Placement Confirmation: positive ETCO2

## 2019-09-05 NOTE — Anesthesia Postprocedure Evaluation (Signed)
Anesthesia Post Note  Patient: Amy Petty  Procedure(s) Performed: COLONOSCOPY WITH PROPOFOL (N/A Rectum)  Patient location during evaluation: PACU Anesthesia Type: General Level of consciousness: awake and alert and oriented Pain management: satisfactory to patient Vital Signs Assessment: post-procedure vital signs reviewed and stable Respiratory status: spontaneous breathing, nonlabored ventilation and respiratory function stable Cardiovascular status: blood pressure returned to baseline and stable Postop Assessment: Adequate PO intake and No signs of nausea or vomiting Anesthetic complications: no    Raliegh Ip

## 2019-09-05 NOTE — Transfer of Care (Signed)
Immediate Anesthesia Transfer of Care Note  Patient: Amy Petty  Procedure(s) Performed: COLONOSCOPY WITH PROPOFOL (N/A Rectum)  Patient Location: PACU  Anesthesia Type: General  Level of Consciousness: awake, alert  and patient cooperative  Airway and Oxygen Therapy: Patient Spontanous Breathing and Patient connected to supplemental oxygen  Post-op Assessment: Post-op Vital signs reviewed, Patient's Cardiovascular Status Stable, Respiratory Function Stable, Patent Airway and No signs of Nausea or vomiting  Post-op Vital Signs: Reviewed and stable  Complications: No apparent anesthesia complications

## 2019-09-05 NOTE — Op Note (Signed)
[facility] Patient Name: Amy Petty Procedure Date: 09/05/2019 9:22 AM MRN: [id number] Account #: [id number] Date of Birth: 02-20-1956 Admit Type: Outpatient Age: 63 Room: [facility] OR ROOM 01 Gender: Female Note Status: Finalized Procedure:            Colonoscopy Indications:          Screening for colorectal malignant neoplasm Providers:            Lucilla Lame MD, MD Referring MD:         Deborra Medina, MD (Referring MD) Medicines:            Propofol per Anesthesia Complications:        No immediate complications. Procedure:            Pre-Anesthesia Assessment:                       - Prior to the procedure, a History and Physical was                        performed, and patient medications and allergies were                        reviewed. The patient's tolerance of previous                        anesthesia was also reviewed. The risks and benefits of                        the procedure and the sedation options and risks were                        discussed with the patient. All questions were                        answered, and informed consent was obtained. Prior                        Anticoagulants: The patient has taken no previous                        anticoagulant or antiplatelet agents. ASA Grade                        Assessment: II - A patient with mild systemic disease.                        After reviewing the risks and benefits, the patient was                        deemed in satisfactory condition to undergo the                        procedure.                       After obtaining informed consent, the colonoscope was                        passed under direct vision. Throughout the procedure,  the patient's blood pressure, pulse, and oxygen                        saturations were monitored continuously. The was                        introduced through the anus and advanced to the the            cecum, identified by appendiceal orifice and ileocecal                        valve. The colonoscopy was performed without                        difficulty. The patient tolerated the procedure well.                        The quality of the bowel preparation was excellent. Findings:      The perianal and digital rectal examinations were normal.      A few small-mouthed diverticula were found in the sigmoid colon.      Non-bleeding internal hemorrhoids were found during retroflexion. The       hemorrhoids were Grade I (internal hemorrhoids that do not prolapse). Impression:           - Diverticulosis in the sigmoid colon.                       - Non-bleeding internal hemorrhoids.                       - No specimens collected. Recommendation:       - Discharge patient to home.                       - Resume previous diet.                       - Continue present medications.                       - Await pathology results.                       - Repeat colonoscopy in 10 years for screening unless                        any change in family history or lower GI problems. Procedure Code(s):    --- Professional ---                       [phone], Colonoscopy, flexible; diagnostic, including                        collection of specimen(s) by brushing or washing, when                        performed (separate procedure) Diagnosis Code(s):    --- Professional ---                       Z12.11, Encounter for screening for malignant neoplasm  of colon CPT copyright 2019 American Medical Association. All rights reserved. The codes documented in this report are preliminary and upon coder review may  be revised to meet current compliance requirements. Lucilla Lame MD, MD 09/05/2019 9:41:55 AM This report has been signed electronically. Number of Addenda: 0 Note Initiated On: 09/05/2019 9:22 AM Scope Withdrawal Time: 0 hours 6 minutes 19 seconds  Total Procedure  Duration: 0 hours 10 minutes 29 seconds  Estimated Blood Loss: Estimated blood loss: none.      [facility]

## 2019-09-06 ENCOUNTER — Encounter: Payer: Self-pay | Admitting: Gastroenterology

## 2019-10-03 ENCOUNTER — Other Ambulatory Visit: Payer: Self-pay | Admitting: Internal Medicine

## 2019-10-28 ENCOUNTER — Other Ambulatory Visit: Payer: Self-pay | Admitting: Internal Medicine

## 2019-11-01 ENCOUNTER — Other Ambulatory Visit: Payer: Self-pay | Admitting: Internal Medicine

## 2019-11-08 ENCOUNTER — Other Ambulatory Visit: Payer: Self-pay | Admitting: Internal Medicine

## 2019-11-09 NOTE — Telephone Encounter (Signed)
Refilled: 02/11/2017 Last OV: 11/24/2018 Next OV: not scheduled

## 2019-11-16 ENCOUNTER — Ambulatory Visit: Payer: 59 | Attending: Internal Medicine

## 2019-11-16 DIAGNOSIS — Z20822 Contact with and (suspected) exposure to covid-19: Secondary | ICD-10-CM

## 2019-11-18 ENCOUNTER — Other Ambulatory Visit: Payer: Self-pay | Admitting: Internal Medicine

## 2019-11-18 ENCOUNTER — Other Ambulatory Visit: Payer: 59

## 2019-11-18 LAB — NOVEL CORONAVIRUS, NAA: SARS-CoV-2, NAA: DETECTED — AB

## 2019-11-18 MED ORDER — PREDNISONE 10 MG PO TABS: ORAL_TABLET | ORAL | 0 refills | Status: DC

## 2019-11-18 MED ORDER — HYDROCOD POLST-CPM POLST ER 10-8 MG/5ML PO SUER: 5.0000 mL | Freq: Every evening | ORAL | 0 refills | Status: DC | PRN

## 2019-11-18 NOTE — Progress Notes (Unsigned)
Patient tested positive for covid on Wednesday  1/6  sympotms started on jan 5.  Results ositive today,  Wheezing  Hypoxic  and tight   Sats 90%

## 2019-11-19 ENCOUNTER — Other Ambulatory Visit: Payer: Self-pay | Admitting: Nurse Practitioner

## 2019-11-19 ENCOUNTER — Telehealth: Payer: Self-pay | Admitting: Unknown Physician Specialty

## 2019-11-19 ENCOUNTER — Telehealth: Payer: Self-pay | Admitting: Nurse Practitioner

## 2019-11-19 DIAGNOSIS — U071 COVID-19: Secondary | ICD-10-CM | POA: Insufficient documentation

## 2019-11-19 NOTE — Telephone Encounter (Signed)
  I connected by phone with Amy Petty on 11/19/2019 at 9:23 AM to discuss the potential use of an new treatment for mild to moderate COVID-19 viral infection in non-hospitalized patients.  She tested positive 11/16/2019 with symptom onset 11/15/2019.  Continues to have cough and chest tightness with intermittent wheezing.  BMI 42.9 and has HTN.  This patient is a 64 y.o. female that meets the FDA criteria for Emergency Use Authorization of bamlanivimab or casirivimab\imdevimab.  Has a (+) direct SARS-CoV-2 viral test result  Has mild or moderate COVID-19   Is ? 63 years of age and weighs ? 40 kg  Is NOT hospitalized due to COVID-19  Is NOT requiring oxygen therapy or requiring an increase in baseline oxygen flow rate due to COVID-19  Is within 10 days of symptom onset  Has at least one of the high risk factor(s) for progression to severe COVID-19 and/or hospitalization as defined in EUA.  Specific high risk criteria : BMI >/= 35   I have spoken and communicated the following to the patient or parent/caregiver:  1. FDA has authorized the emergency use of bamlanivimab and casirivimab\imdevimab for the treatment of mild to moderate COVID-19 in adults and pediatric patients with positive results of direct SARS-CoV-2 viral testing who are 27 years of age and older weighing at least 40 kg, and who are at high risk for progressing to severe COVID-19 and/or hospitalization.  2. The significant known and potential risks and benefits of bamlanivimab and casirivimab\imdevimab, and the extent to which such potential risks and benefits are unknown.  3. Information on available alternative treatments and the risks and benefits of those alternatives, including clinical trials.  4. Patients treated with bamlanivimab and casirivimab\imdevimab should continue to self-isolate and use infection control measures (e.g., wear mask, isolate, social distance, avoid sharing personal items, clean and disinfect "high  touch" surfaces, and frequent handwashing) according to CDC guidelines.   5. The patient or parent/caregiver has the option to accept or refuse bamlanivimab or casirivimab\imdevimab .  After reviewing this information with the patient, The patient agreed to proceed with receiving the bamlanimivab infusion and will be provided a copy of the Fact sheet prior to receiving the infusion.[name] 11/19/2019 9:23 AM

## 2019-11-19 NOTE — Progress Notes (Signed)
Bam order set.

## 2019-11-19 NOTE — Telephone Encounter (Signed)
Called to discuss with patient about Covid symptoms and the use of bamlanivimab, a monoclonal antibody infusion for those with mild to moderate Covid symptoms and at a high risk of hospitalization.  Pt is qualified for this infusion at the [facility] due to Age >55, Hypertension and BMI>35   Message left to call back  

## 2019-11-21 ENCOUNTER — Telehealth: Payer: Self-pay | Admitting: Internal Medicine

## 2019-11-21 NOTE — Telephone Encounter (Signed)
Received message from Access Nurse patient was unable to attain medications on Friday due Pharmacy mix up on pharmacy end with a transfer, called patient verified received med's on Saturday and feeling about the same as on Friday no change . Patient is scheduled for [place] Antibody infusion.

## 2019-11-22 ENCOUNTER — Ambulatory Visit (HOSPITAL_COMMUNITY)
Admission: RE | Admit: 2019-11-22 | Discharge: 2019-11-22 | Disposition: A | Discharge status: Home / Self Care | Payer: 59 | Source: Ambulatory Visit | Attending: Pulmonary Disease | Admitting: Pulmonary Disease

## 2019-11-22 ENCOUNTER — Encounter (HOSPITAL_COMMUNITY): Payer: Self-pay

## 2019-11-22 DIAGNOSIS — Z23 Encounter for immunization: Secondary | ICD-10-CM | POA: Diagnosis not present

## 2019-11-22 DIAGNOSIS — U071 COVID-19: Secondary | ICD-10-CM | POA: Diagnosis present

## 2019-11-22 MED ORDER — SODIUM CHLORIDE 0.9 % IV SOLN
700.0000 mg | Freq: Once | INTRAVENOUS | Status: AC
  Administered 2019-11-22: 700 mg via INTRAVENOUS
  Filled 2019-11-22: qty 20

## 2019-11-22 MED ORDER — SODIUM CHLORIDE 0.9 % IV SOLN
INTRAVENOUS | Status: DC | PRN
  Administered 2019-11-22: 250 mL via INTRAVENOUS

## 2019-11-22 MED ORDER — EPINEPHRINE 0.3 MG/0.3ML IJ SOAJ: 0.3000 mg | Freq: Once | INTRAMUSCULAR | Status: DC | PRN

## 2019-11-22 MED ORDER — DIPHENHYDRAMINE HCL 50 MG/ML IJ SOLN: 50.0000 mg | Freq: Once | INTRAMUSCULAR | Status: DC | PRN

## 2019-11-22 MED ORDER — ALBUTEROL SULFATE HFA 108 (90 BASE) MCG/ACT IN AERS: 2.0000 | INHALATION_SPRAY | Freq: Once | RESPIRATORY_TRACT | Status: DC | PRN

## 2019-11-22 MED ORDER — FAMOTIDINE IN NACL 20-0.9 MG/50ML-% IV SOLN: 20.0000 mg | Freq: Once | INTRAVENOUS | Status: DC | PRN

## 2019-11-22 MED ORDER — METHYLPREDNISOLONE SODIUM SUCC 125 MG IJ SOLR: 125.0000 mg | Freq: Once | INTRAMUSCULAR | Status: DC | PRN

## 2019-11-22 NOTE — Discharge Instructions (Signed)

## 2019-11-22 NOTE — Progress Notes (Signed)
  Diagnosis: COVID-19  Physician: Dr. Joya Gaskins  Procedure: [facility] Infusion [facility] Med: bamlanivimab infusion - Provided patient with bamlanimivab fact sheet for patients, parents and caregivers prior to infusion.  Complications: No immediate complications noted.  Discharge: Discharged home   Babs Sciara 11/22/2019

## 2019-11-23 ENCOUNTER — Other Ambulatory Visit: Payer: Self-pay | Admitting: Internal Medicine

## 2019-11-25 ENCOUNTER — Telehealth: Payer: Self-pay | Admitting: Internal Medicine

## 2019-11-25 MED ORDER — HYDROCOD POLST-CPM POLST ER 10-8 MG/5ML PO SUER: 5.0000 mL | Freq: Every evening | ORAL | 0 refills | Status: DC | PRN

## 2019-11-25 MED ORDER — CHERATUSSIN AC 100-10 MG/5ML PO SOLN: 5.0000 mL | Freq: Three times a day (TID) | ORAL | 0 refills | Status: DC | PRN

## 2019-11-25 NOTE — Telephone Encounter (Signed)
Pt called about still having a cough from covid. Pt wants to know what can she take for the cough? Please advise and thank you!  Pharmacy is [facility], [place]  Call pt @ [phone].

## 2019-11-25 NOTE — Telephone Encounter (Signed)
Spoke with pt and she stated that [facility] [place] and they stated that they would not be able to fill the tussionex until January 30th. Is there something else that can be sent in. She stated that is still coughing, dry cough and is tight in the chest but she stated that her O2 good.

## 2019-11-25 NOTE — Telephone Encounter (Signed)
No appts until next week.

## 2019-11-25 NOTE — Telephone Encounter (Signed)
I have refilled the tussionex . Please schedule er an appt for next week since I have not formally seen her in an office visit ,  I just treated her when I treated her husband

## 2019-11-25 NOTE — Telephone Encounter (Signed)
Spoke with pt to let her know that a different medication has been sent in.

## 2019-11-25 NOTE — Addendum Note (Signed)
Addended by: Crecencio Mc on: 11/25/2019 04:39 PM   Modules accepted: Orders

## 2019-11-25 NOTE — Telephone Encounter (Signed)
cheratussin sent,  Has codeine

## 2019-11-29 ENCOUNTER — Encounter: Payer: Self-pay | Admitting: Internal Medicine

## 2019-11-29 ENCOUNTER — Other Ambulatory Visit: Payer: Self-pay

## 2019-11-29 ENCOUNTER — Ambulatory Visit (INDEPENDENT_AMBULATORY_CARE_PROVIDER_SITE_OTHER): Payer: 59 | Admitting: Internal Medicine

## 2019-11-29 DIAGNOSIS — J3481 Nasal mucositis (ulcerative): Secondary | ICD-10-CM | POA: Diagnosis not present

## 2019-11-29 DIAGNOSIS — J4521 Mild intermittent asthma with (acute) exacerbation: Secondary | ICD-10-CM | POA: Diagnosis not present

## 2019-11-29 MED ORDER — HYDROCOD POLST-CPM POLST ER 10-8 MG/5ML PO SUER: 5.0000 mL | Freq: Two times a day (BID) | ORAL | 0 refills | Status: DC

## 2019-11-29 MED ORDER — DOCOSANOL 10 % EX CREA: TOPICAL_CREAM | CUTANEOUS | 0 refills | Status: DC

## 2019-11-29 NOTE — Assessment & Plan Note (Addendum)
Did not respond to mupirocin,  Trial of Abreva

## 2019-11-29 NOTE — Assessment & Plan Note (Signed)
Secondary to COVID infection.  Continue prednisone taper, albuterol,  And cough suppressant

## 2019-11-29 NOTE — Progress Notes (Signed)
Virtual Visit via [email or web address]  This visit type was conducted due to national recommendations for restrictions regarding the COVID-19 pandemic (e.g. social distancing).  This format is felt to be most appropriate for this patient at this time.  All issues noted in this document were discussed and addressed.  No physical exam was performed (except for noted visual exam findings with Video Visits).   I connected with@ on 11/29/19 at  4:30 PM EST by a video enabled telemedicine application  and verified that I am speaking with the correct person using two identifiers. Location patient: home Location provider: work or home office Persons participating in the virtual visit: patient, provider  I discussed the limitations, risks, security and privacy concerns of performing an evaluation and management service by telephone and the availability of in person appointments. I also discussed with the patient that there may be a patient responsible charge related to this service. The patient expressed understanding and agreed to proceed.  Reason for visit: follow up on acute COVID 19 INFECTION  HPI:  64 YR OLD FEMALE diagnosed with acute COVID 19 INFECTION ON January 6,  RECEIVED ANTIBODY INFUSION ON jan 12.  Feeling somewhat better, still coughing with most activities and occasionally at rest.  02 sats remain above 90% most of the time. She reports ulcers in her nose that are not responding to mupirocin   ROS: See pertinent positives and negatives per HPI.  Past Medical History:  Diagnosis Date  . Anemia    iron deficiency, due to fibroids  . Arthritis   . Bronchial asthma   . Diastolic dysfunction    Grade 1 per echo Oct 2012; EF is normal.  . Fibroid, uterus    with menorrhagia  . GERD (gastroesophageal reflux disease)   . Hypertension   . Hypothyroidism   . Motion sickness   . Obesity   . PVC's (premature ventricular contractions)   . SVT (supraventricular tachycardia) (HCC)     Past Surgical  History:  Procedure Laterality Date  . CARPAL TUNNEL RELEASE    . CARPAL TUNNEL RELEASE  2008   right hand. wears  brace on left  . COLONOSCOPY WITH PROPOFOL N/A 09/05/2019   Procedure: COLONOSCOPY WITH PROPOFOL;  Surgeon: Lucilla Lame, MD;  Location: [facility];  Service: Endoscopy;  Laterality: N/A;  . TONSILLECTOMY AND ADENOIDECTOMY    . TUBAL LIGATION      Family History  Problem Relation Age of Onset  . Cancer Mother        Lung Ca second hand smoke  . Lung cancer Mother   . Breast cancer Mother 3  . Cancer Father        liver and prostate Ca  . Liver cancer Father   . Diabetes Sister     SOCIAL HX:  reports that she has never smoked. She has never used smokeless tobacco. She reports that she does not drink alcohol or use drugs.  Current Outpatient Medications:  .  acetaminophen (TYLENOL) 650 MG CR tablet, Take 650 mg by mouth every 8 (eight) hours., Disp: , Rfl:  .  albuterol (PROVENTIL HFA;VENTOLIN HFA) 108 (90 Base) MCG/ACT inhaler, Inhale 2 puffs into the lungs every 6 (six) hours as needed for wheezing or shortness of breath., Disp: 1 Inhaler, Rfl: 0 .  Alpha-Lipoic Acid (LIPOIC ACID PO), Take by mouth daily., Disp: , Rfl:  .  amLODipine (NORVASC) 2.5 MG tablet, TAKE 1 TABLET BY MOUTH EVERY DAY, Disp: 90 tablet, Rfl: 0 .  chlorpheniramine-HYDROcodone (TUSSIONEX PENNKINETIC ER) 10-8 MG/5ML SUER, Take 5 mLs by mouth 2 (two) times daily., Disp: 100 mL, Rfl: 0 .  cholecalciferol (VITAMIN D) 1000 units tablet, Take 1,000 Units by mouth daily., Disp: , Rfl:  .  guaiFENesin-codeine (CHERATUSSIN AC) 100-10 MG/5ML syrup, Take 5 mLs by mouth 3 (three) times daily as needed for cough., Disp: 120 mL, Rfl: 0 .  ketoconazole (NIZORAL) 2 % shampoo, WASH SCALP 3-4 TIMES PER WEEK. LET SIT SEVERAL MINUTES BEFORE RINSING, Disp: , Rfl: 3 .  Melatonin 2.5 MG CHEW, Chew 1 tablet by mouth at bedtime., Disp: , Rfl:  .  metoprolol succinate (TOPROL-XL) 100 MG 24 hr tablet, TAKE 1 TABLET  BY MOUTH EVERY DAY WITH FOOD OR IMMEDIATELY FOLLOWING A MEAL, Disp: 30 tablet, Rfl: 1 .  montelukast (SINGULAIR) 10 MG tablet, TAKE 1 TABLET BY MOUTH EVERYDAY AT BEDTIME, Disp: 30 tablet, Rfl: 1 .  Multiple Vitamin (MULTIVITAMIN) tablet, Take 1 tablet by mouth daily.  , Disp: , Rfl:  .  mupirocin cream (BACTROBAN) 2 %, APPLY 1 APPLICATION TOPICALLY 2 (TWO) TIMES DAILY., Disp: 15 g, Rfl: 1 .  mupirocin ointment (BACTROBAN) 2 %, PLACE 1 APPLICATION INTO THE NOSE 2 (TWO) TIMES DAILY, Disp: 22 g, Rfl: 0 .  naproxen sodium (ALEVE) 220 MG tablet, Take 220 mg by mouth 2 (two) times daily as needed., Disp: , Rfl:  .  omeprazole (PRILOSEC) 40 MG capsule, TAKE 1 CAPSULE BY MOUTH EVERY DAY, Disp: 90 capsule, Rfl: 1 .  predniSONE (DELTASONE) 10 MG tablet, 6 tablets daily for 3 days ,  Then taper by 1 tablet daily until gone, Disp: 33 tablet, Rfl: 0 .  spironolactone (ALDACTONE) 50 MG tablet, TAKE 1 TABLET BY MOUTH EVERY DAY, Disp: 90 tablet, Rfl: 0 .  SYMBICORT 80-4.5 MCG/ACT inhaler, 1 puff 2 (two) times daily., Disp: , Rfl:  .  traMADol (ULTRAM) 50 MG tablet, Take 1 tablet (50 mg total) by mouth every 8 (eight) hours as needed., Disp: 30 tablet, Rfl: 0 .  triamcinolone (NASACORT ALLERGY 24HR) 55 MCG/ACT AERO nasal inhaler, Place 2 sprays into the nose daily., Disp: , Rfl:  .  Docosanol (ABREVA) 10 % CREA, Apply twice daily to cold sores, Disp: 2 g, Rfl: 0  EXAM:  VITALS per patient if applicable:  GENERAL: alert, oriented, appears well and in no acute distress  HEENT: atraumatic, conjunttiva clear, no obvious abnormalities on inspection of external nose and ears  NECK: normal movements of the head and neck  LUNGS: on inspection no signs of respiratory distress, breathing rate appears normal, no obvious gross SOB, gasping or wheezing  CV: no obvious cyanosis  MS: moves all visible extremities without noticeable abnormality  PSYCH/NEURO: pleasant and cooperative, no obvious depression or anxiety,  speech and thought processing grossly intact  ASSESSMENT AND PLAN:  Discussed the following assessment and plan:  Mild intermittent asthmatic bronchitis with acute exacerbation  Ulcerative nasal mucositis  Asthmatic bronchitis with acute exacerbation Secondary to COVID infection.  Continue prednisone taper, albuterol,  And cough suppressant   Ulcerative nasal mucositis Did ot respond to mupirocin,  Trial of Abreva     I discussed the assessment and treatment plan with the patient. The patient was provided an opportunity to ask questions and all were answered. The patient agreed with the plan and demonstrated an understanding of the instructions.   The patient was advised to call back or seek an in-person evaluation if the symptoms worsen or if the condition fails to  improve as anticipated.  Crecencio Mc, MD

## 2019-12-12 ENCOUNTER — Other Ambulatory Visit: Payer: Self-pay | Admitting: Internal Medicine

## 2019-12-12 MED ORDER — BENZONATATE 200 MG PO CAPS: 200.0000 mg | ORAL_CAPSULE | Freq: Three times a day (TID) | ORAL | 1 refills | Status: DC | PRN

## 2019-12-12 MED ORDER — HYDROCOD POLST-CPM POLST ER 10-8 MG/5ML PO SUER: 5.0000 mL | Freq: Two times a day (BID) | ORAL | 0 refills | Status: DC

## 2019-12-18 ENCOUNTER — Other Ambulatory Visit: Payer: Self-pay | Admitting: Internal Medicine

## 2020-01-01 ENCOUNTER — Other Ambulatory Visit: Payer: Self-pay | Admitting: Internal Medicine

## 2020-01-04 ENCOUNTER — Other Ambulatory Visit: Payer: Self-pay | Admitting: Internal Medicine

## 2020-01-10 ENCOUNTER — Telehealth: Payer: Self-pay | Admitting: Internal Medicine

## 2020-01-10 NOTE — Telephone Encounter (Signed)
See patient husband chart call is concerning husband not this patient Amy Petty.

## 2020-01-10 NOTE — Telephone Encounter (Signed)
Pt wants a call back asap

## 2020-01-23 ENCOUNTER — Other Ambulatory Visit: Payer: Self-pay | Admitting: Internal Medicine

## 2020-02-08 ENCOUNTER — Other Ambulatory Visit: Payer: Self-pay

## 2020-02-08 ENCOUNTER — Encounter: Payer: Self-pay | Admitting: Family Medicine

## 2020-02-08 ENCOUNTER — Ambulatory Visit: Payer: 59 | Admitting: Family Medicine

## 2020-02-08 DIAGNOSIS — M25562 Pain in left knee: Secondary | ICD-10-CM

## 2020-02-08 NOTE — Progress Notes (Signed)
Office Visit Note   Patient: Amy Petty           Date of Birth: [birth year]           MRN: [id number] Visit Date: 02/08/2020 Requested by: Crecencio Mc, MD [address] [place],  [place] [id number] PCP: Crecencio Mc, MD  Subjective: Chief Complaint  Patient presents with  . Left Knee - Pain    Knee pain and swelling since yesterday. NKI. H/o Baker's cyst - asp/inj at [facility]. Knee "pings" anterior knee, toward the lateral aspect. Pops. Feels like it "catches." Feeling of "pressure" anterolateral aspect when flexing knee.    HPI: She is here with recurrent left knee pain.  We injected her knee in 2019.  It continued to bother her so she went to [facility] and had another intra-articular injection as well as aspiration and injection of a Baker's cyst.  She did well for a while, almost a year, and then a couple days ago it started hurting again mostly on the anterior lateral aspect.  It pops intermittently but does not lock, feels like he is going to give way sometimes.  No significant pain on the posterior aspect.              ROS:   All other systems were reviewed and are negative.  Objective: Vital Signs: LMP 11/23/2013   Physical Exam:  General:  Alert and oriented, in no acute distress. Pulm:  Breathing unlabored. Psy:  Normal mood, congruent affect. Skin: No erythema. Left knee: 1+ effusion with no warmth.  There is some fullness in the popliteal fossa consistent with a Baker's cyst but it is not tender.  She is moderately tender over the anterolateral joint line.  She has 2+ patellofemoral click with active extension.  Imaging: None today  Assessment & Plan: 1.  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  If symptoms persist, consider MRI scan followed by surgical consult.     Procedures: Left knee injection: After sterile prep with  Betadine, injected 5 cc 1% lidocaine without epinephrine and 40 mg methylprednisolone from lateral midpatellar approach, a flash of clear yellow synovial fluid was obtained prior to injection.    PMFS History: Patient Active Problem List   Diagnosis Date Noted  . Ulcerative nasal mucositis 11/29/2019  . Lab test positive for detection of COVID-19 virus 11/19/2019  . Special screening for malignant neoplasms, colon   . Peripheral neuropathy 11/27/2018  . Post-menopausal bleeding 06/30/2018  . Prediabetes 06/30/2018  . Asthmatic bronchitis with acute exacerbation 02/14/2018  . Hyperlipidemia LDL goal <100 10/05/2016  . Low back pain 09/06/2015  . Encounter for general adult medical examination with abnormal findings 08/11/2012  . Screening for cervical cancer 10/20/2011  . Screening for breast cancer 10/20/2011  . Screening for colon cancer 10/20/2011  . Allergic rhinitis 10/20/2011  . Fibroid, uterus   . SVT (supraventricular tachycardia) ([facility]) 08/04/2011  . HTN (hypertension) 08/04/2011  . Obesity 08/04/2011   Past Medical History:  Diagnosis Date  . Anemia    iron deficiency, due to fibroids  . Arthritis   . Bronchial asthma   . Diastolic dysfunction    Grade 1 per echo Oct 2012; EF is normal.  . Fibroid, uterus    with menorrhagia  . GERD (gastroesophageal reflux disease)   . Hypertension   . Hypothyroidism   .  Motion sickness   . Obesity   . PVC's (premature ventricular contractions)   . SVT (supraventricular tachycardia) (HCC)     Family History  Problem Relation Age of Onset  . Cancer Mother        Lung Ca second hand smoke  . Lung cancer Mother   . Breast cancer Mother 1  . Cancer Father        liver and prostate Ca  . Liver cancer Father   . Diabetes Sister     Past Surgical History:  Procedure Laterality Date  . CARPAL TUNNEL RELEASE    . CARPAL TUNNEL RELEASE  2008   right hand. wears  brace on left  . COLONOSCOPY WITH PROPOFOL N/A 09/05/2019    Procedure: COLONOSCOPY WITH PROPOFOL;  Surgeon: Lucilla Lame, MD;  Location: [place];  Service: Endoscopy;  Laterality: N/A;  . TONSILLECTOMY AND ADENOIDECTOMY    . TUBAL LIGATION     Social History   Occupational History  . Not on file  Tobacco Use  . Smoking status: Never Smoker  . Smokeless tobacco: Never Used  Substance and Sexual Activity  . Alcohol use: No  . Drug use: No  . Sexual activity: Yes

## 2020-02-25 ENCOUNTER — Ambulatory Visit: Payer: 59 | Attending: Internal Medicine

## 2020-02-25 DIAGNOSIS — Z23 Encounter for immunization: Secondary | ICD-10-CM

## 2020-02-25 NOTE — Progress Notes (Signed)
   Covid-19 Vaccination Clinic  Name:  Amy Petty    MRN: [id number] DOB: Mar 30, 1956  02/25/2020  [name] was observed post Covid-19 immunization for 15 minutes without incident. She was provided with Vaccine Information Sheet and instruction to access the V-Safe system.   [name] was instructed to call 911 with any severe reactions post vaccine: Marland Kitchen Difficulty breathing  . Swelling of face and throat  . A fast heartbeat  . A bad rash all over body  . Dizziness and weakness   Immunizations Administered    Name Date Dose VIS Date Route   Pfizer COVID-19 Vaccine 02/25/2020  8:41 AM 0.3 mL 10/21/2019 Intramuscular   Manufacturer: [place]   Lot: [id number]   [place]: [id number]

## 2020-03-20 ENCOUNTER — Ambulatory Visit: Payer: 59

## 2020-03-24 ENCOUNTER — Ambulatory Visit: Payer: 59 | Attending: Internal Medicine

## 2020-03-24 ENCOUNTER — Other Ambulatory Visit: Payer: Self-pay

## 2020-03-24 DIAGNOSIS — Z23 Encounter for immunization: Secondary | ICD-10-CM

## 2020-03-24 NOTE — Progress Notes (Signed)
   Covid-19 Vaccination Clinic  Name:  TISHEENA BRAMEL    MRN: [id number] DOB: Nov 27, 1955  03/24/2020  Ms. Bluth was observed post Covid-19 immunization for 15 minutes without incident. She was provided with Vaccine Information Sheet and instruction to access the V-Safe system.   Ms. Yochim was instructed to call 911 with any severe reactions post vaccine: Marland Kitchen Difficulty breathing  . Swelling of face and throat  . A fast heartbeat  . A bad rash all over body  . Dizziness and weakness   Immunizations Administered    Name Date Dose VIS Date Route   Pfizer COVID-19 Vaccine 03/24/2020 10:03 AM 0.3 mL 01/04/2019 Intramuscular   Manufacturer: [place]   Lot: [id number]   [place]: [id number]

## 2020-06-20 ENCOUNTER — Other Ambulatory Visit: Payer: Self-pay | Admitting: Internal Medicine

## 2020-06-20 ENCOUNTER — Ambulatory Visit: Payer: 59 | Admitting: Family Medicine

## 2020-06-20 ENCOUNTER — Other Ambulatory Visit: Payer: Self-pay

## 2020-06-20 ENCOUNTER — Encounter: Payer: Self-pay | Admitting: Family Medicine

## 2020-06-20 ENCOUNTER — Ambulatory Visit: Payer: Self-pay

## 2020-06-20 DIAGNOSIS — M25512 Pain in left shoulder: Secondary | ICD-10-CM

## 2020-06-20 DIAGNOSIS — G8929 Other chronic pain: Secondary | ICD-10-CM

## 2020-06-20 DIAGNOSIS — M5441 Lumbago with sciatica, right side: Secondary | ICD-10-CM

## 2020-06-20 MED ORDER — NABUMETONE 500 MG PO TABS: 500.0000 mg | ORAL_TABLET | Freq: Two times a day (BID) | ORAL | 3 refills | Status: DC | PRN

## 2020-06-20 MED ORDER — DICLOFENAC SODIUM 1 % EX GEL: 4.0000 g | Freq: Four times a day (QID) | CUTANEOUS | 6 refills | Status: DC | PRN

## 2020-06-20 NOTE — Progress Notes (Signed)
Office Visit Note   Patient: Amy Petty           Date of Birth: [birth year]           MRN: [id number] Visit Date: 06/20/2020 Requested by: Crecencio Mc, MD [address] [place],  [place] [address] PCP: Crecencio Mc, MD  Subjective: Chief Complaint  Patient presents with  . Left Shoulder - Pain    Pain in the upper arm/shoulder x 2 months. Reduced ROM due to pain. Wakes her at night. Has had this in the past - got better with a [place] cortisone injection.   . Lower Back - Pain    Right-sided low back pain with occasional shooting pain down the right leg - worse at night. This has been an issue for years. Has had numbness in the toes of the right foot, though, x 4 months (started with 1 toe & now involves all toes).    HPI: She is here with left shoulder and low back pain.  Shoulder started bothering her couple months ago with no injury.  Pain wakes her at night when she rolls over on her shoulder.  Pain seems to be toward the front of her shoulder but she has trouble pinpointing the location of her pain.  In the past she has had an ultrasound-guided injection with good results.  This was several years ago.  Her low back has been a chronic problem for her.  She had an MRI scan in 2017 which showed some degenerative changes but no obvious nerve impingement.  Lately for the past 4 months she has had numbness in all of her toes.  She was told she might have neuropathy.  She had some labs drawn which were unremarkable other than prediabetes.  She wondered whether her toe numbness might be related to her back since sometimes the pain from her back shoots down her right leg to her foot.               ROS:   All other systems were reviewed and are negative.  Objective: Vital Signs: LMP 11/23/2013   Physical Exam:  General:  Alert and oriented, in no acute distress. Pulm:  Breathing unlabored. Psy:  Normal mood, congruent affect.  Right shoulder: She has slightly diminished  active range of motion with external rotation and internal rotation, pain with AC crossover test and tenderness over the [facility] joint.  No significant biceps tenderness.  Rotator cuff strength is 5/5 throughout. Low back: She is tender near the right SI joint and in the sciatic notch.  Negative straight leg raise, lower extremity strength and reflexes are normal.  Light touch sensation in all of her toes is slightly diminished.   Imaging: XR Lumbar Spine 2-3 Views  Result Date: 06/20/2020 X-rays lumbar spine reveal diffuse degenerative disc disease with anterior and posterior as well as lateral spurring.  It is most pronounced at L3-4, L4-5 and L5-S1.  It has worsened since 2016 x-rays.  No sign of compression fracture or neoplasm.  XR Shoulder Left  Result Date: 06/20/2020 X-rays left shoulder show moderate AC joint DJD and very minimal glenohumeral DJD.  No soft tissue calcifications.  No other bony abnormality seen.   Assessment & Plan: 1.  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  AC injection if symptoms persist.  2.  Lumbar spondylosis, cannot rule out foraminal stenosis - Meds given, trial of chiropractic at [facility]  in [place].  - If no improvement, MRI scan.  If no source of toe numbness seen, then additional lab workup and nerve studies. - She'll slowly wean from prilosec if able.  May need a food elimination diet.     Procedures: No procedures performed  No notes on file     PMFS History: Patient Active Problem List   Diagnosis Date Noted  . Ulcerative nasal mucositis 11/29/2019  . Lab test positive for detection of COVID-19 virus 11/19/2019  . Special screening for malignant neoplasms, colon   . Peripheral neuropathy 11/27/2018  . Post-menopausal bleeding 06/30/2018  . Prediabetes 06/30/2018  . Asthmatic bronchitis with acute exacerbation 02/14/2018  . Hyperlipidemia LDL goal <100 10/05/2016  . Low back pain 09/06/2015   . Encounter for general adult medical examination with abnormal findings 08/11/2012  . Screening for cervical cancer 10/20/2011  . Screening for breast cancer 10/20/2011  . Screening for colon cancer 10/20/2011  . Allergic rhinitis 10/20/2011  . Fibroid, uterus   . SVT (supraventricular tachycardia) ([place]) 08/04/2011  . HTN (hypertension) 08/04/2011  . Obesity 08/04/2011   Past Medical History:  Diagnosis Date  . Anemia    iron deficiency, due to fibroids  . Arthritis   . Bronchial asthma   . Diastolic dysfunction    Grade 1 per echo Oct 2012; EF is normal.  . Fibroid, uterus    with menorrhagia  . GERD (gastroesophageal reflux disease)   . Hypertension   . Hypothyroidism   . Motion sickness   . Obesity   . PVC's (premature ventricular contractions)   . SVT (supraventricular tachycardia) (HCC)     Family History  Problem Relation Age of Onset  . Cancer Mother        Lung Ca second hand smoke  . Lung cancer Mother   . Breast cancer Mother 54  . Cancer Father        liver and prostate Ca  . Liver cancer Father   . Diabetes Sister     Past Surgical History:  Procedure Laterality Date  . CARPAL TUNNEL RELEASE    . CARPAL TUNNEL RELEASE  2008   right hand. wears  brace on left  . COLONOSCOPY WITH PROPOFOL N/A 09/05/2019   Procedure: COLONOSCOPY WITH PROPOFOL;  Surgeon: Lucilla Lame, MD;  Location: [facility];  Service: Endoscopy;  Laterality: N/A;  . TONSILLECTOMY AND ADENOIDECTOMY    . TUBAL LIGATION     Social History   Occupational History  . Not on file  Tobacco Use  . Smoking status: Never Smoker  . Smokeless tobacco: Never Used  Vaping Use  . Vaping Use: Never used  Substance and Sexual Activity  . Alcohol use: No  . Drug use: No  . Sexual activity: Yes

## 2020-06-27 ENCOUNTER — Other Ambulatory Visit: Payer: Self-pay | Admitting: Internal Medicine

## 2020-07-07 ENCOUNTER — Other Ambulatory Visit: Payer: Self-pay | Admitting: Internal Medicine

## 2020-09-07 ENCOUNTER — Other Ambulatory Visit: Payer: Self-pay

## 2020-09-07 ENCOUNTER — Ambulatory Visit: Payer: Self-pay

## 2020-09-07 ENCOUNTER — Ambulatory Visit: Payer: 59 | Admitting: Family Medicine

## 2020-09-07 ENCOUNTER — Encounter: Payer: Self-pay | Admitting: Family Medicine

## 2020-09-07 DIAGNOSIS — M25562 Pain in left knee: Secondary | ICD-10-CM | POA: Diagnosis not present

## 2020-09-07 DIAGNOSIS — M25512 Pain in left shoulder: Secondary | ICD-10-CM | POA: Diagnosis not present

## 2020-09-07 NOTE — Progress Notes (Signed)
Office Visit Note   Patient: Amy Petty           Date of Birth: 09-Mar-1956           MRN: [id number] Visit Date: 09/07/2020 Requested by: Crecencio Mc, MD [address] [place],  [place] [address] PCP: Crecencio Mc, MD  Subjective: Chief Complaint  Patient presents with  . Left Shoulder - Pain    Pain in the upper arm and top of shoulder, when she raises her arm. Went to chiropractor - helped her neck.  . Left Knee - Pain    Knee has continued to hurt, but since yesterday she has had pain in the medial and posterior knee. She is wondering if there is fluid in the Baker's cyst again.    HPI: She is here with persistent left shoulder and knee pain.  Shoulder hurts constantly, very difficult to reach overhead and behind the back.  Deep ache in the shoulder.  Her knee continues to bother her as well.  Pain mostly on the medial aspect but occasionally on the posterior aspect.  Injection in March helped some, but not a lot.  X-rays from 2019 show mild to moderate medial compartment narrowing and periarticular spurring.                ROS:   All other systems were reviewed and are negative.  Objective: Vital Signs: LMP 11/23/2013   Physical Exam:  General:  Alert and oriented, in no acute distress. Pulm:  Breathing unlabored. Psy:  Normal mood, congruent affect.  Left shoulder: She has adhesive capsulitis compared to the right.  There is pain at the extreme of abduction, internal and external rotation.  She does have some AC joint tenderness, but crossover test does not reproduce her pain. Left knee: Trace effusion with no warmth.  Tender on the medial joint line.  1+ laxity with valgus stress, still a solid endpoint.  Minimal tenderness in the popliteal fossa.    Imaging: US Guided Needle Placement - No Linked Charges  Result Date: 09/07/2020 Limited diagnostic ultrasound of the left posterior knee reveals a small popliteal cyst which is not tender to direct  palpation. Ultrasound guided injection is preferred based studies that show increased duration, increased effect, greater accuracy, decreased procedural pain, increased response rate, and decreased cost with ultrasound guided versus blind injection.   Verbal informed consent obtained.  Time-out conducted.  Noted no overlying erythema, induration, or other signs of local infection. Ultrasound-guided left glenohumeral injection: After sterile prep with Betadine, injected 8 cc 1% lidocaine without epinephrine and 40 mg methylprednisolone using a 22-gauge spinal needle, passing the needle from posterior approach into the glenohumeral joint.  Injectate was seen filling the joint capsule.  Excellent immediate relief.    Assessment & Plan: 1.  Chronic left shoulder pain, possibly adhesive capsulitis versus glenohumeral DJD. -Glenohumeral injection given today as above.  Follow-up as needed.  2.  Left knee pain with medial compartment DJD -Discussed options and elected to inject with dextrose today.  Can repeat if needed.  Consider medial compartment unloading brace if symptoms persist.     Procedures: Left knee injection: After sterile prep with Betadine, injected 6 cc 1% lidocaine without epinephrine and 4 cc 50% dextrose from medial midpatellar approach.    PMFS History: Patient Active Problem List   Diagnosis Date Noted  . Ulcerative nasal mucositis 11/29/2019  . Lab test positive for detection of COVID-19 virus 11/19/2019  .  Special screening for malignant neoplasms, colon   . Peripheral neuropathy 11/27/2018  . Post-menopausal bleeding 06/30/2018  . Prediabetes 06/30/2018  . Asthmatic bronchitis with acute exacerbation 02/14/2018  . Hyperlipidemia LDL goal <100 10/05/2016  . Low back pain 09/06/2015  . Encounter for general adult medical examination with abnormal findings 08/11/2012  . Screening for cervical cancer 10/20/2011  . Screening for breast cancer 10/20/2011  . Screening for  colon cancer 10/20/2011  . Allergic rhinitis 10/20/2011  . Fibroid, uterus   . SVT (supraventricular tachycardia) ([facility]) 08/04/2011  . HTN (hypertension) 08/04/2011  . Obesity 08/04/2011   Past Medical History:  Diagnosis Date  . Anemia    iron deficiency, due to fibroids  . Arthritis   . Bronchial asthma   . Diastolic dysfunction    Grade 1 per echo Oct 2012; EF is normal.  . Fibroid, uterus    with menorrhagia  . GERD (gastroesophageal reflux disease)   . Hypertension   . Hypothyroidism   . Motion sickness   . Obesity   . PVC's (premature ventricular contractions)   . SVT (supraventricular tachycardia) (HCC)     Family History  Problem Relation Age of Onset  . Cancer Mother        Lung Ca second hand smoke  . Lung cancer Mother   . Breast cancer Mother 71  . Cancer Father        liver and prostate Ca  . Liver cancer Father   . Diabetes Sister     Past Surgical History:  Procedure Laterality Date  . CARPAL TUNNEL RELEASE    . CARPAL TUNNEL RELEASE  2008   right hand. wears  brace on left  . COLONOSCOPY WITH PROPOFOL N/A 09/05/2019   Procedure: COLONOSCOPY WITH PROPOFOL;  Surgeon: Lucilla Lame, MD;  Location: [facility];  Service: Endoscopy;  Laterality: N/A;  . TONSILLECTOMY AND ADENOIDECTOMY    . TUBAL LIGATION     Social History   Occupational History  . Not on file  Tobacco Use  . Smoking status: Never Smoker  . Smokeless tobacco: Never Used  Vaping Use  . Vaping Use: Never used  Substance and Sexual Activity  . Alcohol use: No  . Drug use: No  . Sexual activity: Yes

## 2020-09-08 ENCOUNTER — Encounter: Payer: Self-pay | Admitting: Family Medicine

## 2020-12-11 ENCOUNTER — Other Ambulatory Visit: Payer: Self-pay | Admitting: Internal Medicine

## 2021-01-02 ENCOUNTER — Other Ambulatory Visit: Payer: Self-pay | Admitting: Internal Medicine

## 2021-03-18 ENCOUNTER — Other Ambulatory Visit: Payer: Self-pay

## 2021-03-18 MED ORDER — AMLODIPINE BESYLATE 2.5 MG PO TABS: 2.5000 mg | ORAL_TABLET | Freq: Every day | ORAL | 0 refills | Status: DC

## 2021-03-18 MED ORDER — METOPROLOL SUCCINATE ER 100 MG PO TB24: ORAL_TABLET | ORAL | 0 refills | Status: DC

## 2021-03-18 MED ORDER — SPIRONOLACTONE 50 MG PO TABS: 50.0000 mg | ORAL_TABLET | Freq: Every day | ORAL | 0 refills | Status: DC

## 2021-04-03 ENCOUNTER — Other Ambulatory Visit: Payer: Self-pay | Admitting: Internal Medicine

## 2021-05-24 ENCOUNTER — Other Ambulatory Visit: Payer: Self-pay | Admitting: Internal Medicine

## 2021-06-13 ENCOUNTER — Other Ambulatory Visit: Payer: Self-pay

## 2021-06-13 ENCOUNTER — Encounter: Payer: Self-pay | Admitting: Family Medicine

## 2021-06-13 ENCOUNTER — Ambulatory Visit: Payer: 59 | Admitting: Family Medicine

## 2021-06-13 DIAGNOSIS — M5441 Lumbago with sciatica, right side: Secondary | ICD-10-CM

## 2021-06-13 DIAGNOSIS — G8929 Other chronic pain: Secondary | ICD-10-CM

## 2021-06-13 MED ORDER — BACLOFEN 10 MG PO TABS: 5.0000 mg | ORAL_TABLET | Freq: Three times a day (TID) | ORAL | 3 refills | Status: DC | PRN

## 2021-06-13 NOTE — Progress Notes (Signed)
Office Visit Note   Patient: Amy Petty           Date of Birth: 1956-05-02           MRN: [id number] Visit Date: 06/13/2021 Requested by: Crecencio Mc, MD [place],  [place] [address] PCP: Crecencio Mc, MD  Subjective: Chief Complaint  Patient presents with   Lower Back - Pain    Chiropractic therapy only helped temporarily. Numbness in the feet/toes (just GT on the left foot). Pain with sitting and standing. Lying down is the best position. Taking 2 Tylenol bid - works better than the Tramadol, but the pain is still there.    HPI: He is here with persistent low back pain with right-sided sciatica.  Chiropractic helped temporarily but did not give any long-term relief.  Now the numbness and tingling in her foot seems to be getting worse.  She has good strength in her leg, no noticeable weakness or instability.  Denies any bowel or bladder dysfunction.  Pain is better when lying down but it takes about an hour before she gets relief.               ROS:   All other systems were reviewed and are negative.  Objective: Vital Signs: LMP 11/23/2013   Physical Exam:  General:  Alert and oriented, in no acute distress. Pulm:  Breathing unlabored. Psy:  Normal mood, congruent affect.  Low back: She is tender in the right posterior hip.  She has negative straight leg raise.  No pain with internal hip rotation.  Lower extremity strength and reflexes remain normal.    Imaging: No results found.  Assessment & Plan: Chronic low back pain with right-sided sciatica -Discussed options, elected to proceed with a new MRI scan followed by epidural steroid injection if indicated.  Baclofen as needed.     Procedures: No procedures performed        PMFS History: Patient Active Problem List   Diagnosis Date Noted   Ulcerative nasal mucositis 11/29/2019   Lab test positive for detection of COVID-19 virus 11/19/2019   Special screening for malignant  neoplasms, colon    Peripheral neuropathy 11/27/2018   Post-menopausal bleeding 06/30/2018   Prediabetes 06/30/2018   Asthmatic bronchitis with acute exacerbation 02/14/2018   Hyperlipidemia LDL goal <100 10/05/2016   Low back pain 09/06/2015   Encounter for general adult medical examination with abnormal findings 08/11/2012   Screening for cervical cancer 10/20/2011   Screening for breast cancer 10/20/2011   Screening for colon cancer 10/20/2011   Allergic rhinitis 10/20/2011   Fibroid, uterus    SVT (supraventricular tachycardia) ([place]) 08/04/2011   HTN (hypertension) 08/04/2011   Obesity 08/04/2011   Past Medical History:  Diagnosis Date   Anemia    iron deficiency, due to fibroids   Arthritis    Bronchial asthma    Diastolic dysfunction    Grade 1 per echo Oct 2012; EF is normal.   Fibroid, uterus    with menorrhagia   GERD (gastroesophageal reflux disease)    Hypertension    Hypothyroidism    Motion sickness    Obesity    PVC's (premature ventricular contractions)    SVT (supraventricular tachycardia) ([facility])     Family History  Problem Relation Age of Onset   Cancer Mother        Lung Ca second hand smoke   Lung cancer Mother    Breast cancer Mother 109  Cancer Father        liver and prostate Ca   Liver cancer Father    Diabetes Sister     Past Surgical History:  Procedure Laterality Date   CARPAL TUNNEL RELEASE     CARPAL TUNNEL RELEASE  2008   right hand. wears  brace on left   COLONOSCOPY WITH PROPOFOL N/A 09/05/2019   Procedure: COLONOSCOPY WITH PROPOFOL;  Surgeon: Lucilla Lame, MD;  Location: [facility];  Service: Endoscopy;  Laterality: N/A;   TONSILLECTOMY AND ADENOIDECTOMY     TUBAL LIGATION     Social History   Occupational History   Not on file  Tobacco Use   Smoking status: Never   Smokeless tobacco: Never  Vaping Use   Vaping Use: Never used  Substance and Sexual Activity   Alcohol use: No   Drug use: No   Sexual activity:  Yes

## 2021-06-14 ENCOUNTER — Other Ambulatory Visit: Payer: Self-pay | Admitting: Internal Medicine

## 2021-06-21 ENCOUNTER — Ambulatory Visit
Admission: RE | Admit: 2021-06-21 | Discharge: 2021-06-21 | Disposition: A | Discharge status: Home / Self Care | Payer: 59 | Source: Ambulatory Visit | Attending: Family Medicine | Admitting: Family Medicine

## 2021-06-21 ENCOUNTER — Other Ambulatory Visit: Payer: Self-pay

## 2021-06-21 DIAGNOSIS — G8929 Other chronic pain: Secondary | ICD-10-CM

## 2021-06-24 ENCOUNTER — Telehealth: Payer: Self-pay | Admitting: Family Medicine

## 2021-06-24 ENCOUNTER — Other Ambulatory Visit: Payer: Self-pay | Admitting: Family Medicine

## 2021-06-24 DIAGNOSIS — M5441 Lumbago with sciatica, right side: Secondary | ICD-10-CM

## 2021-06-24 DIAGNOSIS — G8929 Other chronic pain: Secondary | ICD-10-CM

## 2021-06-24 NOTE — Telephone Encounter (Signed)
Lumbar MRI scan shows moderate narrowing of the left-sided nerve opening at L3-4.  There is moderate right-sided narrowing at L4-5.  There is moderate left-sided narrowing at L5-S1.  Given that the symptoms are primarily right-sided, I suspect that the L4-5 level is been most likely source.  We can proceed with injections if desired.

## 2021-06-26 ENCOUNTER — Ambulatory Visit
Admission: RE | Admit: 2021-06-26 | Discharge: 2021-06-26 | Disposition: A | Discharge status: Home / Self Care | Payer: 59 | Source: Ambulatory Visit | Attending: Family Medicine | Admitting: Family Medicine

## 2021-06-26 DIAGNOSIS — G8929 Other chronic pain: Secondary | ICD-10-CM

## 2021-06-26 DIAGNOSIS — M5441 Lumbago with sciatica, right side: Secondary | ICD-10-CM

## 2021-06-26 MED ORDER — IOPAMIDOL (ISOVUE-M 200) INJECTION 41%: 1.0000 mL | Freq: Once | INTRAMUSCULAR | Status: DC

## 2021-06-26 MED ORDER — METHYLPREDNISOLONE ACETATE 40 MG/ML INJ SUSP (RADIOLOG: 80.0000 mg | Freq: Once | INTRAMUSCULAR | Status: DC

## 2021-06-26 NOTE — Discharge Instructions (Signed)

## 2021-09-11 ENCOUNTER — Encounter: Payer: Self-pay | Admitting: Physician Assistant

## 2021-09-11 ENCOUNTER — Ambulatory Visit: Payer: BC Managed Care – PPO | Admitting: Physician Assistant

## 2021-09-11 ENCOUNTER — Ambulatory Visit: Payer: Self-pay

## 2021-09-11 DIAGNOSIS — G8929 Other chronic pain: Secondary | ICD-10-CM

## 2021-09-11 DIAGNOSIS — M25561 Pain in right knee: Secondary | ICD-10-CM | POA: Diagnosis not present

## 2021-09-11 DIAGNOSIS — S8002XA Contusion of left knee, initial encounter: Secondary | ICD-10-CM

## 2021-09-11 DIAGNOSIS — M5441 Lumbago with sciatica, right side: Secondary | ICD-10-CM

## 2021-09-11 MED ORDER — DOXYCYCLINE HYCLATE 100 MG PO TABS: 100.0000 mg | ORAL_TABLET | Freq: Two times a day (BID) | ORAL | 0 refills | Status: AC

## 2021-09-11 MED ORDER — DICLOFENAC SODIUM 75 MG PO TBEC: 75.0000 mg | DELAYED_RELEASE_TABLET | Freq: Two times a day (BID) | ORAL | 1 refills | Status: DC

## 2021-09-11 MED ORDER — CYCLOBENZAPRINE HCL 10 MG PO TABS: 10.0000 mg | ORAL_TABLET | Freq: Three times a day (TID) | ORAL | 0 refills | Status: DC | PRN

## 2021-09-11 NOTE — Progress Notes (Signed)
Office Visit Note   Patient: Amy Petty           Date of Birth: 09/24/1956           MRN: [id number] Visit Date: 09/11/2021              Requested by: Crecencio Mc, MD [address] [place],  [place] [address] PCP: Crecencio Mc, MD   Assessment & Plan: Visit Diagnoses:  1. Acute pain of right knee   2. Traumatic hematoma of left knee, initial encounter     Plan: Place her on Flexeril take primarily at night for her back also we will also have her stop all NSAIDs and begin taking oral diclofenac.  She is placed on doxycycline due to the hematoma I&D.  We will see her back in 1 week to check the left knee wound.  Questions were encouraged and answered.  We will also set her up for an epidural steroid injection with Dr. Ernestina Patches possibly an L4-5 foraminal injection.  Questions were encouraged and answered at length.  Wound was dressed with Xeroform, 4 x 4's ABD and Ace bandage which she will leave on for 2 days and then she can remove and get wet in the shower trying to completely remove it and applying a Band-Aid.  Follow-Up Instructions: Return in about 1 week (around 09/18/2021).   Orders:  Orders Placed This Encounter  Procedures   Incision & Drainage   XR Knee 1-2 Views Right   Meds ordered this encounter  Medications   diclofenac (VOLTAREN) 75 MG EC tablet    Sig: Take 1 tablet (75 mg total) by mouth 2 (two) times daily.    Dispense:  60 tablet    Refill:  1   cyclobenzaprine (FLEXERIL) 10 MG tablet    Sig: Take 1 tablet (10 mg total) by mouth 3 (three) times daily as needed for muscle spasms.    Dispense:  30 tablet    Refill:  0   doxycycline (VIBRA-TABS) 100 MG tablet    Sig: Take 1 tablet (100 mg total) by mouth 2 (two) times daily for 7 days.    Dispense:  14 tablet    Refill:  0      Procedures: Incision & Drainage  Date/Time: 09/11/2021 5:20 PM Performed by: Pete Pelt, PA-C Authorized by: Pete Pelt, PA-C   Consent:     Consent given by:  Patient   Risks, benefits, and alternatives were discussed: yes     Risks discussed:  Infection   Alternatives discussed:  No treatment Location:    Type:  Hematoma   Location:  Lower extremity   Lower extremity location:  Knee   Knee location:  L knee Pre-procedure details:    Skin preparation:  Povidone-iodine Anesthesia:    Anesthesia method:  Local infiltration   Local anesthetic:  Lidocaine 1% w/o epi Procedure type:    Complexity:  Simple Procedure details:    Needle aspiration: no     Incision types:  Stab incision   Incision depth:  Dermal   Wound management:  Debrided   Drainage:  Serosanguinous (Hematoma)   Drainage amount:  Moderate   Wound treatment: Closed with 3-0 nylon 2 stitches. Post-procedure details:    Procedure completion:  Tolerated well, no immediate complications   Clinical Data: No additional findings.   Subjective: Chief Complaint  Patient presents with   Right Knee - Pain    HPI Mrs. Salata  is a 65 year old female who has been seen by Dr. Malvin Johns in the past however is been sometime.  She most recently been seen by Dr. Junius Roads.  She is seen by Dr. Junius Roads for back pain.  She did undergo an MRI of the lumbar spine in August of this year which showed multilevel spondylosis progression.  L4-5 left foraminal stenosis moderate L4-5 right foraminal narrowing moderate and left foraminal stenosis at L4-5 S1 which was also moderate.  She was sent for an epidural steroid injection which was a right interlaminar injection at L4-5 on 06/26/2021 and states this helped for about 3 to 4 weeks. Currently she is having right knee pain which is worse with prolonged sitting like driving.  She describes the pain is posterior popliteal region and also in the lateral aspect of the knee.  She has had no new injury to the knee.  She does also state that she still has numbness in her feet that she associates with her back and this involves numbness in all of the  toes on the right foot and the left great toe.  Notes that pushing the gas while driving causes burning sensation lateral aspect of the left knee.  Pains been worse over the last 2 weeks. She also reports that she had a fall back in July and injured her left knee she incurred a laceration and wants this looked at states that it occasionally drains pus.  She had no fevers or chills.  She had no initial treatment for the laceration and is just been applying peroxide whenever it begins draining.  Review of Systems See HPI otherwise negative  Objective: Vital Signs: LMP 11/23/2013   Physical Exam General: Well-developed well-nourished female no acute distress mood and affect appropriate. Ortho Exam Lower extremity she has 5 out of 5 strength throughout the lower extremities except for flexion of the right leg against resistance which is 4 out of 5 strength.  Tight hamstrings bilaterally.  Negative straight leg raise bilaterally.  Tenderness right lower lumbar paraspinous region. Bilateral knees no abnormal warmth erythema or effusion.  There are 2 small areas of raised fluctuance cyst.  The medial cyst formation has a scab over it and is slightly larger than the lateral cyst.  Unable to express any purulence from the left knee. Specialty Comments:  No specialty comments available.  Imaging: XR Knee 1-2 Views Right  Result Date: 09/11/2021 Right knee 2 views: Knee is well located.  No acute fractures.  Slight narrowing medial joint line.  Spur at the quad insertion of the patella seen on the lateral view.    PMFS History: Patient Active Problem List   Diagnosis Date Noted   Ulcerative nasal mucositis 11/29/2019   Lab test positive for detection of COVID-19 virus 11/19/2019   Special screening for malignant neoplasms, colon    Peripheral neuropathy 11/27/2018   Post-menopausal bleeding 06/30/2018   Prediabetes 06/30/2018   Asthmatic bronchitis with acute exacerbation 02/14/2018    Hyperlipidemia LDL goal <100 10/05/2016   Low back pain 09/06/2015   Encounter for general adult medical examination with abnormal findings 08/11/2012   Screening for cervical cancer 10/20/2011   Screening for breast cancer 10/20/2011   Screening for colon cancer 10/20/2011   Allergic rhinitis 10/20/2011   Fibroid, uterus    SVT (supraventricular tachycardia) ([facility]) 08/04/2011   HTN (hypertension) 08/04/2011   Obesity 08/04/2011   Past Medical History:  Diagnosis Date   Anemia    iron deficiency, due to fibroids  Arthritis    Bronchial asthma    Diastolic dysfunction    Grade 1 per echo Oct 2012; EF is normal.   Fibroid, uterus    with menorrhagia   GERD (gastroesophageal reflux disease)    Hypertension    Hypothyroidism    Motion sickness    Obesity    PVC's (premature ventricular contractions)    SVT (supraventricular tachycardia) ([facility])     Family History  Problem Relation Age of Onset   Cancer Mother        Lung Ca second hand smoke   Lung cancer Mother    Breast cancer Mother 88   Cancer Father        liver and prostate Ca   Liver cancer Father    Diabetes Sister     Past Surgical History:  Procedure Laterality Date   CARPAL TUNNEL RELEASE     CARPAL TUNNEL RELEASE  2008   right hand. wears  brace on left   COLONOSCOPY WITH PROPOFOL N/A 09/05/2019   Procedure: COLONOSCOPY WITH PROPOFOL;  Surgeon: Lucilla Lame, MD;  Location: [facility];  Service: Endoscopy;  Laterality: N/A;   TONSILLECTOMY AND ADENOIDECTOMY     TUBAL LIGATION     Social History   Occupational History   Not on file  Tobacco Use   Smoking status: Never   Smokeless tobacco: Never  Vaping Use   Vaping Use: Never used  Substance and Sexual Activity   Alcohol use: No   Drug use: No   Sexual activity: Yes

## 2021-09-12 ENCOUNTER — Other Ambulatory Visit: Payer: Self-pay | Admitting: Internal Medicine

## 2021-09-16 NOTE — Addendum Note (Signed)
Addended by: Robyne Peers on: 09/16/2021 09:17 AM   Modules accepted: Orders

## 2021-09-18 ENCOUNTER — Ambulatory Visit (INDEPENDENT_AMBULATORY_CARE_PROVIDER_SITE_OTHER): Payer: BC Managed Care – PPO | Admitting: Physician Assistant

## 2021-09-18 ENCOUNTER — Other Ambulatory Visit: Payer: Self-pay

## 2021-09-18 ENCOUNTER — Encounter: Payer: Self-pay | Admitting: Physician Assistant

## 2021-09-18 DIAGNOSIS — M25562 Pain in left knee: Secondary | ICD-10-CM | POA: Diagnosis not present

## 2021-09-18 DIAGNOSIS — M5441 Lumbago with sciatica, right side: Secondary | ICD-10-CM

## 2021-09-18 DIAGNOSIS — G8929 Other chronic pain: Secondary | ICD-10-CM

## 2021-09-18 NOTE — Progress Notes (Signed)
HPI: Amy Petty returns today for wound check of her left knee.  She states the knee feels better since undergoing the I&D here in the office.  She has had no fevers chills.  She also notes that the oral diclofenac is helping with her knee pain bilaterally.  Physical exam: Left knee no gross infection.  The 2 fluctuant cyst areas are no longer present.  There is a small area of serosanguineous drainage from the medial cyst region.  The area of where to nylon sutures were placed is well-healed.  Sutures were removed today.  Impression: Left knee hematoma  Plan: She will apply Xeroform over the left knee wound and then apply gauze with a Band-Aid over to  compress the area.  She will leave this on for 2 days and then change it once washing it with antibacterial soap then reapply compression dressing.  Supplies were given today.  See her back in 1 month to see how she is doing overall.  Questions were encouraged and answered

## 2021-09-19 ENCOUNTER — Other Ambulatory Visit: Payer: Self-pay | Admitting: Physician Assistant

## 2021-09-19 DIAGNOSIS — G8929 Other chronic pain: Secondary | ICD-10-CM

## 2021-09-19 DIAGNOSIS — M5441 Lumbago with sciatica, right side: Secondary | ICD-10-CM

## 2021-09-26 ENCOUNTER — Other Ambulatory Visit: Payer: Self-pay

## 2021-09-26 ENCOUNTER — Ambulatory Visit
Admission: RE | Admit: 2021-09-26 | Discharge: 2021-09-26 | Disposition: A | Discharge status: Home / Self Care | Payer: BC Managed Care – PPO | Source: Ambulatory Visit | Attending: Physician Assistant | Admitting: Physician Assistant

## 2021-09-26 DIAGNOSIS — G8929 Other chronic pain: Secondary | ICD-10-CM

## 2021-09-26 MED ORDER — IOPAMIDOL (ISOVUE-M 200) INJECTION 41%
1.0000 mL | Freq: Once | INTRAMUSCULAR | Status: AC
  Administered 2021-09-26: 15:00:00 1 mL via EPIDURAL

## 2021-09-26 MED ORDER — METHYLPREDNISOLONE ACETATE 40 MG/ML INJ SUSP (RADIOLOG
80.0000 mg | Freq: Once | INTRAMUSCULAR | Status: AC
  Administered 2021-09-26: 15:00:00 80 mg via EPIDURAL

## 2021-09-26 NOTE — Discharge Instructions (Signed)

## 2021-09-29 ENCOUNTER — Other Ambulatory Visit: Payer: Self-pay

## 2021-09-29 ENCOUNTER — Emergency Department: Payer: BC Managed Care – PPO

## 2021-09-29 ENCOUNTER — Emergency Department
Admission: EM | Admit: 2021-09-29 | Discharge: 2021-09-29 | Disposition: A | Discharge status: Home / Self Care | Payer: BC Managed Care – PPO | Attending: Student in an Organized Health Care Education/Training Program | Admitting: Student in an Organized Health Care Education/Training Program

## 2021-09-29 DIAGNOSIS — W19XXXA Unspecified fall, initial encounter: Secondary | ICD-10-CM | POA: Insufficient documentation

## 2021-09-29 DIAGNOSIS — I10 Essential (primary) hypertension: Secondary | ICD-10-CM | POA: Insufficient documentation

## 2021-09-29 DIAGNOSIS — M545 Low back pain, unspecified: Secondary | ICD-10-CM | POA: Diagnosis not present

## 2021-09-29 DIAGNOSIS — Z8616 Personal history of COVID-19: Secondary | ICD-10-CM | POA: Insufficient documentation

## 2021-09-29 DIAGNOSIS — Z79899 Other long term (current) drug therapy: Secondary | ICD-10-CM | POA: Diagnosis not present

## 2021-09-29 DIAGNOSIS — M549 Dorsalgia, unspecified: Secondary | ICD-10-CM

## 2021-09-29 DIAGNOSIS — E039 Hypothyroidism, unspecified: Secondary | ICD-10-CM | POA: Diagnosis not present

## 2021-09-29 MED ORDER — HYDROCODONE-ACETAMINOPHEN 5-325 MG PO TABS
1.0000 | ORAL_TABLET | Freq: Once | ORAL | Status: AC
Start: 2021-09-29 — End: 2021-09-29
  Administered 2021-09-29: 1 via ORAL
  Filled 2021-09-29: qty 1

## 2021-09-29 MED ORDER — LIDOCAINE 5 % EX PTCH
1.0000 | MEDICATED_PATCH | CUTANEOUS | Status: DC
  Administered 2021-09-29: 1 via TRANSDERMAL
  Filled 2021-09-29: qty 1

## 2021-09-29 MED ORDER — KETOROLAC TROMETHAMINE 30 MG/ML IJ SOLN
30.0000 mg | Freq: Once | INTRAMUSCULAR | Status: AC
  Administered 2021-09-29: 30 mg via INTRAMUSCULAR
  Filled 2021-09-29: qty 1

## 2021-09-29 MED ORDER — HYDROCODONE-ACETAMINOPHEN 5-325 MG PO TABS: 1.0000 | ORAL_TABLET | ORAL | 0 refills | Status: DC | PRN

## 2021-09-29 MED ORDER — LIDOCAINE 5 % EX PTCH: 1.0000 | MEDICATED_PATCH | Freq: Two times a day (BID) | CUTANEOUS | 0 refills | Status: DC

## 2021-09-29 MED ORDER — CYCLOBENZAPRINE HCL 10 MG PO TABS
10.0000 mg | ORAL_TABLET | Freq: Once | ORAL | Status: AC
  Administered 2021-09-29: 10 mg via ORAL
  Filled 2021-09-29: qty 1

## 2021-09-29 NOTE — ED Provider Notes (Signed)
[facility] Emergency Department Provider Note    Event Date/Time   First MD Initiated Contact with Patient 09/29/21 1812     (approximate)  I have reviewed the triage vital signs and the nursing notes.   HISTORY  Chief Complaint Fall and Back Pain    HPI HAN LYSNE is a 65 y.o. female who presents to the ER for evaluation of low back pain that occurred after she had mechanical fall yesterday.  States that she was volunteering caring for tolerance.  States she was carrying a taller when another one ran in front of her she turned quickly to avoid injuring the toddler and fell backwards landing on her low back.  No head injury.  No numbness or tingling.  She did take a muscle relaxer prior to arriving.  Has a history of chronic back pain.  Denies any chest pain or shortness of breath no abdominal pain no vomiting.  Past Medical History:  Diagnosis Date   Anemia    iron deficiency, due to fibroids   Arthritis    Bronchial asthma    Diastolic dysfunction    Grade 1 per echo Oct 2012; EF is normal.   Fibroid, uterus    with menorrhagia   GERD (gastroesophageal reflux disease)    Hypertension    Hypothyroidism    Motion sickness    Obesity    PVC's (premature ventricular contractions)    SVT (supraventricular tachycardia) ([facility])    Family History  Problem Relation Age of Onset   Cancer Mother        Lung Ca second hand smoke   Lung cancer Mother    Breast cancer Mother 26   Cancer Father        liver and prostate Ca   Liver cancer Father    Diabetes Sister    Past Surgical History:  Procedure Laterality Date   CARPAL TUNNEL RELEASE     CARPAL TUNNEL RELEASE  2008   right hand. wears  brace on left   COLONOSCOPY WITH PROPOFOL N/A 09/05/2019   Procedure: COLONOSCOPY WITH PROPOFOL;  Surgeon: Lucilla Lame, MD;  Location: [facility];  Service: Endoscopy;  Laterality: N/A;   TONSILLECTOMY AND ADENOIDECTOMY     TUBAL LIGATION      Patient Active Problem List   Diagnosis Date Noted   Ulcerative nasal mucositis 11/29/2019   Lab test positive for detection of COVID-19 virus 11/19/2019   Special screening for malignant neoplasms, colon    Peripheral neuropathy 11/27/2018   Post-menopausal bleeding 06/30/2018   Prediabetes 06/30/2018   Asthmatic bronchitis with acute exacerbation 02/14/2018   Hyperlipidemia LDL goal <100 10/05/2016   Low back pain 09/06/2015   Encounter for general adult medical examination with abnormal findings 08/11/2012   Screening for cervical cancer 10/20/2011   Screening for breast cancer 10/20/2011   Screening for colon cancer 10/20/2011   Allergic rhinitis 10/20/2011   Fibroid, uterus    SVT (supraventricular tachycardia) ([facility]) 08/04/2011   HTN (hypertension) 08/04/2011   Obesity 08/04/2011      Prior to Admission medications   Medication Sig Start Date End Date Taking? Authorizing Provider  HYDROcodone-acetaminophen (NORCO) 5-325 MG tablet Take 1 tablet by mouth every 4 (four) hours as needed for moderate pain. 09/29/21  Yes Merlyn Lot, MD  lidocaine (LIDODERM) 5 % Place 1 patch onto the skin every 12 (twelve) hours. Remove & Discard patch within 12 hours or as directed by MD 09/29/21 09/29/22 Yes Quentin Cornwall,  Saralyn Pilar, MD  acetaminophen (TYLENOL) 650 MG CR tablet Take 650 mg by mouth every 8 (eight) hours.    [provider]  albuterol (PROVENTIL HFA;VENTOLIN HFA) 108 (90 Base) MCG/ACT inhaler Inhale 2 puffs into the lungs every 6 (six) hours as needed for wheezing or shortness of breath. 02/12/18   Crecencio Mc, MD  amLODipine (NORVASC) 2.5 MG tablet TAKE ONE TABLET BY MOUTH ONE TIME DAILY 09/12/21   Crecencio Mc, MD  cholecalciferol (VITAMIN D) 1000 units tablet Take 1,000 Units by mouth daily.    [provider]  cyclobenzaprine (FLEXERIL) 10 MG tablet Take 1 tablet (10 mg total) by mouth 3 (three) times daily as needed for muscle spasms. 09/11/21   Pete Pelt, PA-C  diclofenac (VOLTAREN) 75 MG EC tablet Take 1 tablet (75 mg total) by mouth 2 (two) times daily. 09/11/21   Pete Pelt, PA-C  Docosanol (ABREVA) 10 % CREA Apply twice daily to cold sores 11/29/19   Crecencio Mc, MD  ketoconazole (NIZORAL) 2 % shampoo WASH SCALP 3-4 TIMES PER WEEK. LET SIT SEVERAL MINUTES BEFORE RINSING 07/14/18   [provider]  Melatonin 2.5 MG CHEW Chew 1 tablet by mouth at bedtime.    [provider]  metoprolol succinate (TOPROL-XL) 100 MG 24 hr tablet TAKE ONE TABLET BY MOUTH ONE TIME DAILY WITH FOOD OR IMMEDIATELY AFTER A MEAL 09/12/21   Crecencio Mc, MD  montelukast (SINGULAIR) 10 MG tablet TAKE ONE TABLET BY MOUTH AT BEDTIME 09/12/21   Crecencio Mc, MD  Multiple Vitamin (MULTIVITAMIN) tablet Take 1 tablet by mouth daily.      [provider]  mupirocin ointment (BACTROBAN) 2 % PLACE 1 APPLICATION INTO THE NOSE 2 (TWO) TIMES DAILY 11/09/19   Crecencio Mc, MD  nabumetone (RELAFEN) 500 MG tablet Take 1 tablet (500 mg total) by mouth 2 (two) times daily as needed. Patient not taking: Reported on 06/13/2021 06/20/20   Hilts, Legrand Como, MD  naproxen sodium (ALEVE) 220 MG tablet Take 220 mg by mouth 2 (two) times daily as needed. Patient not taking: Reported on 06/13/2021    [provider]  omeprazole (PRILOSEC) 40 MG capsule TAKE 1 CAPSULE BY MOUTH EVERY DAY 01/02/21   Crecencio Mc, MD  spironolactone (ALDACTONE) 50 MG tablet TAKE ONE TABLET BY MOUTH ONE TIME DAILY 09/12/21   Crecencio Mc, MD  SYMBICORT 80-4.5 MCG/ACT inhaler 1 puff 2 (two) times daily. 11/17/19   [provider]  traMADol (ULTRAM) 50 MG tablet Take 1 tablet (50 mg total) by mouth every 8 (eight) hours as needed. Patient not taking: Reported on 06/13/2021 11/24/18   Crecencio Mc, MD  triamcinolone (NASACORT ALLERGY 24HR) 55 MCG/ACT AERO nasal inhaler Place 2 sprays into the nose daily.    [provider]    Allergies Gabapentin,  Penicillins, and Sulfonamide derivatives    Social History Social History   Tobacco Use   Smoking status: Never   Smokeless tobacco: Never  Vaping Use   Vaping Use: Never used  Substance Use Topics   Alcohol use: No   Drug use: No    Review of Systems Patient denies headaches, rhinorrhea, blurry vision, numbness, shortness of breath, chest pain, edema, cough, abdominal pain, nausea, vomiting, diarrhea, dysuria, fevers, rashes or hallucinations unless otherwise stated above in HPI. ____________________________________________   PHYSICAL EXAM:  VITAL SIGNS: Vitals:   09/29/21 1500  BP: (!) 147/87  Pulse: 81  Resp: 17  Temp: 98 F (36.7 C)  SpO2: 95%    Constitutional: Alert and oriented. Well appearing and in no acute distress. Eyes: Conjunctivae are normal.  Head: Atraumatic. Nose: No congestion/rhinnorhea. Mouth/Throat: Mucous membranes are moist.   Neck: Painless ROM.  Cardiovascular:   Good peripheral circulation. Respiratory: Normal respiratory effort.  No retractions.  Gastrointestinal: Soft and nontender.  Musculoskeletal: ttp of low back. No lower extremity tenderness .  No joint effusions. Neurologic:  Normal speech and language. No gross focal neurologic deficits are appreciated.  Skin:  Skin is warm, dry and intact. No rash noted. Psychiatric: Mood and affect are normal. Speech and behavior are normal.  ____________________________________________   LABS (all labs ordered are listed, but only abnormal results are displayed)  No results found for this or any previous visit (from the past 24 hour(s)). ____________________________________________ ____________________________________________  [id number]  I personally reviewed all radiographic images ordered to evaluate for the above acute complaints and reviewed radiology reports and findings.  These findings were personally discussed with the patient.  Please see medical record for radiology  report.  ____________________________________________   PROCEDURES  Procedure(s) performed:  Procedures    Critical Care performed: no ____________________________________________   INITIAL IMPRESSION / ASSESSMENT AND PLAN / ED COURSE  Pertinent labs & imaging results that were available during my care of the patient were reviewed by me and considered in my medical decision making (see chart for details).   DDX: fracture, contusion, dislocation, sciatica, lumbago  JANEANE COZART is a 65 y.o. who presents to the ED with low back pain as described above.  No neurodeficits or signs or symptoms to suggest cauda equina or spinal stenosis.  Lower suspicion for sciatica.  Suspect muscle strain.  X-rays were ordered to evaluate for fracture showed none.  She is quite point tender in the right paralumbar muscle would be consistent with lumbago.  Her abdominal exam is soft and benign.  She is on blood thinners and given the mechanism of action with stable vital signs have a very low suspicion for acute intra-abdominal injury discussed conservative management with patient and family.  Have ordered additional medication including Lidoderm muscle relaxer and Toradol.  We discussed strict return precautions.  Patient and family agreeable to plan.    The patient was evaluated in Emergency Department today for the symptoms described in the history of present illness. He/she was evaluated in the context of the global COVID-19 pandemic, which necessitated consideration that the patient might be at risk for infection with the SARS-CoV-2 virus that causes COVID-19. Institutional protocols and algorithms that pertain to the evaluation of patients at risk for COVID-19 are in a state of rapid change based on information released by regulatory bodies including the CDC and federal and state organizations. These policies and algorithms were followed during the patient's care in the  ED.   ____________________________________________   FINAL CLINICAL IMPRESSION(S) / ED DIAGNOSES  Final diagnoses:  Back pain      NEW MEDICATIONS STARTED DURING THIS VISIT:  New Prescriptions   HYDROCODONE-ACETAMINOPHEN (NORCO) 5-325 MG TABLET    Take 1 tablet by mouth every 4 (four) hours as needed for moderate pain.   LIDOCAINE (LIDODERM) 5 %    Place 1 patch onto the skin every 12 (twelve) hours. Remove & Discard patch within 12 hours or as directed by MD     Note:  This document was prepared using Dragon voice recognition software and may include unintentional dictation errors.     Quentin Cornwall,  Saralyn Pilar, MD 09/29/21 [id number]

## 2021-09-29 NOTE — ED Triage Notes (Signed)
Pt come with c/o right sided pain pain following fall ealier today at [employer]. Pt states she was carrying a kid and another kid ran in front of her causing her to fall. Pt states she fell on left side.   Pt states recent shot in spine for back pain.

## 2021-09-29 NOTE — ED Notes (Signed)
Dc ppw provided/ rx information and pain management. Pt denies questions at this time. Pt provides verbal consent for dc. Pt assistedoff unit in wheelchair

## 2021-09-29 NOTE — Discharge Instructions (Signed)

## 2021-10-10 ENCOUNTER — Encounter: Payer: Self-pay | Admitting: Physician Assistant

## 2021-10-10 ENCOUNTER — Telehealth: Payer: Self-pay

## 2021-10-10 ENCOUNTER — Ambulatory Visit (INDEPENDENT_AMBULATORY_CARE_PROVIDER_SITE_OTHER): Payer: BC Managed Care – PPO | Admitting: Physician Assistant

## 2021-10-10 DIAGNOSIS — S8002XA Contusion of left knee, initial encounter: Secondary | ICD-10-CM

## 2021-10-10 NOTE — Progress Notes (Signed)
HPI: Mrs. Myint comes in today due to to the left knee wound draining.  She notes that the wound is still smaller than what she had prior to the knee surgical excision of the area.  She had no fevers chills.  Left knee: Area of early redness tissue with slight serosanguineous drainage no signs of infection at the anterior aspect knee.  She is also developed a rash with perfect outline Band-Aid.  Impression: Left knee wound   Plan: I had Dr. Sharol Given recommendation today and grazes looks like early fibrinous tissue is not healing due to patient's venous insufficiency of the left leg.  Recommended Vive soft patient was measured by Dr. Sharol Given.  She will wear this daily taking and off for showering wash the area with antibacterial soap and put a clean sock on.  She will let us know in 1 month if the wound is healed or if she is continues to have drainage.  Questions were encouraged and answered at length.

## 2021-10-10 NOTE — Telephone Encounter (Signed)
Lvm advising pt we can work her in today @ 1pm

## 2021-10-16 ENCOUNTER — Ambulatory Visit: Payer: BC Managed Care – PPO | Admitting: Physician Assistant

## 2021-10-21 ENCOUNTER — Ambulatory Visit: Payer: BC Managed Care – PPO | Admitting: Physician Assistant

## 2021-12-11 ENCOUNTER — Other Ambulatory Visit: Payer: Self-pay | Admitting: Internal Medicine

## 2022-01-10 ENCOUNTER — Other Ambulatory Visit: Payer: Self-pay | Admitting: Internal Medicine

## 2022-03-11 ENCOUNTER — Telehealth: Payer: Self-pay | Admitting: Internal Medicine

## 2022-03-12 ENCOUNTER — Other Ambulatory Visit: Payer: Self-pay | Admitting: Internal Medicine

## 2022-03-12 ENCOUNTER — Other Ambulatory Visit: Payer: Self-pay

## 2022-03-12 DIAGNOSIS — Z1231 Encounter for screening mammogram for malignant neoplasm of breast: Secondary | ICD-10-CM

## 2022-03-12 MED ORDER — MONTELUKAST SODIUM 10 MG PO TABS: 10.0000 mg | ORAL_TABLET | Freq: Every day | ORAL | 0 refills | Status: DC

## 2022-03-12 MED ORDER — METOPROLOL SUCCINATE ER 100 MG PO TB24: ORAL_TABLET | ORAL | 0 refills | Status: DC

## 2022-03-12 MED ORDER — AMLODIPINE BESYLATE 2.5 MG PO TABS: 2.5000 mg | ORAL_TABLET | Freq: Every day | ORAL | 0 refills | Status: DC

## 2022-03-12 MED ORDER — SPIRONOLACTONE 50 MG PO TABS: 50.0000 mg | ORAL_TABLET | Freq: Every day | ORAL | 0 refills | Status: DC

## 2022-03-12 NOTE — Telephone Encounter (Signed)
Pt need refill on montelukast, spironolactone, amlodipine and metoprolol succinate sent [facility] ?

## 2022-03-12 NOTE — Addendum Note (Signed)
Addended by: Adair Laundry on: 03/12/2022 04:45 PM ? ? Modules accepted: Orders ? ?

## 2022-03-12 NOTE — Telephone Encounter (Signed)
All requested medications have been filled for 90 days only. Pt must keep her appt on 04/14/2022 for any further refills.  ?

## 2022-03-31 ENCOUNTER — Encounter: Payer: Self-pay | Admitting: Physician Assistant

## 2022-03-31 ENCOUNTER — Ambulatory Visit (INDEPENDENT_AMBULATORY_CARE_PROVIDER_SITE_OTHER): Payer: Medicare Other | Admitting: Physician Assistant

## 2022-03-31 DIAGNOSIS — M5441 Lumbago with sciatica, right side: Secondary | ICD-10-CM | POA: Diagnosis not present

## 2022-03-31 DIAGNOSIS — G8929 Other chronic pain: Secondary | ICD-10-CM | POA: Diagnosis not present

## 2022-03-31 MED ORDER — METHYLPREDNISOLONE 4 MG PO TABS: ORAL_TABLET | ORAL | 0 refills | Status: DC

## 2022-03-31 NOTE — Progress Notes (Signed)
Office Visit Note   Patient: Amy Petty           Date of Birth: 1956-06-10           MRN: [id number] Visit Date: 03/31/2022              Requested by: Crecencio Mc, MD [address] [place],  [place] [address] PCP: Crecencio Mc, MD   Assessment & Plan: Visit Diagnoses:  1. Chronic right-sided low back pain with right-sided sciatica     Plan: Place her on a Medrol Dosepak and she is nondiabetic.  In regards to the fact that she has weakness with right hip flexion and is noting some foot drop at times along with waking pain.  We will low back recommend repeat MRI to rule out HNP as a source of her radicular symptoms and weakness.  Per her request we will call her after the MRI to discuss the results.  Questions were encouraged and answered.  She was given a handicap permit.  Follow-Up Instructions: Return After MRI.   Orders:  No orders of the defined types were placed in this encounter.  Meds ordered this encounter  Medications   methylPREDNISolone (MEDROL) 4 MG tablet    Sig: Take as directed    Dispense:  21 tablet    Refill:  0      Procedures: No procedures performed   Clinical Data: No additional findings.   Subjective: Chief Complaint  Patient presents with   Lower Back - Pain, Follow-up   Right Knee - Pain, Follow-up    HPI Amy Petty comes in today for low back pain with radicular symptoms down her right leg.  She was last given an epidural steroid injection this was in the right L4 nerve block with a transforaminal epidural injection in November 2022 this gave her about 40% relief.  Since remained pretty stable until spring and now her pain is becoming worse.  She states she has to think about bringing her right foot up due to the fact that she has problems with lifting it.  Notes her back pain is 8 out of 10 at worst.  Does awaken her.  Denies any fevers chills bowel or bladder dysfunction or saddle anesthesia like symptoms.  She is taking  Aleve and Tylenol.  She notes numbness in her right foot tingling right lateral thigh.  Some numbness in her left great toe. Prior MRI 06/21/2021 showed progressive moderate left foraminal stenosis at L3-4 moderate right foraminal narrowing at L4-5 and moderate left foraminal stenosis at L5-S1.  Review of Systems See HPI  Objective: Vital Signs: LMP 11/23/2013   Physical Exam Constitutional:      Appearance: She is not ill-appearing or diaphoretic.  Cardiovascular:     Pulses: Normal pulses.  Pulmonary:     Effort: Pulmonary effort is normal.  Neurological:     Mental Status: She is alert and oriented to person, place, and time.  Psychiatric:        Mood and Affect: Mood normal.    Ortho Exam Lower extremities 5 out of 5 strength throughout except for right hip flexion which is 4 out of 5.  She has full dorsiflexion plantarflexion bilateral ankles while seated.  Sensation grossly intact bilateral feet.  Positive straight leg raise on the right negative on the left.  Specialty Comments:  No specialty comments available.  Imaging: No results found.   PMFS History: Patient Active Problem List  Diagnosis Date Noted   Ulcerative nasal mucositis 11/29/2019   Lab test positive for detection of COVID-19 virus 11/19/2019   Special screening for malignant neoplasms, colon    Peripheral neuropathy 11/27/2018   Post-menopausal bleeding 06/30/2018   Prediabetes 06/30/2018   Asthmatic bronchitis with acute exacerbation 02/14/2018   Hyperlipidemia LDL goal <100 10/05/2016   Low back pain 09/06/2015   Encounter for general adult medical examination with abnormal findings 08/11/2012   Screening for cervical cancer 10/20/2011   Screening for breast cancer 10/20/2011   Screening for colon cancer 10/20/2011   Allergic rhinitis 10/20/2011   Fibroid, uterus    SVT (supraventricular tachycardia) ([place]) 08/04/2011   HTN (hypertension) 08/04/2011   Obesity 08/04/2011   Past Medical  History:  Diagnosis Date   Anemia    iron deficiency, due to fibroids   Arthritis    Bronchial asthma    Diastolic dysfunction    Grade 1 per echo Oct 2012; EF is normal.   Fibroid, uterus    with menorrhagia   GERD (gastroesophageal reflux disease)    Hypertension    Hypothyroidism    Motion sickness    Obesity    PVC's (premature ventricular contractions)    SVT (supraventricular tachycardia) ([place])     Family History  Problem Relation Age of Onset   Cancer Mother        Lung Ca second hand smoke   Lung cancer Mother    Breast cancer Mother 3   Cancer Father        liver and prostate Ca   Liver cancer Father    Diabetes Sister     Past Surgical History:  Procedure Laterality Date   CARPAL TUNNEL RELEASE     CARPAL TUNNEL RELEASE  2008   right hand. wears  brace on left   COLONOSCOPY WITH PROPOFOL N/A 09/05/2019   Procedure: COLONOSCOPY WITH PROPOFOL;  Surgeon: Lucilla Lame, MD;  Location: [facility];  Service: Endoscopy;  Laterality: N/A;   TONSILLECTOMY AND ADENOIDECTOMY     TUBAL LIGATION     Social History   Occupational History   Not on file  Tobacco Use   Smoking status: Never   Smokeless tobacco: Never  Vaping Use   Vaping Use: Never used  Substance and Sexual Activity   Alcohol use: No   Drug use: No   Sexual activity: Yes

## 2022-04-01 NOTE — Addendum Note (Signed)
Addended by: Robyne Peers on: 04/01/2022 11:38 AM   Modules accepted: Orders

## 2022-04-09 ENCOUNTER — Other Ambulatory Visit: Payer: BC Managed Care – PPO

## 2022-04-10 ENCOUNTER — Ambulatory Visit
Admission: RE | Admit: 2022-04-10 | Discharge: 2022-04-10 | Disposition: A | Discharge status: Home / Self Care | Payer: Medicare Other | Source: Ambulatory Visit | Attending: Internal Medicine | Admitting: Internal Medicine

## 2022-04-10 DIAGNOSIS — Z1231 Encounter for screening mammogram for malignant neoplasm of breast: Secondary | ICD-10-CM | POA: Insufficient documentation

## 2022-04-14 ENCOUNTER — Other Ambulatory Visit (HOSPITAL_COMMUNITY)
Admission: RE | Admit: 2022-04-14 | Discharge: 2022-04-14 | Disposition: A | Discharge status: Home / Self Care | Payer: Medicare Other | Source: Ambulatory Visit | Attending: Internal Medicine | Admitting: Internal Medicine

## 2022-04-14 ENCOUNTER — Encounter: Payer: Self-pay | Admitting: Internal Medicine

## 2022-04-14 ENCOUNTER — Ambulatory Visit (INDEPENDENT_AMBULATORY_CARE_PROVIDER_SITE_OTHER): Payer: Medicare Other | Admitting: Internal Medicine

## 2022-04-14 VITALS — BP 138/76 | HR 63 | Temp 97.8°F | Ht 69.0 in | Wt 286.8 lb

## 2022-04-14 DIAGNOSIS — Z23 Encounter for immunization: Secondary | ICD-10-CM

## 2022-04-14 DIAGNOSIS — Z780 Asymptomatic menopausal state: Secondary | ICD-10-CM

## 2022-04-14 DIAGNOSIS — Z6841 Body Mass Index (BMI) 40.0 and over, adult: Secondary | ICD-10-CM

## 2022-04-14 DIAGNOSIS — Z1151 Encounter for screening for human papillomavirus (HPV): Secondary | ICD-10-CM | POA: Diagnosis not present

## 2022-04-14 DIAGNOSIS — Z1231 Encounter for screening mammogram for malignant neoplasm of breast: Secondary | ICD-10-CM

## 2022-04-14 DIAGNOSIS — R7303 Prediabetes: Secondary | ICD-10-CM

## 2022-04-14 DIAGNOSIS — I10 Essential (primary) hypertension: Secondary | ICD-10-CM | POA: Diagnosis not present

## 2022-04-14 DIAGNOSIS — E785 Hyperlipidemia, unspecified: Secondary | ICD-10-CM | POA: Diagnosis not present

## 2022-04-14 DIAGNOSIS — Z124 Encounter for screening for malignant neoplasm of cervix: Secondary | ICD-10-CM

## 2022-04-14 DIAGNOSIS — Z01419 Encounter for gynecological examination (general) (routine) without abnormal findings: Secondary | ICD-10-CM | POA: Diagnosis present

## 2022-04-14 DIAGNOSIS — I471 Supraventricular tachycardia: Secondary | ICD-10-CM

## 2022-04-14 DIAGNOSIS — R5383 Other fatigue: Secondary | ICD-10-CM | POA: Diagnosis not present

## 2022-04-14 MED ORDER — TETANUS-DIPHTH-ACELL PERTUSSIS 5-2.5-18.5 LF-MCG/0.5 IM SUSY: 0.5000 mL | PREFILLED_SYRINGE | Freq: Once | INTRAMUSCULAR | 0 refills | Status: AC

## 2022-04-14 MED ORDER — TETANUS-DIPHTH-ACELL PERTUSSIS 5-2.5-18.5 LF-MCG/0.5 IM SUSP: 0.5000 mL | Freq: Once | INTRAMUSCULAR | 0 refills | Status: AC

## 2022-04-14 MED ORDER — ZOSTER VAC RECOMB ADJUVANTED 50 MCG/0.5ML IM SUSR: 0.5000 mL | Freq: Once | INTRAMUSCULAR | 1 refills | Status: AC

## 2022-04-14 NOTE — Patient Instructions (Signed)
You received the Prevnar 20 vaccine today for protection against Streptococcal pneumonia   I recommend that you have  The TDaP vaccine and the Shingles vaccine.  I have given you prescriptions for these because they will be FREE AT YOUR  local pharmacy because Medicare will now pay for them   Referral to Dr End for the SVT evaluation

## 2022-04-14 NOTE — Assessment & Plan Note (Signed)
History of ablation at [facility] prior to 2012.  Current episodes occurring 1-2 times per week  No use of stimulants.  May have untreated OSA per husband's report of snoring,  But sleeps on stomach making testing likely to be incomplete.  Referring to cardiology

## 2022-04-14 NOTE — Assessment & Plan Note (Signed)
Well controlled on current regimen of amlodipine and metoprolol. Renal function due for assessment

## 2022-04-14 NOTE — Progress Notes (Unsigned)
Patient ID: Amy Petty, female    DOB: 02-24-1956  Age: 66 y.o. MRN: [id number]  The patient is here for follow up and management of other chronic and acute problems.   The risk factors are reflected in the social history.  The roster of all physicians providing medical care to patient - is listed in the Snapshot section of the chart.  Activities of daily living:  The patient is 100% independent in all ADLs: dressing, toileting, feeding as well as independent mobility  Home safety : The patient has smoke detectors in the home. They wear seatbelts.  There are no firearms at home. There is no violence in the home.   There is no risks for hepatitis, STDs or HIV. There is no   history of blood transfusion. They have no travel history to infectious disease endemic areas of the world.  The patient has seen their dentist in the last six month. They have seen their eye doctor in the last year. They admit to slight hearing difficulty with regard to whispered voices and some televiswedion programs.  They have deferred audiologic testing in the last year.  They do not  have excessive sun exposure. Discussed the need for sun protection: hats, long sleeves and use of sunscreen if there is significant sun exposure.   Diet: the importance of a healthy diet is discussed. They do have a healthy diet.  The benefits of regular aerobic exercise were discussed. She  is not exercising daily   Depression screen: there are no signs or vegative symptoms of depression- irritability, change in appetite, anhedonia, sadness/tearfullness.  Cognitive assessment: the patient manages all their financial and personal affairs and is actively engaged. They could relate day,date,year and events; recalled 2/3 objects at 3 minutes; performed clock-face test normally.  The following portions of the patient's history were reviewed and updated as appropriate: allergies, current medications, past family history, past medical history,   past surgical history, past social history  and problem list.  Visual acuity was not assessed per patient preference since she has regular follow up with her ophthalmologist. Hearing and body mass index were assessed and reviewed.   During the course of the visit the patient was educated and counseled about appropriate screening and preventive services including : fall prevention , diabetes screening, nutrition counseling, colorectal cancer screening, and recommended immunizations.    CC: The primary encounter diagnosis was Postmenopausal estrogen deficiency. Diagnoses of Screening for cervical cancer, Primary hypertension, Hyperlipidemia LDL goal <100, Prediabetes, Other fatigue, SVT (supraventricular tachycardia) (HCC), Class 3 severe obesity due to excess calories without serious comorbidity with body mass index (BMI) of 40.0 to 44.9 in adult [facility]), Need for pneumococcal 20-valent conjugate vaccination, and Encounter for screening mammogram for malignant neoplasm of breast were also pertinent to this visit.   1) right sided sciatica with some foot drop  : getting MRI on Wednesday ordered by ortho  2) Retina exam also scheduled tomorrow  (dark spot)   3) cc:  episodes  of racing hart lasting about 10 seconds at rest.  No chest  pain  dizziness..  wakes her  from sleep.  She has a history of ablation many years ago. Whch was done in  2016 at [facility] for SVT.  Still taking metoprolol.  No cardiologist follow up in years.   Does the valsava maneuver .  1-2 times weekly for the past month.  Has reduced caffeine intake,   no prior sleep study.  "Sleeps well."  But  snores per husband .  Sleeps on stomach.    History Amy Petty has a past medical history of Anemia, Arthritis, Bronchial asthma, Diastolic dysfunction, Fibroid, uterus, GERD (gastroesophageal reflux disease), Hypertension, Hypothyroidism, Motion sickness, Obesity, PVC's (premature ventricular contractions), and SVT (supraventricular tachycardia)  ([place]).   She has a past surgical history that includes Tubal ligation; Carpal tunnel release; Tonsillectomy and adenoidectomy; Carpal tunnel release (2008); and Colonoscopy with propofol (N/A, 09/05/2019).   Her family history includes Breast cancer (age of onset: 8) in her mother; Cancer in her father and mother; Diabetes in her sister; Liver cancer in her father; Lung cancer in her mother.She reports that she has never smoked. She has never used smokeless tobacco. She reports that she does not drink alcohol and does not use drugs.  Outpatient Medications Prior to Visit  Medication Sig Dispense Refill   acetaminophen (TYLENOL) 650 MG CR tablet Take 650 mg by mouth every 8 (eight) hours.     amLODipine (NORVASC) 2.5 MG tablet Take 1 tablet (2.5 mg total) by mouth daily. 90 tablet 0   cholecalciferol (VITAMIN D) 1000 units tablet Take 1,000 Units by mouth daily.     ketoconazole (NIZORAL) 2 % shampoo WASH SCALP 3-4 TIMES PER WEEK. LET SIT SEVERAL MINUTES BEFORE RINSING  3   Melatonin 2.5 MG CHEW Chew 1 tablet by mouth at bedtime.     metoprolol succinate (TOPROL-XL) 100 MG 24 hr tablet Take with or immediately following a meal. 90 tablet 0   montelukast (SINGULAIR) 10 MG tablet Take 1 tablet (10 mg total) by mouth at bedtime. 90 tablet 0   Multiple Vitamin (MULTIVITAMIN) tablet Take 1 tablet by mouth daily.       mupirocin ointment (BACTROBAN) 2 % PLACE 1 APPLICATION INTO THE NOSE 2 (TWO) TIMES DAILY 22 g 0   naproxen sodium (ALEVE) 220 MG tablet Take 220 mg by mouth 2 (two) times daily as needed.     omeprazole (PRILOSEC) 40 MG capsule TAKE ONE CAPSULE BY MOUTH ONE TIME DAILY 90 capsule 2   spironolactone (ALDACTONE) 50 MG tablet Take 1 tablet (50 mg total) by mouth daily. 90 tablet 0   triamcinolone (NASACORT) 55 MCG/ACT AERO nasal inhaler Place 2 sprays into the nose daily.     diclofenac (VOLTAREN) 75 MG EC tablet Take 1 tablet (75 mg total) by mouth 2 (two) times daily. (Patient not taking:  Reported on 04/14/2022) 60 tablet 1   albuterol (PROVENTIL HFA;VENTOLIN HFA) 108 (90 Base) MCG/ACT inhaler Inhale 2 puffs into the lungs every 6 (six) hours as needed for wheezing or shortness of breath. (Patient not taking: Reported on 04/14/2022) 1 Inhaler 0   cyclobenzaprine (FLEXERIL) 10 MG tablet Take 1 tablet (10 mg total) by mouth 3 (three) times daily as needed for muscle spasms. (Patient not taking: Reported on 04/14/2022) 30 tablet 0   Docosanol (ABREVA) 10 % CREA Apply twice daily to cold sores (Patient not taking: Reported on 04/14/2022) 2 g 0   HYDROcodone-acetaminophen (NORCO) 5-325 MG tablet Take 1 tablet by mouth every 4 (four) hours as needed for moderate pain. (Patient not taking: Reported on 04/14/2022) 6 tablet 0   lidocaine (LIDODERM) 5 % Place 1 patch onto the skin every 12 (twelve) hours. Remove & Discard patch within 12 hours or as directed by MD (Patient not taking: Reported on 04/14/2022) 10 patch 0   methylPREDNISolone (MEDROL) 4 MG tablet Take as directed (Patient not taking: Reported on 04/14/2022) 21 tablet 0   nabumetone (RELAFEN)  500 MG tablet Take 1 tablet (500 mg total) by mouth 2 (two) times daily as needed. (Patient not taking: Reported on 04/14/2022) 60 tablet 3   SYMBICORT 80-4.5 MCG/ACT inhaler 1 puff 2 (two) times daily. (Patient not taking: Reported on 04/14/2022)     traMADol (ULTRAM) 50 MG tablet Take 1 tablet (50 mg total) by mouth every 8 (eight) hours as needed. (Patient not taking: Reported on 04/14/2022) 30 tablet 0   No facility-administered medications prior to visit.    Review of Systems  Patient denies headache, fevers, malaise, unintentional weight loss, skin rash, eye pain, sinus congestion and sinus pain, sore throat, dysphagia,  hemoptysis , cough, dyspnea, wheezing, chest pain, palpitations, orthopnea, edema, abdominal pain, nausea, melena, diarrhea, constipation, flank pain, dysuria, hematuria, urinary  Frequency, nocturia, numbness, tingling, seizures,  Focal  weakness, Loss of consciousness,  Tremor, insomnia, depression, anxiety, and suicidal ideation.     Objective:  BP 138/76 (BP Location: Left Arm, Patient Position: Sitting, Cuff Size: Large)   Pulse 63   Temp 97.8 F (36.6 C) (Oral)   Ht 5' 9"  (1.753 m)   Wt 286 lb 12.8 oz (130.1 kg)   LMP 11/23/2013   SpO2 94%   BMI 42.35 kg/m   Physical Exam  General Appearance:    Alert, cooperative, no distress, appears stated age  Head:    Normocephalic, without obvious abnormality, atraumatic  Eyes:    PERRL, conjunctiva/corneas clear, EOM's intact, fundi    benign, both eyes  Ears:    Normal TM's and external ear canals, both ears  Nose:   Nares normal, septum midline, mucosa normal, no drainage    or sinus tenderness  Throat:   Lips, mucosa, and tongue normal; teeth and gums normal  Neck:   Supple, symmetrical, trachea midline, no adenopathy;    thyroid:  no enlargement/tenderness/nodules; no carotid   bruit or JVD  Back:     Symmetric, no curvature, ROM normal, no CVA tenderness  Lungs:     Clear to auscultation bilaterally, respirations unlabored  Chest Wall:    No tenderness or deformity   Heart:    Regular rate and rhythm, S1 and S2 normal, no murmur, rub   or gallop  Breast Exam:    No tenderness, masses, or nipple abnormality  Abdomen:     Soft, non-tender, bowel sounds active all four quadrants,    no masses, no organomegaly  Genitalia:    Pelvic: cervix normal in appearance, external genitalia normal, no adnexal masses or tenderness, no cervical motion tenderness, rectovaginal septum normal, uterus normal size, shape, and consistency and vagina normal without discharge  Extremities:   Extremities normal, atraumatic, no cyanosis or edema  Pulses:   2+ and symmetric all extremities  Skin:   Skin color, texture, turgor normal, no rashes or lesions  Lymph nodes:   Cervical, supraclavicular, and axillary nodes normal  Neurologic:   CNII-XII intact, normal strength, sensation and  reflexes    throughout     Assessment & Plan:   Problem List Items Addressed This Visit     HTN (hypertension)    Well controlled on current regimen of amlodipine and metoprolol. Renal function due for assessment        Relevant Orders   Comp Met (CMET)   Urine Microalbumin w/creat. ratio   Hyperlipidemia LDL goal <100   Relevant Orders   Lipid Profile   Obesity    I have cencouraged  Continued attempts  weight lossusing a low  glycemic index diet , and regular exercise.  screening for diabetes and hypothyroidism in progress        Prediabetes    With obesity ,  Possible sleep apnea given reports of snoring and early morning SVT.  Labs needed ,  Weight loss needed .  screening for thyroid CA in family negative        Relevant Orders   HgB A1c   Screening for breast cancer    Breast exam done and screening mammogram ordered        Screening for cervical cancer    PAP smear was done today        Relevant Orders   Cytology - PAP( [place])   SVT (supraventricular tachycardia) ([place])    History of ablation at [facility] prior to 2012.  Current episodes occurring 1-2 times per week  No use of stimulants.  May have untreated OSA per husband's report of snoring,  But sleeps on stomach making testing likely to be incomplete.  Referring to cardiology       Relevant Orders   Ambulatory referral to Cardiology   Other Visit Diagnoses     Postmenopausal estrogen deficiency    -  Primary   Relevant Orders   DG Bone Density   Other fatigue       Relevant Orders   TSH   CBC with Differential/Platelet   Need for pneumococcal 20-valent conjugate vaccination       Relevant Orders   Pneumococcal conjugate vaccine 20-valent (Prevnar 20) (Completed)        I provided  30 minutes of  face-to-face time during this encounter reviewing patient's current problems and past  ablation,  most recent labs and imaging studies, providing counseling on the above mentioned problems , and  coordination  of care .   Meds ordered this encounter  Medications   Zoster Vaccine Adjuvanted [facility]) injection    Sig: Inject 0.5 mLs into the muscle once for 1 dose.    Dispense:  0.5 mL    Refill:  1   Tdap (BOOSTRIX) 5-2.5-18.5 LF-MCG/0.5 injection    Sig: Inject 0.5 mLs into the muscle once for 1 dose.    Dispense:  0.5 mL    Refill:  0   Tdap (BOOSTRIX) 5-2.5-18.5 LF-MCG/0.5 injection    Sig: Inject 0.5 mLs into the muscle once for 1 dose.    Dispense:  0.5 mL    Refill:  0    Medications Discontinued During This Encounter  Medication Reason   albuterol (PROVENTIL HFA;VENTOLIN HFA) 108 (90 Base) MCG/ACT inhaler    Docosanol (ABREVA) 10 % CREA    HYDROcodone-acetaminophen (NORCO) 5-325 MG tablet    cyclobenzaprine (FLEXERIL) 10 MG tablet    SYMBICORT 80-4.5 MCG/ACT inhaler    lidocaine (LIDODERM) 5 %    methylPREDNISolone (MEDROL) 4 MG tablet    nabumetone (RELAFEN) 500 MG tablet    traMADol (ULTRAM) 50 MG tablet     Follow-up: Return in about 6 months (around 10/14/2022).   Crecencio Mc, MD

## 2022-04-14 NOTE — Assessment & Plan Note (Signed)
I have cencouraged  Continued attempts  weight lossusing a low glycemic index diet , and regular exercise.  screening for diabetes and hypothyroidism in progress

## 2022-04-15 LAB — CBC WITH DIFFERENTIAL/PLATELET
Basophils Absolute: 0.1 10*3/uL (ref 0.0–0.1)
Basophils Relative: 1.5 % (ref 0.0–3.0)
Eosinophils Absolute: 0.2 10*3/uL (ref 0.0–0.7)
Eosinophils Relative: 2.4 % (ref 0.0–5.0)
HCT: 42.2 % (ref 36.0–46.0)
Hemoglobin: 13.8 g/dL (ref 12.0–15.0)
Lymphocytes Relative: 33.2 % (ref 12.0–46.0)
Lymphs Abs: 2.8 10*3/uL (ref 0.7–4.0)
MCHC: 32.8 g/dL (ref 30.0–36.0)
MCV: 93.7 fl (ref 78.0–100.0)
Monocytes Absolute: 0.4 10*3/uL (ref 0.1–1.0)
Monocytes Relative: 4.8 % (ref 3.0–12.0)
Neutro Abs: 5 10*3/uL (ref 1.4–7.7)
Neutrophils Relative %: 58.1 % (ref 43.0–77.0)
Platelets: 282 10*3/uL (ref 150.0–400.0)
RBC: 4.5 Mil/uL (ref 3.87–5.11)
RDW: 14.5 % (ref 11.5–15.5)
WBC: 8.5 10*3/uL (ref 4.0–10.5)

## 2022-04-15 LAB — COMPREHENSIVE METABOLIC PANEL
ALT: 22 U/L (ref 0–35)
AST: 19 U/L (ref 0–37)
Albumin: 4.5 g/dL (ref 3.5–5.2)
Alkaline Phosphatase: 95 U/L (ref 39–117)
BUN: 12 mg/dL (ref 6–23)
CO2: 29 mEq/L (ref 19–32)
Calcium: 10.1 mg/dL (ref 8.4–10.5)
Chloride: 99 mEq/L (ref 96–112)
Creatinine, Ser: 0.66 mg/dL (ref 0.40–1.20)
GFR: 92.26 mL/min (ref >60.00)
Glucose, Bld: 84 mg/dL (ref 70–99)
Potassium: 4.4 mEq/L (ref 3.5–5.1)
Sodium: 139 mEq/L (ref 135–145)
Total Bilirubin: 0.6 mg/dL (ref 0.2–1.2)
Total Protein: 7.3 g/dL (ref 6.0–8.3)

## 2022-04-15 LAB — MICROALBUMIN / CREATININE URINE RATIO
Creatinine,U: 18.8 mg/dL
Microalb Creat Ratio: 3.7 mg/g (ref 0.0–30.0)
Microalb, Ur: <0.7 mg/dL (ref 0.0–1.9)

## 2022-04-15 LAB — LIPID PANEL
Cholesterol: 215 mg/dL — ABNORMAL HIGH (ref 0–200)
HDL: 48.9 mg/dL (ref >39.00)
LDL Cholesterol: 140 mg/dL — ABNORMAL HIGH (ref 0–99)
NonHDL: 166.38
Total CHOL/HDL Ratio: 4
Triglycerides: 130 mg/dL (ref 0.0–149.0)
VLDL: 26 mg/dL (ref 0.0–40.0)

## 2022-04-15 LAB — TSH: TSH: 2.75 u[IU]/mL (ref 0.35–5.50)

## 2022-04-15 LAB — HEMOGLOBIN A1C: Hgb A1c MFr Bld: 6.5 % (ref 4.6–6.5)

## 2022-04-15 NOTE — Assessment & Plan Note (Signed)
With obesity ,  Possible sleep apnea given reports of snoring and early morning SVT.  Labs needed ,  Weight loss needed .  screening for thyroid CA in family negative

## 2022-04-15 NOTE — Assessment & Plan Note (Signed)
Breast exam done and screening mammogram ordered

## 2022-04-15 NOTE — Assessment & Plan Note (Signed)
PAP smear was done today 

## 2022-04-16 ENCOUNTER — Ambulatory Visit
Admission: RE | Admit: 2022-04-16 | Discharge: 2022-04-16 | Disposition: A | Discharge status: Home / Self Care | Payer: Medicare Other | Source: Ambulatory Visit | Attending: Physician Assistant | Admitting: Physician Assistant

## 2022-04-16 DIAGNOSIS — G8929 Other chronic pain: Secondary | ICD-10-CM

## 2022-04-16 LAB — CYTOLOGY - PAP
Adequacy: ABSENT
Comment: NEGATIVE
Diagnosis: NEGATIVE
High risk HPV: NEGATIVE

## 2022-04-17 ENCOUNTER — Encounter: Payer: Self-pay | Admitting: Internal Medicine

## 2022-04-18 ENCOUNTER — Other Ambulatory Visit: Payer: Self-pay

## 2022-04-18 ENCOUNTER — Telehealth: Payer: Self-pay | Admitting: Physician Assistant

## 2022-04-18 ENCOUNTER — Telehealth: Payer: Self-pay | Admitting: Internal Medicine

## 2022-04-18 DIAGNOSIS — G8929 Other chronic pain: Secondary | ICD-10-CM

## 2022-04-18 NOTE — Telephone Encounter (Signed)
Patient would like a virtual or in office appointment to discuss test results. Patient would like a 4:30 appt. How long does the appt. Need to be and can we use that spot.

## 2022-04-18 NOTE — Telephone Encounter (Signed)
Pt called and would like to go to [facility] for her injection, not [facility].   CB [phone]

## 2022-04-21 ENCOUNTER — Other Ambulatory Visit: Payer: Self-pay

## 2022-04-21 ENCOUNTER — Other Ambulatory Visit: Payer: Self-pay | Admitting: Physician Assistant

## 2022-04-21 DIAGNOSIS — G8929 Other chronic pain: Secondary | ICD-10-CM

## 2022-04-21 NOTE — Telephone Encounter (Signed)
Pt is scheduled for 1:00 pm on 04/28/2022.

## 2022-04-21 NOTE — Telephone Encounter (Signed)
Left message with Tye Maryland at [facility], she will review and contact pt to schedule appt

## 2022-04-21 NOTE — Telephone Encounter (Signed)
New order in chart 

## 2022-04-24 ENCOUNTER — Ambulatory Visit
Admission: RE | Admit: 2022-04-24 | Discharge: 2022-04-24 | Disposition: A | Discharge status: Home / Self Care | Payer: Medicare Other | Source: Ambulatory Visit | Attending: Physician Assistant | Admitting: Physician Assistant

## 2022-04-24 DIAGNOSIS — G8929 Other chronic pain: Secondary | ICD-10-CM

## 2022-04-24 MED ORDER — IOPAMIDOL (ISOVUE-M 200) INJECTION 41%
1.0000 mL | Freq: Once | INTRAMUSCULAR | Status: AC
  Administered 2022-04-24: 1 mL via EPIDURAL

## 2022-04-24 MED ORDER — METHYLPREDNISOLONE ACETATE 40 MG/ML INJ SUSP (RADIOLOG
80.0000 mg | Freq: Once | INTRAMUSCULAR | Status: AC
  Administered 2022-04-24: 80 mg via EPIDURAL

## 2022-04-24 NOTE — Discharge Instructions (Signed)

## 2022-04-28 ENCOUNTER — Ambulatory Visit (INDEPENDENT_AMBULATORY_CARE_PROVIDER_SITE_OTHER): Payer: Medicare Other | Admitting: Internal Medicine

## 2022-04-28 ENCOUNTER — Encounter: Payer: Self-pay | Admitting: Internal Medicine

## 2022-04-28 VITALS — BP 110/64 | HR 70 | Temp 97.6°F | Ht 69.0 in | Wt 282.0 lb

## 2022-04-28 DIAGNOSIS — E1169 Type 2 diabetes mellitus with other specified complication: Secondary | ICD-10-CM | POA: Diagnosis not present

## 2022-04-28 DIAGNOSIS — E785 Hyperlipidemia, unspecified: Secondary | ICD-10-CM | POA: Diagnosis not present

## 2022-04-28 DIAGNOSIS — I152 Hypertension secondary to endocrine disorders: Secondary | ICD-10-CM

## 2022-04-28 DIAGNOSIS — E1159 Type 2 diabetes mellitus with other circulatory complications: Secondary | ICD-10-CM

## 2022-04-28 DIAGNOSIS — E669 Obesity, unspecified: Secondary | ICD-10-CM | POA: Diagnosis not present

## 2022-04-28 MED ORDER — ATORVASTATIN CALCIUM 10 MG PO TABS: 10.0000 mg | ORAL_TABLET | Freq: Every day | ORAL | 3 refills | Status: DC

## 2022-04-28 NOTE — Progress Notes (Unsigned)
Subjective:  Patient ID: Amy Petty, female    DOB: 22-Jan-1956  Age: 66 y.o. MRN: [id number]  CC: There were no encounter diagnoses.   HPI Amy Petty presents for discussion of recent lab results which suggest that she has developed type 2 DM  in the setting of morbid obesity, hypertension and hyperlipidemia  Chief Complaint  Patient presents with   Follow-up    Discuss lab results    1) Type 2 DM:  a1c 6.5 .  Normal fasting glucose .  No breakfast,  light lunch (cheese/crackers, or chicken salad on bread )     Outpatient Medications Prior to Visit  Medication Sig Dispense Refill   acetaminophen (TYLENOL) 650 MG CR tablet Take 650 mg by mouth every 8 (eight) hours.     amLODipine (NORVASC) 2.5 MG tablet Take 1 tablet (2.5 mg total) by mouth daily. 90 tablet 0   cholecalciferol (VITAMIN D) 1000 units tablet Take 1,000 Units by mouth daily.     ketoconazole (NIZORAL) 2 % shampoo WASH SCALP 3-4 TIMES PER WEEK. LET SIT SEVERAL MINUTES BEFORE RINSING  3   Melatonin 2.5 MG CHEW Chew 1 tablet by mouth at bedtime.     metoprolol succinate (TOPROL-XL) 100 MG 24 hr tablet Take with or immediately following a meal. 90 tablet 0   montelukast (SINGULAIR) 10 MG tablet Take 1 tablet (10 mg total) by mouth at bedtime. 90 tablet 0   Multiple Vitamin (MULTIVITAMIN) tablet Take 1 tablet by mouth daily.       mupirocin ointment (BACTROBAN) 2 % PLACE 1 APPLICATION INTO THE NOSE 2 (TWO) TIMES DAILY 22 g 0   naproxen sodium (ALEVE) 220 MG tablet Take 220 mg by mouth 2 (two) times daily as needed.     omeprazole (PRILOSEC) 40 MG capsule TAKE ONE CAPSULE BY MOUTH ONE TIME DAILY 90 capsule 2   spironolactone (ALDACTONE) 50 MG tablet Take 1 tablet (50 mg total) by mouth daily. 90 tablet 0   triamcinolone (NASACORT) 55 MCG/ACT AERO nasal inhaler Place 2 sprays into the nose daily.     diclofenac (VOLTAREN) 75 MG EC tablet Take 1 tablet (75 mg total) by mouth 2 (two) times daily. (Patient not taking: Reported  on 04/14/2022) 60 tablet 1   No facility-administered medications prior to visit.    Review of Systems;  Patient denies headache, fevers, malaise, unintentional weight loss, skin rash, eye pain, sinus congestion and sinus pain, sore throat, dysphagia,  hemoptysis , cough, dyspnea, wheezing, chest pain, palpitations, orthopnea, edema, abdominal pain, nausea, melena, diarrhea, constipation, flank pain, dysuria, hematuria, urinary  Frequency, nocturia, numbness, tingling, seizures,  Focal weakness, Loss of consciousness,  Tremor, insomnia, depression, anxiety, and suicidal ideation.      Objective:  LMP 11/23/2013   BP Readings from Last 3 Encounters:  04/24/22 127/78  04/14/22 138/76  09/29/21 138/72    Wt Readings from Last 3 Encounters:  04/14/22 286 lb 12.8 oz (130.1 kg)  11/29/19 286 lb (129.7 kg)  09/05/19 286 lb (129.7 kg)    General appearance: alert, cooperative and appears stated age Ears: normal TM's and external ear canals both ears Throat: lips, mucosa, and tongue normal; teeth and gums normal Neck: no adenopathy, no carotid bruit, supple, symmetrical, trachea midline and thyroid not enlarged, symmetric, no tenderness/mass/nodules Back: symmetric, no curvature. ROM normal. No CVA tenderness. Lungs: clear to auscultation bilaterally Heart: regular rate and rhythm, S1, S2 normal, no murmur, click, rub or gallop Abdomen: soft, non-tender;  bowel sounds normal; no masses,  no organomegaly Pulses: 2+ and symmetric Skin: Skin color, texture, turgor normal. No rashes or lesions Lymph nodes: Cervical, supraclavicular, and axillary nodes normal.  Lab Results  Component Value Date   HGBA1C 6.5 04/14/2022   HGBA1C 6.0 11/24/2018   HGBA1C 5.6 06/29/2018   HGBA1C 5.6 06/29/2018   HGBA1C 5.6 (A) 06/29/2018   HGBA1C 5.6 06/29/2018    Lab Results  Component Value Date   CREATININE 0.66 04/14/2022   CREATININE 0.76 11/24/2018   CREATININE 0.70 11/16/2017    Lab Results   Component Value Date   WBC 8.5 04/14/2022   HGB 13.8 04/14/2022   HCT 42.2 04/14/2022   PLT 282.0 04/14/2022   GLUCOSE 84 04/14/2022   CHOL 215 (H) 04/14/2022   TRIG 130.0 04/14/2022   HDL 48.90 04/14/2022   LDLCALC 140 (H) 04/14/2022   ALT 22 04/14/2022   AST 19 04/14/2022   NA 139 04/14/2022   K 4.4 04/14/2022   CL 99 04/14/2022   CREATININE 0.66 04/14/2022   BUN 12 04/14/2022   CO2 29 04/14/2022   TSH 2.75 04/14/2022   HGBA1C 6.5 04/14/2022   MICROALBUR <0.7 04/14/2022    DG INJECT DIAG/THERA/INC NEEDLE/CATH/PLC EPI/LUMB/SAC W/IMG  Result Date: 04/24/2022 CLINICAL DATA:  Spondylosis without myelopathy. Low back pain and right leg pain. FLUOROSCOPY: Radiation Exposure Index (as provided by the fluoroscopic device): 0 minutes 31 seconds. 46.95 micro gray meter squared PROCEDURE: The procedure, risks, benefits, and alternatives were explained to the patient. Questions regarding the procedure were encouraged and answered. The patient understands and consents to the procedure. LUMBAR EPIDURAL INJECTION: An interlaminar approach was performed on the right at L5-S1. The overlying skin was cleansed and anesthetized. A 6 inch 20 gauge epidural needle was advanced using loss-of-resistance technique. DIAGNOSTIC EPIDURAL INJECTION: Injection of Isovue-M 200 shows a good epidural pattern with spread above and below the level of needle placement, primarily on the right. No vascular opacification is seen. THERAPEUTIC EPIDURAL INJECTION: Eighty mg of Depo-Medrol mixed with 2.5 Cc 1% lidocaine were instilled. The procedure was well-tolerated, and the patient was discharged thirty minutes following the injection in good condition. COMPLICATIONS: None IMPRESSION: Technically successful epidural injection on the right at L5-S1. Electronically Signed   By: Nelson Chimes M.D.   On: 04/24/2022 14:51    Assessment & Plan:   Problem List Items Addressed This Visit   None   I spent a total of   minutes  with this patient in a face to face visit on the date of this encounter reviewing the last office visit with me on        ,  most recent with patient's cardiologist in    ,  patient'ss diet and eating habits, home blood pressure readings ,  most recent imaging study ,   and post visit ordering of testing and therapeutics.    Follow-up: No follow-ups on file.   Crecencio Mc, MD

## 2022-04-28 NOTE — Patient Instructions (Addendum)
Find the WASA  crackers . THEY ARE QUALITY AND LOW CARB.   Limit pizza to one slice ; add a salad.  No [place]  or [place],  or honey mustard  dressing .  No candied nuts or "confetti"    [place] toast once a week  and that's IT FOR THE DAY!  NO PASTA   BERRIES AND CHERRIES ARE FINE TO EAT DAILY    LIMIT  FEEDINGS TO 2 MEALS DURING AN 8 HOUR PERIOD.  YOU  NEED TO FAST THE OTHER 16  HOURS  .  ONLY CUKES, cabbage and celery  during the other time    DIABETIC EYE EXAM ANNUALLY.    Check sugar 2 hours after a meal. ("Post prandial)  .  Should be  160 or less  Normal fasting sugar is 80 to 120.

## 2022-04-29 DIAGNOSIS — I152 Hypertension secondary to endocrine disorders: Secondary | ICD-10-CM | POA: Insufficient documentation

## 2022-04-29 NOTE — Assessment & Plan Note (Signed)
Now with type 2 DM ,  In the setting of hypertension and hyperlipidemia.  She prefers to avoid medications at this time but will return in 3 months for management and will consider metformin and/or GLP 1 agonist if she has not been able to lose 12 lbs.

## 2022-04-29 NOTE — Assessment & Plan Note (Signed)
Diet reviewed..  Ability to exercise compromised by concurrent sciatica.  Advised to follow intermittent fasting with a low GI menu.  Will discuss metformin/GLP 1 agonist at follow up .

## 2022-04-29 NOTE — Assessment & Plan Note (Addendum)
10 year risk  Is now elevated due to concurrent diabetes diagnosis and statin therapy is advised.  Starting atorvastatin .   She will return in 3 months for repeat assessmenet and discussion   Lab Results  Component Value Date   CHOL 215 (H) 04/14/2022   HDL 48.90 04/14/2022   LDLCALC 140 (H) 04/14/2022   TRIG 130.0 04/14/2022   CHOLHDL 4 04/14/2022

## 2022-05-28 ENCOUNTER — Encounter: Payer: Self-pay | Admitting: Internal Medicine

## 2022-05-28 ENCOUNTER — Ambulatory Visit (INDEPENDENT_AMBULATORY_CARE_PROVIDER_SITE_OTHER): Payer: Medicare Other | Admitting: Internal Medicine

## 2022-05-28 VITALS — BP 124/72 | HR 66 | Ht 69.0 in | Wt 280.0 lb

## 2022-05-28 DIAGNOSIS — I471 Supraventricular tachycardia: Secondary | ICD-10-CM | POA: Diagnosis not present

## 2022-05-28 NOTE — Patient Instructions (Signed)
Medication Instructions:   Your physician recommends that you continue on your current medications as directed. Please refer to the Current Medication list given to you today.  *If you need a refill on your cardiac medications before your next appointment, please call your pharmacy*   Lab Work:  None ordered  Testing/Procedures:  Please consider using "Alivecor" or and Apple watch to monitor rhythm/ episodes of palpitations  Call our office if you choose to wear a cardiac event monitor   Follow-Up: At [facility], you and your health needs are our priority.  As part of our continuing mission to provide you with exceptional heart care, we have created designated Provider Care Teams.  These Care Teams include your primary Cardiologist (physician) and Advanced Practice Providers (APPs -  Physician Assistants and Nurse Practitioners) who all work together to provide you with the care you need, when you need it.  We recommend signing up for the patient portal called "MyChart".  Sign up information is provided on this After Visit Summary.  MyChart is used to connect with patients for Virtual Visits (Telemedicine).  Patients are able to view lab/test results, encounter notes, upcoming appointments, etc.  Non-urgent messages can be sent to your provider as well.   To learn more about what you can do with MyChart, go to [email or web address].    Your next appointment:   6 month(s)  The format for your next appointment:   In Person  Provider:   You may see Dr. Harrell Gave End or one of the following Advanced Practice Providers on your designated Care Team:   Murray Hodgkins, NP Christell Faith, PA-C Cadence Kathlen Mody, PA-C{   Important Information About Sugar

## 2022-05-28 NOTE — Progress Notes (Signed)
New Outpatient Visit Date: 05/28/2022  Referring Provider: Crecencio Mc, MD Amy Petty,  Amy Petty [id number]  Chief Complaint: Palpitations  HPI:  Amy Petty is a 66 y.o. female who is being seen today for the evaluation of palpitations at the request of Dr. Derrel Nip. She has a history of SVT status post ablation at [facility] in 2016, as well as hypertension, hyperlipidemia, and type 2 diabetes mellitus.  Amy Petty has noticed her heart pounding intermittently over the last 1.5 months.  It had been happening about twice a week but has been absent for the last 2 weeks.  Episodes usually happen in the evenings when she is at rest and last for about a minute.  There are no associated symptoms.  The palpitations are less severe than what she experienced with SVT prior to her ablation in 2016.  She has not had any chest pain, shortness of breath and lightheadedness.  Amy Petty notes intermittent dependent swelling in her legs, which has been a longstanding.  She also has numbness in her toes.  She reports that she has snored for quite some time.  She does not think that she would be able to tolerate CPAP as she primarily sleeps on her stomach.  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  LVEF 60-65% with grade 1 diastolic dysfunction.  Normal RV size and function.  No significant valvular abnormality.  Mild left atrial enlargement.  Recent CV Pertinent Labs: Lab Results  Component Value Date   CHOL 215 (H) 04/14/2022   HDL 48.90 04/14/2022   LDLCALC 140 (H) 04/14/2022   TRIG  130.0 04/14/2022   CHOLHDL 4 04/14/2022   K 4.4 04/14/2022   BUN 12 04/14/2022   CREATININE 0.66 04/14/2022    --------------------------------------------------------------------------------------------------  Past Medical History:  Diagnosis Date   Anemia    iron deficiency, due to fibroids   Arthritis    Bronchial asthma    Diastolic dysfunction    Grade 1 per echo Oct 2012; EF is normal.   Fibroid, uterus    with menorrhagia   GERD (gastroesophageal reflux disease)    Hypertension    Hypothyroidism    Motion sickness    Obesity    PVC's (premature ventricular contractions)    SVT (supraventricular tachycardia) ([place])     Past Surgical History:  Procedure Laterality Date   CARPAL TUNNEL RELEASE     CARPAL TUNNEL RELEASE  2008   right hand. wears  brace on left   COLONOSCOPY WITH PROPOFOL N/A 09/05/2019   Procedure: COLONOSCOPY WITH PROPOFOL;  Surgeon: Lucilla Lame, MD;  Location: [facility];  Service: Endoscopy;  Laterality: N/A;   TONSILLECTOMY AND ADENOIDECTOMY     TUBAL LIGATION      Current Meds  Medication Sig   acetaminophen (TYLENOL) 650 MG CR tablet Take 650 mg by mouth every 8 (eight) hours.   amLODipine (NORVASC) 2.5 MG tablet Take 1 tablet (2.5 mg total) by mouth daily.   atorvastatin (LIPITOR) 10 MG tablet Take 1 tablet (10 mg total) by mouth daily.   cholecalciferol (VITAMIN D) 1000 units tablet Take 1,000 Units by mouth daily.   ketoconazole (NIZORAL) 2 % shampoo WASH SCALP 3-4 TIMES PER WEEK. LET SIT SEVERAL  MINUTES BEFORE RINSING   Melatonin 2.5 MG CHEW Chew 1 tablet by mouth at bedtime.   metoprolol succinate (TOPROL-XL) 100 MG 24 hr tablet Take with or immediately following a meal.   montelukast (SINGULAIR) 10 MG tablet Take 1 tablet (10 mg total) by mouth at bedtime.   Multiple Vitamin (MULTIVITAMIN) tablet Take 1 tablet by mouth daily.     mupirocin ointment (BACTROBAN) 2 % PLACE 1 APPLICATION INTO THE NOSE 2 (TWO) TIMES DAILY    naproxen sodium (ALEVE) 220 MG tablet Take 220 mg by mouth 2 (two) times daily as needed.   omeprazole (PRILOSEC) 40 MG capsule TAKE ONE CAPSULE BY MOUTH ONE TIME DAILY   spironolactone (ALDACTONE) 50 MG tablet Take 1 tablet (50 mg total) by mouth daily.   triamcinolone (NASACORT) 55 MCG/ACT AERO nasal inhaler Place 2 sprays into the nose daily.    Allergies: Gabapentin, Penicillins, and Sulfonamide derivatives  Social History   Tobacco Use   Smoking status: Never   Smokeless tobacco: Never  Vaping Use   Vaping Use: Never used  Substance Use Topics   Alcohol use: No   Drug use: No    Family History  Problem Relation Age of Onset   Cancer Mother        Lung Ca second hand smoke   Lung cancer Mother    Breast cancer Mother 62   Cancer Father        liver and prostate Ca   Liver cancer Father    Diabetes Sister     Review of Systems: A 12-system review of systems was performed and was negative except as noted in the HPI.  --------------------------------------------------------------------------------------------------  Physical Exam: BP 124/72 (BP Location: Left Arm, Patient Position: Sitting, Cuff Size: Large)   Pulse 66   Ht '5\' 9"'$  (1.753 m)   Wt 280 lb (127 kg)   LMP 11/23/2013   SpO2 94%   BMI 41.35 kg/m   General: NAD. HEENT: No conjunctival pallor or scleral icterus. Facemask in place. Neck: Supple without lymphadenopathy, thyromegaly, JVD, or HJR. No carotid bruit. Lungs: Normal work of breathing. Clear to auscultation bilaterally without wheezes or crackles. Heart: Regular rate and rhythm without murmurs, rubs, or gallops. Non-displaced PMI. Abd: Bowel sounds present. Soft, NT/ND without hepatosplenomegaly Ext: No lower extremity edema. Radial, PT, and DP pulses are 2+ bilaterally Skin: Warm and dry without rash. Neuro: CNIII-XII intact. Strength and fine-touch sensation intact in upper and lower extremities bilaterally. Psych: Normal mood and  affect.  EKG:  Normal sinus rhythm without abnormality.  Lab Results  Component Value Date   WBC 8.5 04/14/2022   HGB 13.8 04/14/2022   HCT 42.2 04/14/2022   MCV 93.7 04/14/2022   PLT 282.0 04/14/2022    Lab Results  Component Value Date   NA 139 04/14/2022   K 4.4 04/14/2022   CL 99 04/14/2022   CO2 29 04/14/2022   BUN 12 04/14/2022   CREATININE 0.66 04/14/2022   GLUCOSE 84 04/14/2022   ALT 22 04/14/2022    Lab Results  Component Value Date   CHOL 215 (H) 04/14/2022   HDL 48.90 04/14/2022   LDLCALC 140 (H) 04/14/2022   TRIG 130.0 04/14/2022   CHOLHDL 4 04/14/2022     --------------------------------------------------------------------------------------------------  ASSESSMENT AND PLAN: Palpitations and history of SVT: Palpitations have been present for about 1.5 months and are usually self-limited without associated symptoms.  She notes that the episodes feel different than what she experienced with SVT prior to her  ablation in 2016.  Recent labs by Dr. Derrel Nip were unrevealing.  We discussed ambulatory cardiac monitoring, though yield may be low given that she has not had any further episodes in the last 2 weeks.  I recommended that she consider a home monitoring device such as an Apple Watch or [employer] that would allow her to obtain a rhythm strip if she experiences further palpitations.  I have also encouraged her to continue minimizing her caffeine intake.  Hypertension: BP well-controlled today.  Continue management/follow-up per Dr. Derrel Nip.  Morbid obesity: BMI > 40.  Given snoring and concern for supraventricular arrhythmias, we discussed utility of a sleep study.  As Ms. Scalia does not believe that she would tolerate CPAP therapy, we have agreed to defer sleep evaluation at this time.  Weight loss encouraged through diet and exercise.  Follow-up: Return to clinic in 6 months.  Nelva Bush, MD 05/28/2022 3:12 PM

## 2022-05-29 ENCOUNTER — Encounter: Payer: Self-pay | Admitting: Internal Medicine

## 2022-06-04 ENCOUNTER — Ambulatory Visit
Admission: RE | Admit: 2022-06-04 | Discharge: 2022-06-04 | Disposition: A | Discharge status: Home / Self Care | Payer: Medicare Other | Source: Ambulatory Visit | Attending: Internal Medicine | Admitting: Internal Medicine

## 2022-06-04 DIAGNOSIS — Z780 Asymptomatic menopausal state: Secondary | ICD-10-CM | POA: Diagnosis present

## 2022-06-09 ENCOUNTER — Other Ambulatory Visit: Payer: Self-pay | Admitting: Internal Medicine

## 2022-06-09 ENCOUNTER — Other Ambulatory Visit: Payer: Self-pay

## 2022-06-09 DIAGNOSIS — G8929 Other chronic pain: Secondary | ICD-10-CM

## 2022-06-09 NOTE — Telephone Encounter (Signed)
Referral put in chart

## 2022-06-11 ENCOUNTER — Other Ambulatory Visit: Payer: Self-pay

## 2022-06-11 DIAGNOSIS — G8929 Other chronic pain: Secondary | ICD-10-CM

## 2022-06-12 ENCOUNTER — Other Ambulatory Visit: Payer: Self-pay | Admitting: Physician Assistant

## 2022-06-12 DIAGNOSIS — G8929 Other chronic pain: Secondary | ICD-10-CM

## 2022-06-12 NOTE — Telephone Encounter (Signed)
I did call Amy Petty at [facility] first thing this morning -- she will contact the patient to schedule. I spoke with the patient to inform her of this.

## 2022-06-13 NOTE — Telephone Encounter (Signed)
Thank you so much

## 2022-06-16 ENCOUNTER — Telehealth: Payer: Self-pay | Admitting: Radiology

## 2022-06-16 NOTE — Telephone Encounter (Signed)
-----   Message from Anise Salvo, [place] sent at 06/13/2022  2:42 PM EDT ----- Regarding: FW: ESI vs Lumbar Facet Joint Injections Right l4-l5 facet injection   HEY Jamey Demchak!   ----- Message ----- From: Pete Pelt, PA-C Sent: 06/13/2022   2:34 PM EDT To: Robyne Peers, RT Subject: RE: ESI vs Lumbar Facet Joint Injections       Right l4-l5 facet injection ----- Message ----- From: Robyne Peers, RT Sent: 06/13/2022   9:58 AM EDT To: Pete Pelt, PA-C Subject: FW: ESI vs Lumbar Facet Joint Injections       Artis Delay please advise ----- Message ----- From: Betsey Holiday, RT Sent: 06/13/2022   9:16 AM EDT To: Robyne Peers, RT Subject: RE: ESI vs Lumbar Facet Joint Injections       Is he requesting a certain side or level or will this be per radiologist? ----- Message ----- From: Milus Height, Autumn L, RT Sent: 06/13/2022   8:58 AM EDT To: Betsey Holiday, RT Subject: RE: ESI vs Lumbar Facet Joint Injections       Pete Pelt, PA-C to Me     06/09/22 1:16 PM Could we send her for a possible facet injection at [facility]?   Thanks  Thanks for catching  ----- Message ----- From: Betsey Holiday, RT Sent: 06/13/2022   8:20 AM EDT To: Marlyne Beards, CMA; Autumn L Hornaday, RT Subject: ESI vs Lumbar Facet Joint Injections           Hello Terri and Autumn. We have this patient scheduled for an injection at [facility] on 8/8. The referral states lumbar ESI, but the patient message on 7/31 mentions possible facet joint injections. Could you get clarification on what Artis Delay is requesting for this patient? Thank You, Loma Sousa

## 2022-06-17 ENCOUNTER — Ambulatory Visit
Admission: RE | Admit: 2022-06-17 | Discharge: 2022-06-17 | Disposition: A | Discharge status: Home / Self Care | Payer: Medicare Other | Source: Ambulatory Visit | Attending: Physician Assistant | Admitting: Physician Assistant

## 2022-06-17 DIAGNOSIS — G8929 Other chronic pain: Secondary | ICD-10-CM

## 2022-06-17 MED ORDER — METHYLPREDNISOLONE ACETATE 40 MG/ML INJ SUSP (RADIOLOG
80.0000 mg | Freq: Once | INTRAMUSCULAR | Status: AC
  Administered 2022-06-17: 80 mg via INTRA_ARTICULAR

## 2022-06-17 MED ORDER — IOPAMIDOL (ISOVUE-M 200) INJECTION 41%
1.0000 mL | Freq: Once | INTRAMUSCULAR | Status: AC
  Administered 2022-06-17: 1 mL via INTRA_ARTICULAR

## 2022-06-17 NOTE — Discharge Instructions (Signed)

## 2022-06-18 ENCOUNTER — Other Ambulatory Visit: Payer: Medicare Other

## 2022-08-11 ENCOUNTER — Other Ambulatory Visit: Payer: Medicare Other

## 2022-08-11 ENCOUNTER — Ambulatory Visit: Payer: Medicare Other | Admitting: Internal Medicine

## 2022-08-21 ENCOUNTER — Other Ambulatory Visit: Payer: Medicare Other

## 2022-08-21 ENCOUNTER — Other Ambulatory Visit (INDEPENDENT_AMBULATORY_CARE_PROVIDER_SITE_OTHER): Payer: Medicare Other

## 2022-08-21 DIAGNOSIS — I152 Hypertension secondary to endocrine disorders: Secondary | ICD-10-CM

## 2022-08-21 DIAGNOSIS — E1169 Type 2 diabetes mellitus with other specified complication: Secondary | ICD-10-CM

## 2022-08-21 DIAGNOSIS — E669 Obesity, unspecified: Secondary | ICD-10-CM | POA: Diagnosis not present

## 2022-08-21 DIAGNOSIS — E785 Hyperlipidemia, unspecified: Secondary | ICD-10-CM | POA: Diagnosis not present

## 2022-08-21 DIAGNOSIS — E1159 Type 2 diabetes mellitus with other circulatory complications: Secondary | ICD-10-CM

## 2022-08-22 LAB — HEMOGLOBIN A1C: Hgb A1c MFr Bld: 6.6 % — ABNORMAL HIGH (ref 4.6–6.5)

## 2022-08-22 LAB — COMPREHENSIVE METABOLIC PANEL
ALT: 19 U/L (ref 0–35)
AST: 15 U/L (ref 0–37)
Albumin: 4.4 g/dL (ref 3.5–5.2)
Alkaline Phosphatase: 83 U/L (ref 39–117)
BUN: 22 mg/dL (ref 6–23)
CO2: 30 mEq/L (ref 19–32)
Calcium: 10.2 mg/dL (ref 8.4–10.5)
Chloride: 98 mEq/L (ref 96–112)
Creatinine, Ser: 0.73 mg/dL (ref 0.40–1.20)
GFR: 86.28 mL/min (ref >60.00)
Glucose, Bld: 83 mg/dL (ref 70–99)
Potassium: 4.4 mEq/L (ref 3.5–5.1)
Sodium: 137 mEq/L (ref 135–145)
Total Bilirubin: 0.7 mg/dL (ref 0.2–1.2)
Total Protein: 7.2 g/dL (ref 6.0–8.3)

## 2022-08-22 LAB — LIPID PANEL
Cholesterol: 159 mg/dL (ref 0–200)
HDL: 53.3 mg/dL (ref >39.00)
LDL Cholesterol: 86 mg/dL (ref 0–99)
NonHDL: 106.02
Total CHOL/HDL Ratio: 3
Triglycerides: 100 mg/dL (ref 0.0–149.0)
VLDL: 20 mg/dL (ref 0.0–40.0)

## 2022-08-25 ENCOUNTER — Encounter: Payer: Self-pay | Admitting: Internal Medicine

## 2022-08-25 ENCOUNTER — Ambulatory Visit (INDEPENDENT_AMBULATORY_CARE_PROVIDER_SITE_OTHER): Payer: Medicare Other | Admitting: Internal Medicine

## 2022-08-25 VITALS — BP 114/66 | HR 72 | Temp 97.8°F | Ht 69.0 in | Wt 283.0 lb

## 2022-08-25 DIAGNOSIS — Z23 Encounter for immunization: Secondary | ICD-10-CM

## 2022-08-25 DIAGNOSIS — E1169 Type 2 diabetes mellitus with other specified complication: Secondary | ICD-10-CM | POA: Diagnosis not present

## 2022-08-25 DIAGNOSIS — I152 Hypertension secondary to endocrine disorders: Secondary | ICD-10-CM | POA: Diagnosis not present

## 2022-08-25 DIAGNOSIS — E1159 Type 2 diabetes mellitus with other circulatory complications: Secondary | ICD-10-CM | POA: Diagnosis not present

## 2022-08-25 DIAGNOSIS — E785 Hyperlipidemia, unspecified: Secondary | ICD-10-CM | POA: Diagnosis not present

## 2022-08-25 DIAGNOSIS — E669 Obesity, unspecified: Secondary | ICD-10-CM | POA: Diagnosis not present

## 2022-08-25 DIAGNOSIS — I471 Supraventricular tachycardia, unspecified: Secondary | ICD-10-CM

## 2022-08-25 DIAGNOSIS — I10 Essential (primary) hypertension: Secondary | ICD-10-CM

## 2022-08-25 MED ORDER — TETANUS-DIPHTH-ACELL PERTUSSIS 5-2.5-18.5 LF-MCG/0.5 IM SUSY: 0.5000 mL | PREFILLED_SYRINGE | Freq: Once | INTRAMUSCULAR | 0 refills | Status: AC

## 2022-08-25 MED ORDER — ZOSTER VAC RECOMB ADJUVANTED 50 MCG/0.5ML IM SUSR: 0.5000 mL | Freq: Once | INTRAMUSCULAR | 1 refills | Status: AC

## 2022-08-25 NOTE — Assessment & Plan Note (Addendum)
Diet controlled.  Metformin intolerant.  GLP1 agonist reviewed, deferred  For now. Continue statin.  Add ARB once proteinuria is noted .  pnuemonia vaccine up to date   Lab Results  Component Value Date   HGBA1C 6.6 (H) 08/21/2022   Lab Results  Component Value Date   MICROALBUR <0.7 04/14/2022   MICROALBUR <0.7 11/24/2018

## 2022-08-25 NOTE — Progress Notes (Unsigned)
Subjective:  Patient ID: Amy Petty, female    DOB: 1956/10/06  Age: 66 y.o. MRN: [id number]  CC: The primary encounter diagnosis was Obesity, diabetes, and hypertension syndrome ([place]). Diagnoses of Hyperlipidemia LDL goal <100, Morbid obesity ([place]), Need for immunization against influenza, Primary hypertension, and SVT (supraventricular tachycardia) were also pertinent to this visit.   HPI Amy Petty presents for follow up on newly diagnosed type 2 DM complicated by obesity and hypertension  Chief Complaint  Patient presents with   Follow-up    3 month follow up    1) Type 2 DM , obesity, hypertension:   T2DM:  She  feels generally well,  But is not  exercising regularly due to  chronic low back pain and recently developed  shoulder and neck pain .  She  has gained 3 lb since her July visit. Diet reviewed:  eats pasta 2-3 times per week .   Eats a baked potato 1/week. Fried flounder,  grilled fish  Eats carrot cake twice weekly,  or fruit for dessert ,  usually snacks on chips in the afternoon.  Drinks Dr Malachi Bonds Zero or r water  does not eat breakfast.  First meal is usually at 3 pm.    Denies numbness, burning and tingling of extremities   Fasting sugars 90 to 105.  2) Orthopedics:  a) Low back pain: chronic.  No surgeries,  but  s./p ESI x 2 . B) shoulder pain:  recently evaluated and treated with a right subacromial bursa  steroid injection and a prednisone taper   by  [facility].  Feels that the medication helped.  Feels that the lower back pain is improved with use of naproxen and gabapentin, now taken  every other day   3) HTN:  Patient is taking her amlodipine  and metoprolol as prescribed and notes no adverse effects.  Home BP readings have been done about once per week and are  generally < 130/80 .  She is avoiding added salt in her diet and.   Outpatient Medications Prior to Visit  Medication Sig Dispense Refill   acetaminophen (TYLENOL) 650 MG CR tablet Take 650 mg by  mouth 2 (two) times daily.     amLODipine (NORVASC) 2.5 MG tablet TAKE ONE TABLET BY MOUTH ONE TIME DAILY 90 tablet 1   atorvastatin (LIPITOR) 10 MG tablet Take 1 tablet (10 mg total) by mouth daily. 90 tablet 3   cholecalciferol (VITAMIN D) 1000 units tablet Take 1,000 Units by mouth daily.     ketoconazole (NIZORAL) 2 % shampoo WASH SCALP 3-4 TIMES PER WEEK. LET SIT SEVERAL MINUTES BEFORE RINSING  3   Melatonin 2.5 MG CHEW Chew 1 tablet by mouth at bedtime.     metoprolol succinate (TOPROL-XL) 100 MG 24 hr tablet TAKE ONE TABLET BY MOUTH ONE TIME DAILY WITH OR IMMEDIATELY FOLLOWING A MEAL 90 tablet 1   montelukast (SINGULAIR) 10 MG tablet TAKE ONE TABLET BY MOUTH ONE TIME DAILY AT BEDTIME 90 tablet 1   Multiple Vitamin (MULTIVITAMIN) tablet Take 1 tablet by mouth daily.       naproxen sodium (ALEVE) 220 MG tablet Take 220 mg by mouth 2 (two) times daily.     omeprazole (PRILOSEC) 40 MG capsule TAKE ONE CAPSULE BY MOUTH ONE TIME DAILY 90 capsule 2   spironolactone (ALDACTONE) 50 MG tablet TAKE ONE TABLET BY MOUTH ONE TIME DAILY 90 tablet 1   triamcinolone (NASACORT) 55 MCG/ACT AERO nasal inhaler Place  2 sprays into the nose daily.     mupirocin ointment (BACTROBAN) 2 % PLACE 1 APPLICATION INTO THE NOSE 2 (TWO) TIMES DAILY (Patient not taking: Reported on 08/25/2022) 22 g 0   No facility-administered medications prior to visit.    Review of Systems;  Patient denies headache, fevers, malaise, unintentional weight loss, skin rash, eye pain, sinus congestion and sinus pain, sore throat, dysphagia,  hemoptysis , cough, dyspnea, wheezing, chest pain, palpitations, orthopnea, edema, abdominal pain, nausea, melena, diarrhea, constipation, flank pain, dysuria, hematuria, urinary  Frequency, nocturia, numbness, tingling, seizures,  Focal weakness, Loss of consciousness,  Tremor, insomnia, depression, anxiety, and suicidal ideation.      Objective:  BP 114/66 (BP Location: Left Arm, Patient Position:  Sitting, Cuff Size: Large)   Pulse 72   Temp 97.8 F (36.6 C) (Oral)   Ht '5\' 9"'$  (1.753 m)   Wt 283 lb (128.4 kg)   LMP 11/23/2013   SpO2 95%   BMI 41.79 kg/m   BP Readings from Last 3 Encounters:  08/25/22 114/66  06/17/22 127/81  05/28/22 124/72    Wt Readings from Last 3 Encounters:  08/25/22 283 lb (128.4 kg)  05/28/22 280 lb (127 kg)  04/28/22 282 lb (127.9 kg)    General appearance: alert, cooperative and appears stated age Ears: normal TM's and external ear canals both ears Throat: lips, mucosa, and tongue normal; teeth and gums normal Neck: no adenopathy, no carotid bruit, supple, symmetrical, trachea midline and thyroid not enlarged, symmetric, no tenderness/mass/nodules Back: symmetric, no curvature. ROM normal. No CVA tenderness. Lungs: clear to auscultation bilaterally Heart: regular rate and rhythm, S1, S2 normal, no murmur, click, rub or gallop Abdomen: soft, non-tender; bowel sounds normal; no masses,  no organomegaly Pulses: 2+ and symmetric Skin: Skin color, texture, turgor normal. No rashes or lesions Lymph nodes: Cervical, supraclavicular, and axillary nodes normal. Neuro:  awake and interactive with normal mood and affect. Higher cortical functions are normal. Speech is clear without word-finding difficulty or dysarthria. Extraocular movements are intact. Visual fields of both eyes are grossly intact. Sensation to light touch is grossly intact bilaterally of upper and lower extremities. Motor examination shows 4+/5 symmetric hand grip and upper extremity and 5/5 lower extremity strength. There is no pronation or drift. Gait is non-ataxic   Lab Results  Component Value Date   HGBA1C 6.6 (H) 08/21/2022   HGBA1C 6.5 04/14/2022   HGBA1C 6.0 11/24/2018    Lab Results  Component Value Date   CREATININE 0.73 08/21/2022   CREATININE 0.66 04/14/2022   CREATININE 0.76 11/24/2018    Lab Results  Component Value Date   WBC 8.5 04/14/2022   HGB 13.8  04/14/2022   HCT 42.2 04/14/2022   PLT 282.0 04/14/2022   GLUCOSE 83 08/21/2022   CHOL 159 08/21/2022   TRIG 100.0 08/21/2022   HDL 53.30 08/21/2022   LDLCALC 86 08/21/2022   ALT 19 08/21/2022   AST 15 08/21/2022   NA 137 08/21/2022   K 4.4 08/21/2022   CL 98 08/21/2022   CREATININE 0.73 08/21/2022   BUN 22 08/21/2022   CO2 30 08/21/2022   TSH 2.75 04/14/2022   HGBA1C 6.6 (H) 08/21/2022   MICROALBUR <0.7 04/14/2022    DG FACET JT INJ L /S SINGLE LEVEL RIGHT W/FL/CT  Result Date: 06/17/2022 CLINICAL DATA:  66 year old female with chronic right-sided low back pain. She presents for right L4-L5 facet injection. EXAM: LEFT L4-5 FACET INJECTION UNDER FLUOROSCOPY TECHNIQUE: Overlying skin prepped with  Betadine, draped in the usual sterile fashion, and infiltrated locally with buffered Lidocaine. Curved 22 gauge spinal needle advanced to the posterior aspect of the right L4-5 facet. 80 mg Depo-Medrol and 2 ml 1% lidocaine was then administered. No immediate complication. FLUOROSCOPY: Radiation Exposure Index (as provided by the fluoroscopic device): 4.8 mGy Kerma IMPRESSION: Technically successful right L4-5 facet injection under fluoroscopy. Electronically Signed   By: Jacqulynn Cadet M.D.   On: 06/17/2022 14:00    Assessment & Plan:   Problem List Items Addressed This Visit     HTN (hypertension)    Well controlled on current regimen. Renal function normal; no proteinuria.  continue amlodipine and metoprolol at current doses.   Lab Results  Component Value Date   CREATININE 0.73 08/21/2022   Lab Results  Component Value Date   MICROALBUR <0.7 04/14/2022   MICROALBUR <0.7 11/24/2018          Hyperlipidemia LDL goal <100   Relevant Orders   Lipid panel   LDL cholesterol, direct   Morbid obesity ([place])    I have addressed  BMI and recommended wt loss of 10% of body weigh over the next 6 months using a low glycemic index diet and regular exercise a minimum of 5 days per  week.  REVIEWED  The risks and benefits of GLP 1 agonist therapy  She has no contraindications b ut has deferred for now.       Obesity, diabetes, and hypertension syndrome ([place]) - Primary    Diet controlled.  Metformin intolerant.  GLP1 agonist reviewed, deferred  For now. Continue statin.  Add ARB once proteinuria is noted .  pnuemonia vaccine up to date   Lab Results  Component Value Date   HGBA1C 6.6 (H) 08/21/2022   Lab Results  Component Value Date   MICROALBUR <0.7 04/14/2022   MICROALBUR <0.7 11/24/2018           Relevant Orders   Comprehensive metabolic panel   Hemoglobin A1c   SVT (supraventricular tachycardia)    Managed with metoprolol. No changes today       Other Visit Diagnoses     Need for immunization against influenza       Relevant Orders   Flu Vaccine QUAD High Dose(Fluad) (Completed)       I spent a total of  30   minutes with this patient in a face to face visit on the date of this encounter reviewing the last office visit with me in   July .   most recent visit with orthopedics, ,  patient's diet and exercise habits, home blood pressure /blood sugar readings, counselling about weight and diabetes management and post visit ordering of testing and therapeutics.    Follow-up: Return in about 18 weeks (around 12/29/2022) for follow up diabetes.   Crecencio Mc, MD

## 2022-08-25 NOTE — Assessment & Plan Note (Signed)
I have addressed  BMI and recommended wt loss of 10% of body weigh over the next 6 months using a low glycemic index diet and regular exercise a minimum of 5 days per week.  REVIEWED  The risks and benefits of GLP 1 agonist therapy  She has no contraindications b ut has deferred for now.  

## 2022-08-25 NOTE — Patient Instructions (Addendum)
Check post prandial sugars  ,  GOAL IS  < 160   Walk for 30 minutes daily.  When it gets easy ,  CHANGE YOUR ROUTINE , (or get a Physiological scientist )   NO MORE SKIPPING BREAKFAST !   DRINK A PREMIER PROTEIN SHAKES

## 2022-08-26 NOTE — Assessment & Plan Note (Signed)
Managed with metoprolol. No changes today

## 2022-08-26 NOTE — Assessment & Plan Note (Signed)
Well controlled on current regimen. Renal function normal; no proteinuria.  continue amlodipine and metoprolol at current doses.   Lab Results  Component Value Date   CREATININE 0.73 08/21/2022   Lab Results  Component Value Date   MICROALBUR <0.7 04/14/2022   MICROALBUR <0.7 11/24/2018

## 2022-09-18 ENCOUNTER — Encounter: Payer: Self-pay | Admitting: Family Medicine

## 2022-09-18 ENCOUNTER — Telehealth (INDEPENDENT_AMBULATORY_CARE_PROVIDER_SITE_OTHER): Payer: Medicare Other | Admitting: Family Medicine

## 2022-09-18 VITALS — Ht 69.0 in | Wt 283.0 lb

## 2022-09-18 DIAGNOSIS — R0981 Nasal congestion: Secondary | ICD-10-CM

## 2022-09-18 DIAGNOSIS — R059 Cough, unspecified: Secondary | ICD-10-CM

## 2022-09-18 DIAGNOSIS — R519 Headache, unspecified: Secondary | ICD-10-CM | POA: Diagnosis not present

## 2022-09-18 MED ORDER — DOXYCYCLINE HYCLATE 100 MG PO TABS: 100.0000 mg | ORAL_TABLET | Freq: Two times a day (BID) | ORAL | 0 refills | Status: DC

## 2022-09-18 MED ORDER — PREDNISONE 20 MG PO TABS: 40.0000 mg | ORAL_TABLET | Freq: Every day | ORAL | 0 refills | Status: DC

## 2022-09-18 MED ORDER — PROMETHAZINE-DM 6.25-15 MG/5ML PO SYRP: 5.0000 mL | ORAL_SOLUTION | Freq: Two times a day (BID) | ORAL | 0 refills | Status: DC | PRN

## 2022-09-18 NOTE — Patient Instructions (Signed)
-  I sent the medication(s) we discussed to your pharmacy: Meds ordered this encounter  Medications   doxycycline (VIBRA-TABS) 100 MG tablet    Sig: Take 1 tablet (100 mg total) by mouth 2 (two) times daily.    Dispense:  20 tablet    Refill:  0   predniSONE (DELTASONE) 20 MG tablet    Sig: Take 2 tablets (40 mg total) by mouth daily with breakfast.    Dispense:  8 tablet    Refill:  0   promethazine-dextromethorphan (PROMETHAZINE-DM) 6.25-15 MG/5ML syrup    Sig: Take 5 mLs by mouth 2 (two) times daily as needed for cough.    Dispense:  118 mL    Refill:  0     I hope you are feeling better soon!  Seek in person care promptly if your symptoms worsen, new concerns arise or you are not improving with treatment.  It was nice to meet you today. I help [place] out with telemedicine visits on Tuesdays and Thursdays and am happy to help if you need a virtual follow up visit on those days. Otherwise, if you have any concerns or questions following this visit please schedule a follow up visit with your Primary Care office or seek care at a local urgent care clinic to avoid delays in care. If you are having severe or life threatening symptoms please call 911 and/or go to the nearest emergency room.

## 2022-09-18 NOTE — Progress Notes (Signed)
Virtual Visit via Video Note  I connected with Amy Petty  on 09/18/22 at 12:40 PM EST by a video enabled telemedicine application and verified that I am speaking with the correct person using two identifiers.  Location patient: [place] Location provider:work or home office Persons participating in the virtual visit: patient, provider  I discussed the limitations and requested verbal permission for telemedicine visit. The patient expressed understanding and agreed to proceed.   HPI:  Acute telemedicine visit for a cough: -Onset: about 2 weeks ago -Symptoms include: coughing up a lot of yellow mucus, feels has bronchitis, sinus congestion - also discolored and thick, some sinus discomfort -Denies: CP, SOB, NVD -Has tried:singulair and nasocort  -Pertinent past medical history: see below -Pertinent medication allergies: Allergies  Allergen Reactions   Penicillins Swelling    Full body   Sulfonamide Derivatives Swelling    Full body   -COVID-19 vaccine status:  Immunization History  Administered Date(s) Administered   Fluad Quad(high Dose 65+) 08/25/2022   Influenza Inj Mdck Quad Pf 08/19/2019   Influenza Split 08/20/2011, 08/11/2012   Influenza,inj,Quad PF,6+ Mos 08/15/2013, 09/26/2014, 09/04/2015, 08/05/2016, 09/18/2017, 07/21/2018, 09/09/2021   PFIZER(Purple Top)SARS-COV-2 Vaccination 02/25/2020, 03/24/2020   PNEUMOCOCCAL CONJUGATE-20 04/14/2022   Tdap 08/20/2011     ROS: See pertinent positives and negatives per HPI.  Past Medical History:  Diagnosis Date   Anemia    iron deficiency, due to fibroids   Arthritis    Bronchial asthma    Diabetes mellitus without complication (HCC)    Diastolic dysfunction    Grade 1 per echo Oct 2012; EF is normal.   Fibroid, uterus    with menorrhagia   GERD (gastroesophageal reflux disease)    Hyperlipidemia    Hypertension    Hypothyroidism    Motion sickness    Obesity    PVC's (premature ventricular contractions)    SVT  (supraventricular tachycardia)     Past Surgical History:  Procedure Laterality Date   CARPAL TUNNEL RELEASE     CARPAL TUNNEL RELEASE  2008   right hand. wears  brace on left   COLONOSCOPY WITH PROPOFOL N/A 09/05/2019   Procedure: COLONOSCOPY WITH PROPOFOL;  Surgeon: Lucilla Lame, MD;  Location: [place];  Service: Endoscopy;  Laterality: N/A;   TONSILLECTOMY AND ADENOIDECTOMY     TUBAL LIGATION       Current Outpatient Medications:    acetaminophen (TYLENOL) 650 MG CR tablet, Take 650 mg by mouth 2 (two) times daily., Disp: , Rfl:    amLODipine (NORVASC) 2.5 MG tablet, TAKE ONE TABLET BY MOUTH ONE TIME DAILY, Disp: 90 tablet, Rfl: 1   atorvastatin (LIPITOR) 10 MG tablet, Take 1 tablet (10 mg total) by mouth daily., Disp: 90 tablet, Rfl: 3   cholecalciferol (VITAMIN D) 1000 units tablet, Take 1,000 Units by mouth daily., Disp: , Rfl:    doxycycline (VIBRA-TABS) 100 MG tablet, Take 1 tablet (100 mg total) by mouth 2 (two) times daily., Disp: 20 tablet, Rfl: 0   ketoconazole (NIZORAL) 2 % shampoo, WASH SCALP 3-4 TIMES PER WEEK. LET SIT SEVERAL MINUTES BEFORE RINSING, Disp: , Rfl: 3   Melatonin 2.5 MG CHEW, Chew 1 tablet by mouth at bedtime., Disp: , Rfl:    metoprolol succinate (TOPROL-XL) 100 MG 24 hr tablet, TAKE ONE TABLET BY MOUTH ONE TIME DAILY WITH OR IMMEDIATELY FOLLOWING A MEAL, Disp: 90 tablet, Rfl: 1   montelukast (SINGULAIR) 10 MG tablet, TAKE ONE TABLET BY MOUTH ONE TIME DAILY AT BEDTIME, Disp:  90 tablet, Rfl: 1   Multiple Vitamin (MULTIVITAMIN) tablet, Take 1 tablet by mouth daily.  , Disp: , Rfl:    mupirocin ointment (BACTROBAN) 2 %, PLACE 1 APPLICATION INTO THE NOSE 2 (TWO) TIMES DAILY, Disp: 22 g, Rfl: 0   naproxen sodium (ALEVE) 220 MG tablet, Take 220 mg by mouth 2 (two) times daily., Disp: , Rfl:    omeprazole (PRILOSEC) 40 MG capsule, TAKE ONE CAPSULE BY MOUTH ONE TIME DAILY, Disp: 90 capsule, Rfl: 2   predniSONE (DELTASONE) 20 MG tablet, Take 2 tablets (40  mg total) by mouth daily with breakfast., Disp: 8 tablet, Rfl: 0   promethazine-dextromethorphan (PROMETHAZINE-DM) 6.25-15 MG/5ML syrup, Take 5 mLs by mouth 2 (two) times daily as needed for cough., Disp: 118 mL, Rfl: 0   spironolactone (ALDACTONE) 50 MG tablet, TAKE ONE TABLET BY MOUTH ONE TIME DAILY, Disp: 90 tablet, Rfl: 1  EXAM:  VITALS per patient if applicable:  GENERAL: alert, oriented, appears well and in no acute distress  HEENT: atraumatic, conjunttiva clear, no obvious abnormalities on inspection of external nose and ears  NECK: normal movements of the head and neck  LUNGS: on inspection no signs of respiratory distress, breathing rate appears normal, no obvious gross SOB, gasping or wheezing  CV: no obvious cyanosis  MS: moves all visible extremities without noticeable abnormality  PSYCH/NEURO: pleasant and cooperative, no obvious depression or anxiety, speech and thought processing grossly intact  ASSESSMENT AND PLAN:  Discussed the following assessment and plan:  Cough, unspecified type  Nasal congestion  Facial discomfort  -we discussed possible serious and likely etiologies, options for evaluation and workup, limitations of telemedicine visit vs in person visit, treatment, treatment risks and precautions. Pt is agreeable to treatment via telemedicine at this moment. Query sinusitis, possible bronchitis as well vs other. She feels the cough is the worst and is requesting prednisone along with abx as reports that has helped quite a bit with prior such symptoms in the past. Sent doxy and prednisone. She also wanted rx for cough medication as tessalon is not working. Sent. Discussed potential risks with each rx.   Advised to seek prompt virtual visit or in person care if worsening, new symptoms arise, or if is not improving with treatment as expected per our conversation of expected course. Discussed options for follow up care. Did let this patient know that I do  telemedicine on Tuesdays and Thursdays for [place] and those are the days I am logged into the system. Advised to schedule follow up visit with PCP, [place] virtual visits or [facility] if any further questions or concerns to avoid delays in care.   I discussed the assessment and treatment plan with the patient. The patient was provided an opportunity to ask questions and all were answered. The patient agreed with the plan and demonstrated an understanding of the instructions.     Lucretia Kern, DO

## 2022-10-07 ENCOUNTER — Encounter: Payer: Self-pay | Admitting: Family Medicine

## 2022-10-07 ENCOUNTER — Telehealth (INDEPENDENT_AMBULATORY_CARE_PROVIDER_SITE_OTHER): Payer: Medicare Other | Admitting: Family Medicine

## 2022-10-07 VITALS — Ht 69.0 in | Wt 279.0 lb

## 2022-10-07 DIAGNOSIS — R051 Acute cough: Secondary | ICD-10-CM | POA: Diagnosis not present

## 2022-10-07 DIAGNOSIS — R062 Wheezing: Secondary | ICD-10-CM

## 2022-10-07 MED ORDER — ALBUTEROL SULFATE HFA 108 (90 BASE) MCG/ACT IN AERS: 2.0000 | INHALATION_SPRAY | Freq: Four times a day (QID) | RESPIRATORY_TRACT | 0 refills | Status: DC | PRN

## 2022-10-07 MED ORDER — PROMETHAZINE-DM 6.25-15 MG/5ML PO SYRP: 5.0000 mL | ORAL_SOLUTION | Freq: Four times a day (QID) | ORAL | 0 refills | Status: DC | PRN

## 2022-10-07 NOTE — Progress Notes (Signed)
Virtual Visit via Video Note  I connected with Amy Petty  on 10/07/22 at  1:20 PM EST by a video enabled telemedicine application and verified that I am speaking with the correct person using two identifiers.  Location patient: [name] Location provider:work or home office Persons participating in the virtual visit: patient, provider  I discussed the limitations and requested verbal permission for telemedicine visit. The patient expressed understanding and agreed to proceed.   HPI:  Acute telemedicine visit for cough and congestion: -Onset: about 3 days ago -Symptoms include: nasal congestion, stopped up on one side of her nose - using nasocort the last few days, cough from PND, mild wheezing at times (reports always gets with resp illness as has asthma) -albuterol is expired so has not used - did multiple covid tests - all negative -Denies: fever, body ache, NVD, CP, SOB -Has tried:had leftover cough syrup from last illness but just ran out and requests refill as reports works better than OTC meds -Pertinent past medical history: see below -Pertinent medication allergies: Allergies  Allergen Reactions   Penicillins Swelling    Full body   Sulfonamide Derivatives Swelling    Full body   -COVID-19 vaccine status:  Immunization History  Administered Date(s) Administered   Fluad Quad(high Dose 65+) 08/25/2022   Influenza Inj Mdck Quad Pf 08/19/2019   Influenza Split 08/20/2011, 08/11/2012   Influenza,inj,Quad PF,6+ Mos 08/15/2013, 09/26/2014, 09/04/2015, 08/05/2016, 09/18/2017, 07/21/2018, 09/09/2021   PFIZER(Purple Top)SARS-COV-2 Vaccination 02/25/2020, 03/24/2020   PNEUMOCOCCAL CONJUGATE-20 04/14/2022   Tdap 08/20/2011     ROS: See pertinent positives and negatives per HPI.  Past Medical History:  Diagnosis Date   Anemia    iron deficiency, due to fibroids   Arthritis    Bronchial asthma    Diabetes mellitus without complication (HCC)    Diastolic dysfunction    Grade 1 per  echo Oct 2012; EF is normal.   Fibroid, uterus    with menorrhagia   GERD (gastroesophageal reflux disease)    Hyperlipidemia    Hypertension    Hypothyroidism    Motion sickness    Obesity    PVC's (premature ventricular contractions)    SVT (supraventricular tachycardia)     Past Surgical History:  Procedure Laterality Date   CARPAL TUNNEL RELEASE     CARPAL TUNNEL RELEASE  2008   right hand. wears  brace on left   COLONOSCOPY WITH PROPOFOL N/A 09/05/2019   Procedure: COLONOSCOPY WITH PROPOFOL;  Surgeon: Amy Lame, MD;  Location: [facility];  Service: Endoscopy;  Laterality: N/A;   TONSILLECTOMY AND ADENOIDECTOMY     TUBAL LIGATION       Current Outpatient Medications:    acetaminophen (TYLENOL) 650 MG CR tablet, Take 650 mg by mouth 2 (two) times daily., Disp: , Rfl:    albuterol (PROAIR HFA) 108 (90 Base) MCG/ACT inhaler, Inhale 2 puffs into the lungs every 6 (six) hours as needed for wheezing or shortness of breath., Disp: 1 each, Rfl: 0   amLODipine (NORVASC) 2.5 MG tablet, TAKE ONE TABLET BY MOUTH ONE TIME DAILY, Disp: 90 tablet, Rfl: 1   atorvastatin (LIPITOR) 10 MG tablet, Take 1 tablet (10 mg total) by mouth daily., Disp: 90 tablet, Rfl: 3   cholecalciferol (VITAMIN D) 1000 units tablet, Take 1,000 Units by mouth daily., Disp: , Rfl:    ketoconazole (NIZORAL) 2 % shampoo, WASH SCALP 3-4 TIMES PER WEEK. LET SIT SEVERAL MINUTES BEFORE RINSING, Disp: , Rfl: 3   Melatonin 2.5 MG  CHEW, Chew 1 tablet by mouth at bedtime., Disp: , Rfl:    metoprolol succinate (TOPROL-XL) 100 MG 24 hr tablet, TAKE ONE TABLET BY MOUTH ONE TIME DAILY WITH OR IMMEDIATELY FOLLOWING A MEAL, Disp: 90 tablet, Rfl: 1   montelukast (SINGULAIR) 10 MG tablet, TAKE ONE TABLET BY MOUTH ONE TIME DAILY AT BEDTIME, Disp: 90 tablet, Rfl: 1   Multiple Vitamin (MULTIVITAMIN) tablet, Take 1 tablet by mouth daily.  , Disp: , Rfl:    naproxen sodium (ALEVE) 220 MG tablet, Take 220 mg by mouth 2 (two)  times daily., Disp: , Rfl:    omeprazole (PRILOSEC) 40 MG capsule, TAKE ONE CAPSULE BY MOUTH ONE TIME DAILY, Disp: 90 capsule, Rfl: 2   promethazine-dextromethorphan (PROMETHAZINE-DM) 6.25-15 MG/5ML syrup, Take 5 mLs by mouth 4 (four) times daily as needed for cough., Disp: 118 mL, Rfl: 0   spironolactone (ALDACTONE) 50 MG tablet, TAKE ONE TABLET BY MOUTH ONE TIME DAILY, Disp: 90 tablet, Rfl: 1   triamcinolone (NASACORT ALLERGY 24HR) 55 MCG/ACT AERO nasal inhaler, Place 2 sprays into the nose at bedtime., Disp: , Rfl:   EXAM:  VITALS per patient if applicable:  GENERAL: alert, oriented, appears well and in no acute distress  HEENT: atraumatic, conjunttiva clear, no obvious abnormalities on inspection of external nose and ears  NECK: normal movements of the head and neck  LUNGS: on inspection no signs of respiratory distress, breathing rate appears normal, no obvious gross SOB, gasping or wheezing, coughing from time to time during visit  CV: no obvious cyanosis  MS: moves all visible extremities without noticeable abnormality  PSYCH/NEURO: pleasant and cooperative, no obvious depression or anxiety, speech and thought processing grossly intact  ASSESSMENT AND PLAN:  Discussed the following assessment and plan:  Acute cough  Wheeze  -we discussed possible serious and likely etiologies, options for evaluation and workup, limitations of telemedicine visit vs in person visit, treatment, treatment risks and precautions. Pt is agreeable to treatment via telemedicine at this moment. Suspect vrui with some possible bronchial involvement. She has opted to try nasal saline rinse, continue INS, albuterol, cough syrup rx.  Advised to seek prompt virtual visit or in person care if worsening, new symptoms arise, or if is not improving with treatment as expected per our conversation of expected course. Discussed options for follow up care. Did let this patient know that I do telemedicine on Tuesdays  and Thursdays for [place] and those are the days I am logged into the system. Advised to schedule follow up visit with PCP, [place] virtual visits or [facility] if any further questions or concerns to avoid delays in care.   I discussed the assessment and treatment plan with the patient. The patient was provided an opportunity to ask questions and all were answered. The patient agreed with the plan and demonstrated an understanding of the instructions.     Lucretia Kern, DO

## 2022-10-07 NOTE — Patient Instructions (Signed)
-  I sent the medication(s) we discussed to your pharmacy: Meds ordered this encounter  Medications   albuterol (PROAIR HFA) 108 (90 Base) MCG/ACT inhaler    Sig: Inhale 2 puffs into the lungs every 6 (six) hours as needed for wheezing or shortness of breath.    Dispense:  1 each    Refill:  0   promethazine-dextromethorphan (PROMETHAZINE-DM) 6.25-15 MG/5ML syrup    Sig: Take 5 mLs by mouth 4 (four) times daily as needed for cough.    Dispense:  118 mL    Refill:  0   Nasal saline rinse twice daily  Humidifier - clean properly  Stay hydrated  I hope you are feeling better soon!  Seek in person care promptly if your symptoms worsen, new concerns arise or you are not improving with treatment.  It was nice to meet you today. I help [facility] out with telemedicine visits on Tuesdays and Thursdays and am happy to help if you need a virtual follow up visit on those days. Otherwise, if you have any concerns or questions following this visit please schedule a follow up visit with your Primary Care office or seek care at a local urgent care clinic to avoid delays in care. If you are having severe or life threatening symptoms please call 911 and/or go to the nearest emergency room.

## 2022-10-22 ENCOUNTER — Other Ambulatory Visit: Payer: Self-pay

## 2022-10-22 MED ORDER — OMEPRAZOLE 40 MG PO CPDR: 40.0000 mg | DELAYED_RELEASE_CAPSULE | Freq: Every day | ORAL | 2 refills | Status: DC

## 2022-11-20 IMAGING — MG MM DIGITAL SCREENING BILAT W/ TOMO AND CAD
8 series · 8 of 24 positions shown · non-contrast
Comparison: Previous exam(s).

ACR Breast Density Category a: The breast tissue is almost entirely
fatty.

CLINICAL DATA: Screening.

EXAM:
DIGITAL SCREENING BILATERAL MAMMOGRAM WITH TOMOSYNTHESIS AND CAD
TECHNIQUE: Bilateral screening digital craniocaudal and mediolateral oblique
mammograms were obtained. Bilateral screening digital breast
tomosynthesis was performed. The images were evaluated with
computer-aided detection.

[L MLO synth-2D]
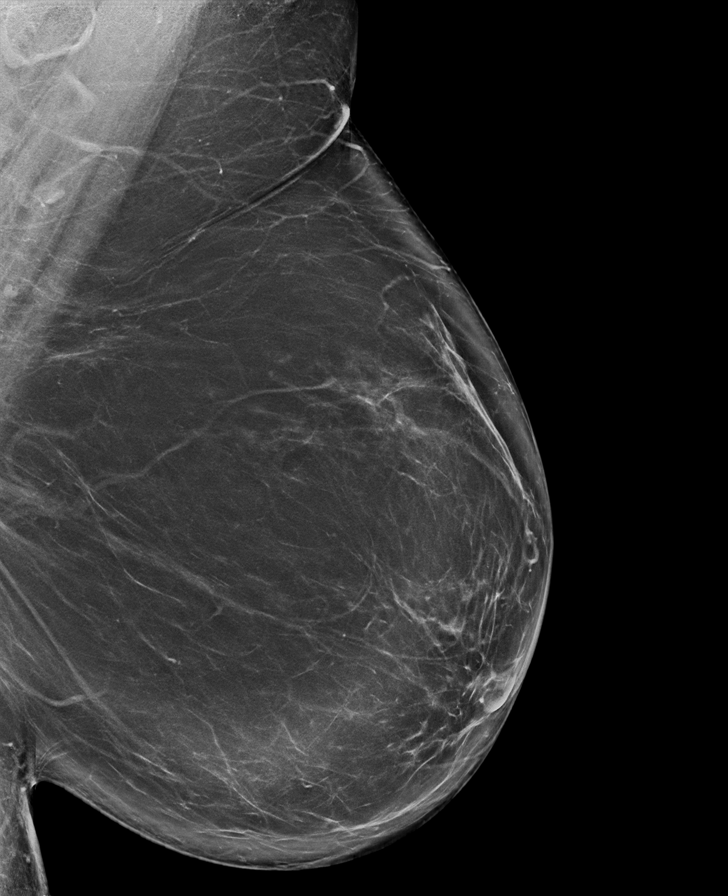

[R MLO synth-2D]
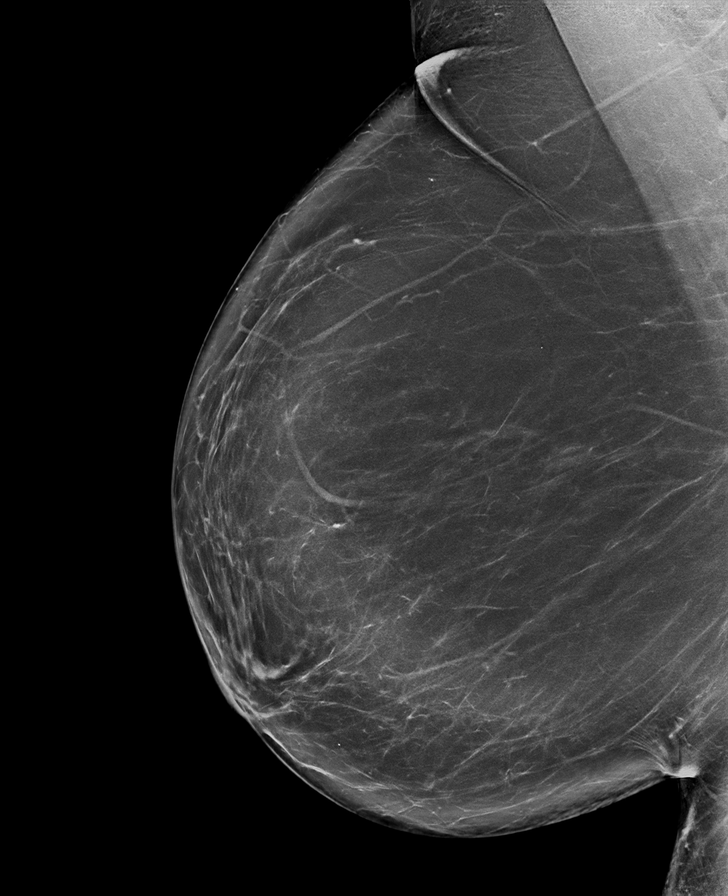

[R CC synth-2D]
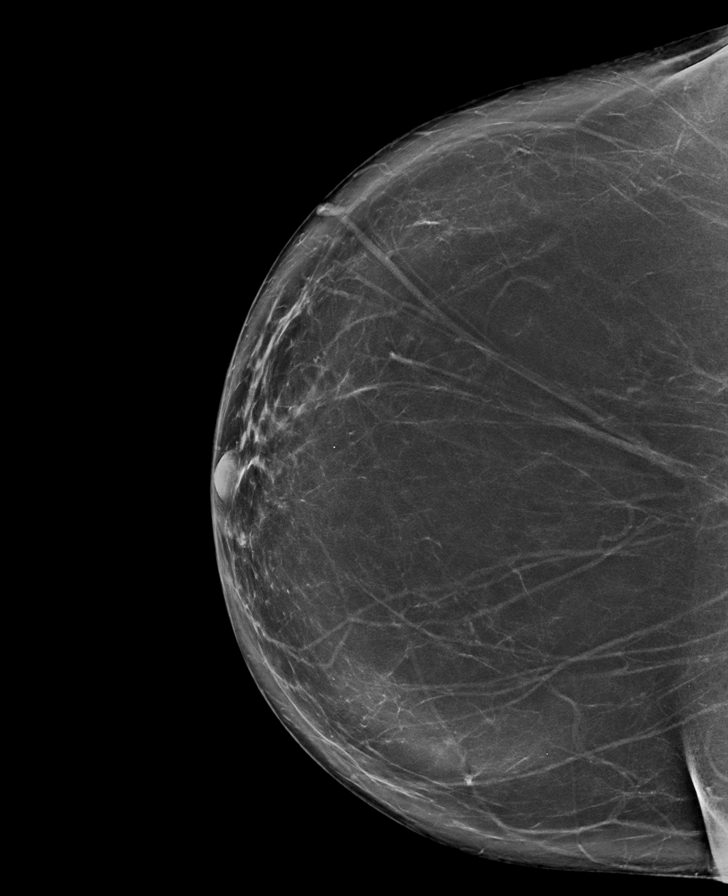

[L CC synth-2D]
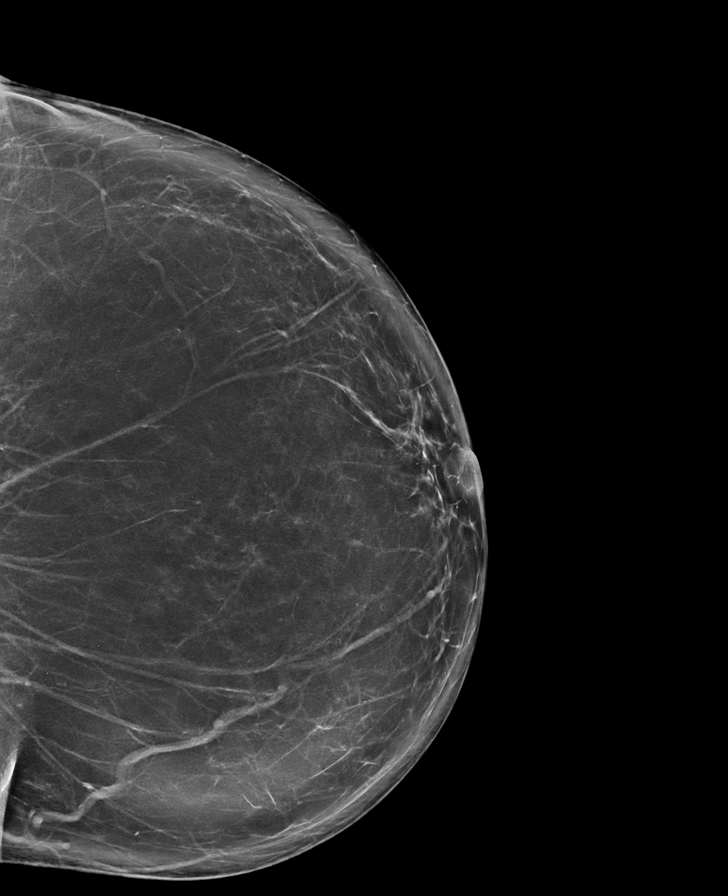

[L MLO tomo · tomo slice 51/101.0]
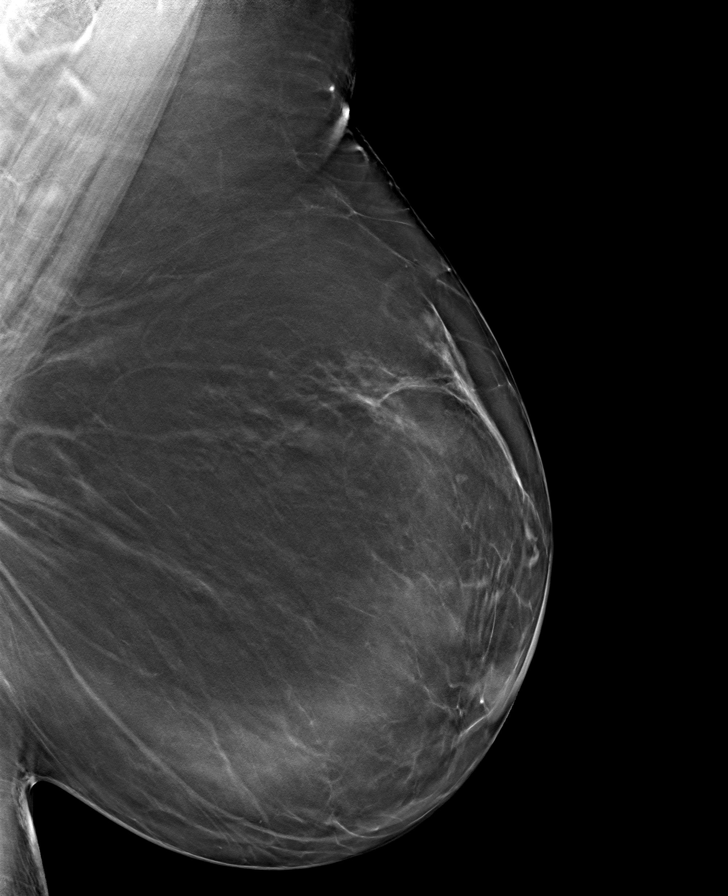

[R MLO tomo · tomo slice 53/104.0]
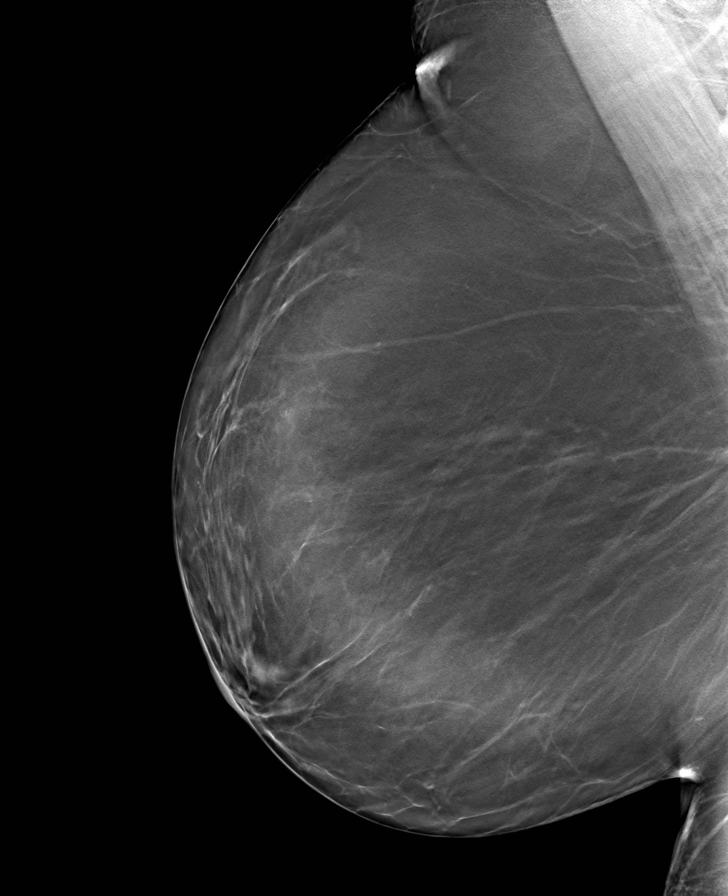

[R CC tomo · tomo slice 49/96.0]
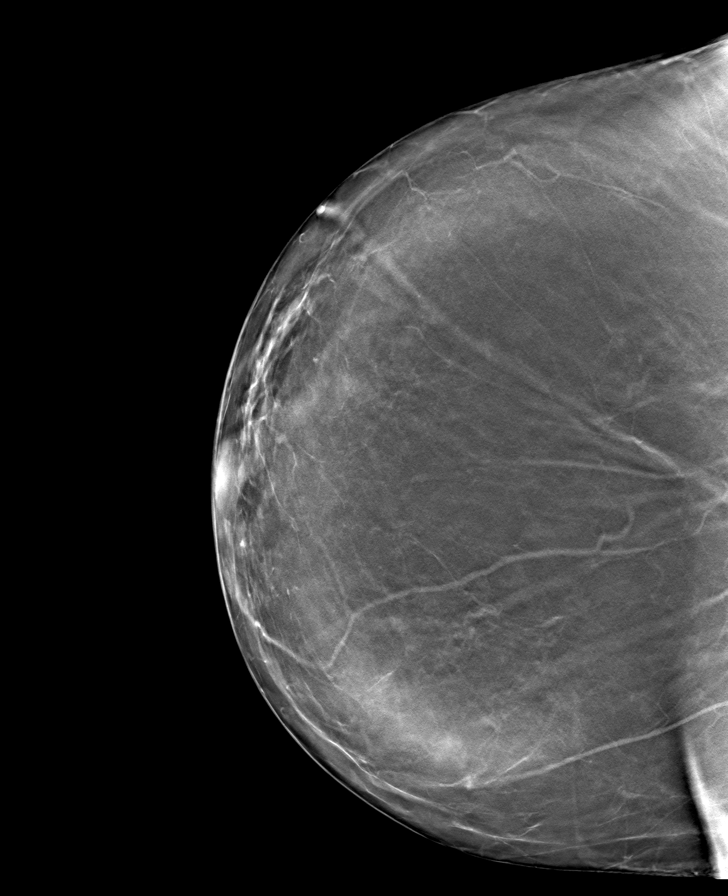

[L CC tomo · tomo slice 47/93.0]
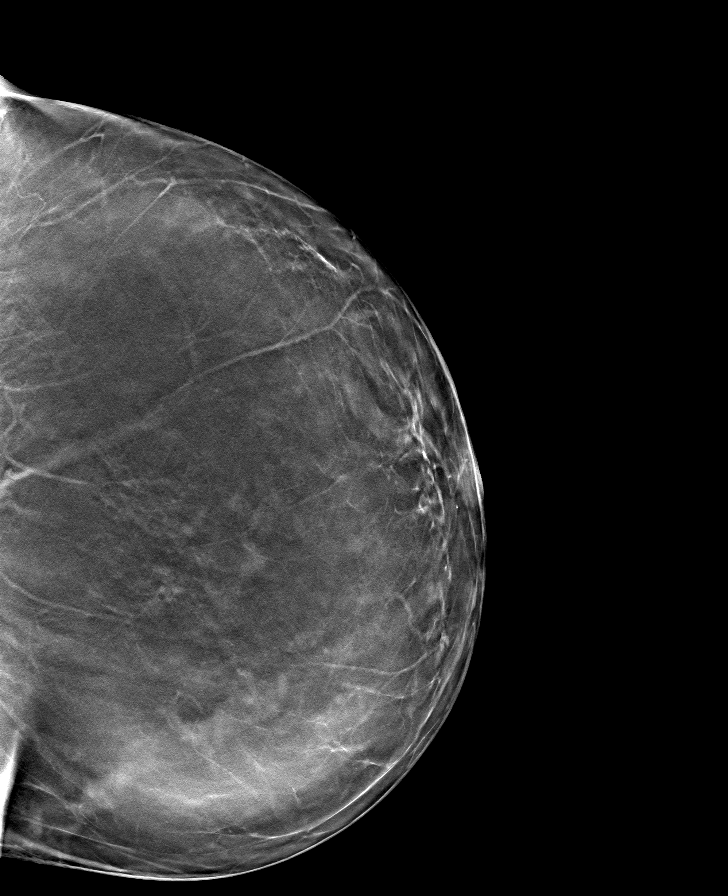

[8 of 24 positions shown; findings below may reference images not displayed]

FINDINGS: There are no findings suspicious for malignancy.
IMPRESSION: No mammographic evidence of malignancy. A result letter of this
screening mammogram will be mailed directly to the patient.

RECOMMENDATION:
Screening mammogram in one year. (Code:0E-3-N98)

BI-RADS CATEGORY  1: Negative.

## 2022-12-04 ENCOUNTER — Other Ambulatory Visit: Payer: Self-pay | Admitting: Internal Medicine

## 2022-12-24 ENCOUNTER — Encounter: Payer: Self-pay | Admitting: Dermatology

## 2022-12-24 ENCOUNTER — Ambulatory Visit (INDEPENDENT_AMBULATORY_CARE_PROVIDER_SITE_OTHER): Payer: Medicare Other | Admitting: Dermatology

## 2022-12-24 ENCOUNTER — Other Ambulatory Visit: Payer: Self-pay | Admitting: Dermatology

## 2022-12-24 VITALS — BP 138/83 | HR 77

## 2022-12-24 DIAGNOSIS — C44311 Basal cell carcinoma of skin of nose: Secondary | ICD-10-CM

## 2022-12-24 DIAGNOSIS — Z85828 Personal history of other malignant neoplasm of skin: Secondary | ICD-10-CM

## 2022-12-24 DIAGNOSIS — L570 Actinic keratosis: Secondary | ICD-10-CM | POA: Diagnosis not present

## 2022-12-24 DIAGNOSIS — D492 Neoplasm of unspecified behavior of bone, soft tissue, and skin: Secondary | ICD-10-CM

## 2022-12-24 DIAGNOSIS — L578 Other skin changes due to chronic exposure to nonionizing radiation: Secondary | ICD-10-CM | POA: Diagnosis not present

## 2022-12-24 NOTE — Progress Notes (Signed)
New Patient Visit  Subjective  Amy Petty is a 67 y.o. female who presents for the following: check spots (L nose, 46m hard feeling spot/Dry patches face, off and on).  The patient has spots, moles and lesions to be evaluated, some may be new or changing and the patient has concerns that these could be cancer.   The following portions of the chart were reviewed this encounter and updated as appropriate:       Review of Systems:  No other skin or systemic complaints except as noted in HPI or Assessment and Plan.  Objective  Well appearing patient in no apparent distress; mood and affect are within normal limits.  A focused examination was performed including face. Relevant physical exam findings are noted in the Assessment and Plan.  L ant nasal ala 1.0cm pink slightly pearly pap     R malar cheek x 2, R paranasal x 1, R nasal tip x 1, R nasal dorsum x 1, mid forehead x 5, L lat eyebrow x 2, Frontal scalp hairline x 1 (13) Pink scaly macules    Assessment & Plan   Actinic Damage - chronic, secondary to cumulative UV radiation exposure/sun exposure over time - diffuse scaly erythematous macules with underlying dyspigmentation - Recommend daily broad spectrum sunscreen SPF 30+ to sun-exposed areas, reapply every 2 hours as needed.  - Recommend staying in the shade or wearing long sleeves, sun glasses (UVA+UVB protection) and wide brim hats (4-inch brim around the entire circumference of the hat). - Call for new or changing lesions.   History of Basal Cell Carcinoma of the Skin - No evidence of recurrence today - Recommend regular full body skin exams - Recommend daily broad spectrum sunscreen SPF 30+ to sun-exposed areas, reapply every 2 hours as needed.  - Call if any new or changing lesions are noted between office visits   Neoplasm of skin L ant nasal ala  Skin / nail biopsy Type of biopsy: tangential   Informed consent: discussed and consent obtained    Anesthesia: the lesion was anesthetized in a standard fashion   Anesthesia comment:  Area prepped with alcohol Anesthetic:  1% lidocaine w/ epinephrine 1-100,000 buffered w/ 8.4% NaHCO3 Instrument used: flexible razor blade   Hemostasis achieved with: pressure, aluminum chloride and electrodesiccation   Outcome: patient tolerated procedure well   Post-procedure details: wound care instructions given   Post-procedure details comment:  Ointment and small bandage applied  Specimen 1 - Surgical pathology Differential Diagnosis: Sebaceous Hyperplasia r/o BCC  Check Margins: No 1.0cm pink slightly pearly pap  Sebaceous hyperplasia r/o BCC  [facility]  AK (actinic keratosis) (13) R malar cheek x 2, R paranasal x 1, R nasal tip x 1, R nasal dorsum x 1, mid forehead x 5, L lat eyebrow x 2, Frontal scalp hairline x 1  Actinic keratoses are precancerous spots that appear secondary to cumulative UV radiation exposure/sun exposure over time. They are chronic with expected duration over 1 year. A portion of actinic keratoses will progress to squamous cell carcinoma of the skin. It is not possible to reliably predict which spots will progress to skin cancer and so treatment is recommended to prevent development of skin cancer.  Recommend daily broad spectrum sunscreen SPF 30+ to sun-exposed areas, reapply every 2 hours as needed.  Recommend staying in the shade or wearing long sleeves, sun glasses (UVA+UVB protection) and wide brim hats (4-inch brim around the  entire circumference of the hat). Call for new or changing lesions.   Destruction of lesion - R malar cheek x 2, R paranasal x 1, R nasal tip x 1, R nasal dorsum x 1, mid forehead x 5, L lat eyebrow x 2, Frontal scalp hairline x 1  Destruction method: cryotherapy   Informed consent: discussed and consent obtained   Lesion destroyed using liquid nitrogen: Yes   Region frozen until ice ball extended beyond  lesion: Yes   Outcome: patient tolerated procedure well with no complications   Post-procedure details: wound care instructions given   Additional details:  Prior to procedure, discussed risks of blister formation, small wound, skin dyspigmentation, or rare scar following cryotherapy. Recommend Vaseline ointment to treated areas while healing.    Return in about 3 months (around 03/24/2023) for AK f/u, TBSE, Hx of BCC.   I, Othelia Pulling, RMA, am acting as scribe for Brendolyn Patty, MD .  Documentation: I have reviewed the above documentation for accuracy and completeness, and I agree with the above.  Brendolyn Patty MD

## 2022-12-24 NOTE — Patient Instructions (Addendum)
Wound Care Instructions  Cleanse wound gently with soap and water once a day then pat dry with clean gauze. Apply a thin coat of Petrolatum (petroleum jelly, "Vaseline") over the wound (unless you have an allergy to this). We recommend that you use a new, sterile tube of Vaseline. Do not pick or remove scabs. Do not remove the yellow or white "healing tissue" from the base of the wound.  Cover the wound with fresh, clean, nonstick gauze and secure with paper tape. You may use Band-Aids in place of gauze and tape if the wound is small enough, but would recommend trimming much of the tape off as there is often too much. Sometimes Band-Aids can irritate the skin.  You should call the office for your biopsy report after 1 week if you have not already been contacted.  If you experience any problems, such as abnormal amounts of bleeding, swelling, significant bruising, significant pain, or evidence of infection, please call the office immediately.  FOR ADULT SURGERY PATIENTS: If you need something for pain relief you may take 1 extra strength Tylenol (acetaminophen) AND 2 Ibuprofen (200mg each) together every 4 hours as needed for pain. (do not take these if you are allergic to them or if you have a reason you should not take them.) Typically, you may only need pain medication for 1 to 3 days.     Cryotherapy Aftercare  Wash gently with soap and water everyday.   Apply Vaseline and Band-Aid daily until healed.    Due to recent changes in healthcare laws, you may see results of your pathology and/or laboratory studies on MyChart before the doctors have had a chance to review them. We understand that in some cases there may be results that are confusing or concerning to you. Please understand that not all results are received at the same time and often the doctors may need to interpret multiple results in order to provide you with the best plan of care or course of treatment. Therefore, we ask that you  please give us 2 business days to thoroughly review all your results before contacting the office for clarification. Should we see a critical lab result, you will be contacted sooner.   If You Need Anything After Your Visit  If you have any questions or concerns for your doctor, please call our main line at [phone] and press option 4 to reach your doctor's medical assistant. If no one answers, please leave a voicemail as directed and we will return your call as soon as possible. Messages left after 4 pm will be answered the following business day.   You may also send us a message via MyChart. We typically respond to MyChart messages within 1-2 business days.  For prescription refills, please ask your pharmacy to contact our office. Our fax number is [phone].  If you have an urgent issue when the clinic is closed that cannot wait until the next business day, you can page your doctor at the number below.    Please note that while we do our best to be available for urgent issues outside of office hours, we are not available 24/7.   If you have an urgent issue and are unable to reach us, you may choose to seek medical care at your doctor's office, retail clinic, urgent care center, or emergency room.  If you have a medical emergency, please immediately call 911 or go to the emergency department.  Pager Numbers  - Dr. Kowalski: [phone]  -   Dr. Moye: [phone]  - Dr. Stewart: [phone]  In the event of inclement weather, please call our main line at [phone] for an update on the status of any delays or closures.  Dermatology Medication Tips: Please keep the boxes that topical medications come in in order to help keep track of the instructions about where and how to use these. Pharmacies typically print the medication instructions only on the boxes and not directly on the medication tubes.   If your medication is too expensive, please contact our office at  [phone] option 4 or send us a message through MyChart.   We are unable to tell what your co-pay for medications will be in advance as this is different depending on your insurance coverage. However, we may be able to find a substitute medication at lower cost or fill out paperwork to get insurance to cover a needed medication.   If a prior authorization is required to get your medication covered by your insurance company, please allow us 1-2 business days to complete this process.  Drug prices often vary depending on where the prescription is filled and some pharmacies may offer cheaper prices.  The website www.goodrx.com contains coupons for medications through different pharmacies. The prices here do not account for what the cost may be with help from insurance (it may be cheaper with your insurance), but the website can give you the price if you did not use any insurance.  - You can print the associated coupon and take it with your prescription to the pharmacy.  - You may also stop by our office during regular business hours and pick up a GoodRx coupon card.  - If you need your prescription sent electronically to a different pharmacy, notify our office through [facility] MyChart or by phone at [phone] option 4.     Si Usted Necesita Algo Despus de Su Visita  Tambin puede enviarnos un mensaje a travs de MyChart. Por lo general respondemos a los mensajes de MyChart en el transcurso de 1 a 2 das hbiles.  Para renovar recetas, por favor pida a su farmacia que se ponga en contacto con nuestra oficina. Nuestro nmero de fax es el [phone].  Si tiene un asunto urgente cuando la clnica est cerrada y que no puede esperar hasta el siguiente da hbil, puede llamar/localizar a su doctor(a) al nmero que aparece a continuacin.   Por favor, tenga en cuenta que aunque hacemos todo lo posible para estar disponibles para asuntos urgentes fuera del horario de oficina, no estamos  disponibles las 24 horas del da, los 7 das de la semana.   Si tiene un problema urgente y no puede comunicarse con nosotros, puede optar por buscar atencin mdica  en el consultorio de su doctor(a), en una clnica privada, en un centro de atencin urgente o en una sala de emergencias.  Si tiene una emergencia mdica, por favor llame inmediatamente al 911 o vaya a la sala de emergencias.  Nmeros de bper  - Dr. Kowalski: [phone]  - Dra. Moye: [phone]  - Dra. Stewart: [phone]  En caso de inclemencias del tiempo, por favor llame a nuestra lnea principal al [phone] para una actualizacin sobre el estado de cualquier retraso o cierre.  Consejos para la medicacin en dermatologa: Por favor, guarde las cajas en las que vienen los medicamentos de uso tpico para ayudarle a seguir las instrucciones sobre dnde y cmo usarlos. Las farmacias generalmente imprimen las instrucciones del medicamento slo en las cajas y   no directamente en los tubos del medicamento.   Si su medicamento es muy caro, por favor, pngase en contacto con nuestra oficina llamando al [phone] y presione la opcin 4 o envenos un mensaje a travs de MyChart.   No podemos decirle cul ser su copago por los medicamentos por adelantado ya que esto es diferente dependiendo de la cobertura de su seguro. Sin embargo, es posible que podamos encontrar un medicamento sustituto a menor costo o llenar un formulario para que el seguro cubra el medicamento que se considera necesario.   Si se requiere una autorizacin previa para que su compaa de seguros cubra su medicamento, por favor permtanos de 1 a 2 das hbiles para completar este proceso.  Los precios de los medicamentos varan con frecuencia dependiendo del lugar de dnde se surte la receta y alguna farmacias pueden ofrecer precios ms baratos.  El sitio web www.goodrx.com tiene cupones para medicamentos de diferentes farmacias. Los precios aqu no  tienen en cuenta lo que podra costar con la ayuda del seguro (puede ser ms barato con su seguro), pero el sitio web puede darle el precio si no utiliz ningn seguro.  - Puede imprimir el cupn correspondiente y llevarlo con su receta a la farmacia.  - Tambin puede pasar por nuestra oficina durante el horario de atencin regular y recoger una tarjeta de cupones de GoodRx.  - Si necesita que su receta se enve electrnicamente a una farmacia diferente, informe a nuestra oficina a travs de MyChart de [facility] o por telfono llamando al [phone] y presione la opcin 4.  

## 2022-12-29 ENCOUNTER — Telehealth: Payer: Self-pay

## 2022-12-29 DIAGNOSIS — C44311 Basal cell carcinoma of skin of nose: Secondary | ICD-10-CM

## 2022-12-29 NOTE — Telephone Encounter (Signed)
-----   Message from Brendolyn Patty, MD sent at 12/29/2022  5:03 PM EST ----- Skin , left ant nasal ala BASAL CELL CARCINOMA, SUPERFICIAL AND NODULAR PATTERNS  BCC skin cancer, needs Mohs surgery with Dr. Manley Mason at [facility]   - please call patient

## 2022-12-29 NOTE — Telephone Encounter (Signed)
Advised pt of bx results and referral sent to Dr. Manley Mason at [facility]./sh

## 2022-12-30 ENCOUNTER — Encounter: Payer: Self-pay | Admitting: Internal Medicine

## 2022-12-30 ENCOUNTER — Ambulatory Visit (INDEPENDENT_AMBULATORY_CARE_PROVIDER_SITE_OTHER): Payer: Medicare Other | Admitting: Internal Medicine

## 2022-12-30 VITALS — BP 118/76 | HR 71 | Temp 97.5°F | Ht 69.0 in | Wt 287.8 lb

## 2022-12-30 DIAGNOSIS — M25511 Pain in right shoulder: Secondary | ICD-10-CM | POA: Diagnosis not present

## 2022-12-30 DIAGNOSIS — I10 Essential (primary) hypertension: Secondary | ICD-10-CM

## 2022-12-30 DIAGNOSIS — E1159 Type 2 diabetes mellitus with other circulatory complications: Secondary | ICD-10-CM

## 2022-12-30 DIAGNOSIS — I152 Hypertension secondary to endocrine disorders: Secondary | ICD-10-CM

## 2022-12-30 DIAGNOSIS — E1169 Type 2 diabetes mellitus with other specified complication: Secondary | ICD-10-CM

## 2022-12-30 DIAGNOSIS — E669 Obesity, unspecified: Secondary | ICD-10-CM

## 2022-12-30 LAB — COMPREHENSIVE METABOLIC PANEL
ALT: 18 U/L (ref 0–35)
AST: 17 U/L (ref 0–37)
Albumin: 4.3 g/dL (ref 3.5–5.2)
Alkaline Phosphatase: 104 U/L (ref 39–117)
BUN: 18 mg/dL (ref 6–23)
CO2: 31 mEq/L (ref 19–32)
Calcium: 10.2 mg/dL (ref 8.4–10.5)
Chloride: 100 mEq/L (ref 96–112)
Creatinine, Ser: 0.64 mg/dL (ref 0.40–1.20)
GFR: 92.49 mL/min (ref >60.00)
Glucose, Bld: 94 mg/dL (ref 70–99)
Potassium: 4.6 mEq/L (ref 3.5–5.1)
Sodium: 140 mEq/L (ref 135–145)
Total Bilirubin: 0.5 mg/dL (ref 0.2–1.2)
Total Protein: 7.2 g/dL (ref 6.0–8.3)

## 2022-12-30 LAB — HEMOGLOBIN A1C: Hgb A1c MFr Bld: 6.5 % (ref 4.6–6.5)

## 2022-12-30 MED ORDER — MONTELUKAST SODIUM 10 MG PO TABS: ORAL_TABLET | ORAL | 1 refills | Status: DC

## 2022-12-30 MED ORDER — SPIRONOLACTONE 50 MG PO TABS: 50.0000 mg | ORAL_TABLET | Freq: Every day | ORAL | 1 refills | Status: DC

## 2022-12-30 MED ORDER — GABAPENTIN 300 MG PO CAPS: 300.0000 mg | ORAL_CAPSULE | Freq: Three times a day (TID) | ORAL | 0 refills | Status: DC

## 2022-12-30 MED ORDER — MUPIROCIN 2 % EX OINT: 1.0000 | TOPICAL_OINTMENT | Freq: Two times a day (BID) | CUTANEOUS | 0 refills | Status: DC

## 2022-12-30 MED ORDER — METOPROLOL SUCCINATE ER 100 MG PO TB24: ORAL_TABLET | ORAL | 1 refills | Status: DC

## 2022-12-30 NOTE — Assessment & Plan Note (Signed)
I have addressed  BMI and recommended wt loss of 10% of body weigh over the next 6 months using a low glycemic index diet and regular exercise a minimum of 5 days per week.  REVIEWED  The risks and benefits of GLP 1 agonist therapy  She has no contraindications b ut has deferred for now.

## 2022-12-30 NOTE — Assessment & Plan Note (Signed)
Evaluated by orthopedics and pain has improved with gabapentin . Refill given

## 2022-12-30 NOTE — Progress Notes (Signed)
Subjective:  Patient ID: Amy Petty, female    DOB: December 09, 1955  Age: 67 y.o. MRN: [id number]  CC: The primary encounter diagnosis was Obesity, diabetes, and hypertension syndrome ([place]). Diagnoses of Morbid obesity ([place]), Acute pain of right shoulder, and Primary hypertension were also pertinent to this visit.   HPI [name] presents for  Chief Complaint  Patient presents with   Medical Management of Chronic Issues    Follow up on diabetes   1) type 2 DM with obesity and hypertension.  DM  diagnosed in June 2023 with a1c 6.5.  she has deferred treatment until now.  .  Most recent A1c 6.6 .  Weight gain of 4 lbs sinc e October attributed to holiday eating and husband's dietary intolerances .  Patient is taking her antihypertensive medications as prescribed and notes no adverse effects.  Home BP readings have been done about once per week and are  generally < 130/80 .  She is avoiding added salt in her diet but not walking regularly exercising    2) recent use of gabapentin for right shoulder pain using 300 mg qhs has helped all joint pain and restless legs.  Orthopedist has  deferred ongoing refills to me     Outpatient Medications Prior to Visit  Medication Sig Dispense Refill   acetaminophen (TYLENOL) 650 MG CR tablet Take 650 mg by mouth 2 (two) times daily.     albuterol (PROAIR HFA) 108 (90 Base) MCG/ACT inhaler Inhale 2 puffs into the lungs every 6 (six) hours as needed for wheezing or shortness of breath. 1 each 0   amLODipine (NORVASC) 2.5 MG tablet TAKE ONE TABLET BY MOUTH ONE TIME DAILY 90 tablet 1   atorvastatin (LIPITOR) 10 MG tablet Take 1 tablet (10 mg total) by mouth daily. 90 tablet 3   cholecalciferol (VITAMIN D) 1000 units tablet Take 1,000 Units by mouth daily.     ketoconazole (NIZORAL) 2 % shampoo WASH SCALP 3-4 TIMES PER WEEK. LET SIT SEVERAL MINUTES BEFORE RINSING  3   Melatonin 2.5 MG CHEW Chew 1 tablet by mouth at bedtime.     Multiple Vitamin (MULTIVITAMIN)  tablet Take 1 tablet by mouth daily.       naproxen sodium (ALEVE) 220 MG tablet Take 220 mg by mouth 2 (two) times daily.     omeprazole (PRILOSEC) 40 MG capsule Take 1 capsule (40 mg total) by mouth daily. 90 capsule 2   promethazine-dextromethorphan (PROMETHAZINE-DM) 6.25-15 MG/5ML syrup Take 5 mLs by mouth 4 (four) times daily as needed for cough. 118 mL 0   triamcinolone (NASACORT ALLERGY 24HR) 55 MCG/ACT AERO nasal inhaler Place 2 sprays into the nose at bedtime.     metoprolol succinate (TOPROL-XL) 100 MG 24 hr tablet TAKE ONE TABLET BY MOUTH ONE TIME DAILY WITH OR IMMEDIATELY FOLLOWING A MEAL 90 tablet 1   montelukast (SINGULAIR) 10 MG tablet TAKE ONE TABLET BY MOUTH ONE TIME DAILY AT BEDTIME 90 tablet 1   spironolactone (ALDACTONE) 50 MG tablet TAKE ONE TABLET BY MOUTH ONE TIME DAILY 90 tablet 1   No facility-administered medications prior to visit.    Review of Systems;  Patient denies headache, fevers, malaise, unintentional weight loss, skin rash, eye pain, sinus congestion and sinus pain, sore throat, dysphagia,  hemoptysis , cough, dyspnea, wheezing, chest pain, palpitations, orthopnea, edema, abdominal pain, nausea, melena, diarrhea, constipation, flank pain, dysuria, hematuria, urinary  Frequency, nocturia, numbness, tingling, seizures,  Focal weakness, Loss of consciousness,  Tremor, insomnia, depression, anxiety, and suicidal ideation.      Objective:  BP 118/76   Pulse 71   Temp (!) 97.5 F (36.4 C) (Oral)   Ht 5' 9"$  (1.753 m)   Wt 287 lb 12.8 oz (130.5 kg)   LMP 11/23/2013   SpO2 96%   BMI 42.50 kg/m   BP Readings from Last 3 Encounters:  12/30/22 118/76  12/24/22 138/83  08/25/22 114/66    Wt Readings from Last 3 Encounters:  12/30/22 287 lb 12.8 oz (130.5 kg)  10/07/22 279 lb (126.6 kg)  09/18/22 283 lb (128.4 kg)    Physical Exam Vitals reviewed.  Constitutional:      General: She is not in acute distress.    Appearance: Normal appearance. She is  normal weight. She is not ill-appearing, toxic-appearing or diaphoretic.  HENT:     Head: Normocephalic.  Eyes:     General: No scleral icterus.       Right eye: No discharge.        Left eye: No discharge.     Conjunctiva/sclera: Conjunctivae normal.  Cardiovascular:     Rate and Rhythm: Normal rate and regular rhythm.     Heart sounds: Normal heart sounds.  Pulmonary:     Effort: Pulmonary effort is normal. No respiratory distress.     Breath sounds: Normal breath sounds.  Musculoskeletal:        General: Normal range of motion.  Skin:    General: Skin is warm and dry.  Neurological:     General: No focal deficit present.     Mental Status: She is alert and oriented to person, place, and time. Mental status is at baseline.  Psychiatric:        Mood and Affect: Mood normal.        Behavior: Behavior normal.        Thought Content: Thought content normal.        Judgment: Judgment normal.   Lab Results  Component Value Date   HGBA1C 6.5 12/30/2022   HGBA1C 6.6 (H) 08/21/2022   HGBA1C 6.5 04/14/2022    Lab Results  Component Value Date   CREATININE 0.64 12/30/2022   CREATININE 0.73 08/21/2022   CREATININE 0.66 04/14/2022    Lab Results  Component Value Date   WBC 8.5 04/14/2022   HGB 13.8 04/14/2022   HCT 42.2 04/14/2022   PLT 282.0 04/14/2022   GLUCOSE 94 12/30/2022   CHOL 159 08/21/2022   TRIG 100.0 08/21/2022   HDL 53.30 08/21/2022   LDLCALC 86 08/21/2022   ALT 18 12/30/2022   AST 17 12/30/2022   NA 140 12/30/2022   K 4.6 12/30/2022   CL 100 12/30/2022   CREATININE 0.64 12/30/2022   BUN 18 12/30/2022   CO2 31 12/30/2022   TSH 2.75 04/14/2022   HGBA1C 6.5 12/30/2022   MICROALBUR <0.7 04/14/2022    DG FACET JT INJ L /S SINGLE LEVEL RIGHT W/FL/CT  Result Date: 06/17/2022 CLINICAL DATA:  67 year old female with chronic right-sided low back pain. She presents for right L4-L5 facet injection. EXAM: LEFT L4-5 FACET INJECTION UNDER FLUOROSCOPY TECHNIQUE:  Overlying skin prepped with Betadine, draped in the usual sterile fashion, and infiltrated locally with buffered Lidocaine. Curved 22 gauge spinal needle advanced to the posterior aspect of the right L4-5 facet. 80 mg Depo-Medrol and 2 ml 1% lidocaine was then administered. No immediate complication. FLUOROSCOPY: Radiation Exposure Index (as provided by the fluoroscopic device): 4.8 mGy Kerma IMPRESSION: Technically  successful right L4-5 facet injection under fluoroscopy. Electronically Signed   By: Jacqulynn Cadet M.D.   On: 06/17/2022 14:00    Assessment & Plan:  .Obesity, diabetes, and hypertension syndrome ([place]) Assessment & Plan: Discussed ways to maintain a low glycemic index  while cooking for husband with dietary restrictions .  Defer use of metformin and GLP 1 agonist for 3 month follow up   Lab Results  Component Value Date   HGBA1C 6.5 12/30/2022   Lab Results  Component Value Date   MICROALBUR <0.7 04/14/2022   MICROALBUR <0.7 11/24/2018     Lab Results  Component Value Date   CREATININE 0.64 12/30/2022     Orders: -     Hemoglobin A1c -     Comprehensive metabolic panel  Morbid obesity ([place]) Assessment & Plan: I have addressed  BMI and recommended wt loss of 10% of body weigh over the next 6 months using a low glycemic index diet and regular exercise a minimum of 5 days per week.  REVIEWED  The risks and benefits of GLP 1 agonist therapy  She has no contraindications b ut has deferred for now.    Acute pain of right shoulder Assessment & Plan: Evaluated by orthopedics and pain has improved with gabapentin . Refill given    Primary hypertension Assessment & Plan: Well controlled on amlodipine, metoprolol and spironolactone   Other orders -     Metoprolol Succinate ER; TAKE ONE TABLET BY MOUTH ONE TIME DAILY WITH OR IMMEDIATELY FOLLOWING A MEAL  Dispense: 90 tablet; Refill: 1 -     Montelukast Sodium; TAKE ONE TABLET BY MOUTH ONE TIME DAILY AT BEDTIME   Dispense: 90 tablet; Refill: 1 -     Spironolactone; Take 1 tablet (50 mg total) by mouth daily.  Dispense: 90 tablet; Refill: 1 -     Gabapentin; Take 1 capsule (300 mg total) by mouth 3 (three) times daily.  Dispense: 270 capsule; Refill: 0 -     Mupirocin; Apply 1 Application topically 2 (two) times daily. To eyebrows  Dispense: 22 g; Refill: 0     I provided 40 minutes of face-to-face time during this encounter reviewing patient's last visit with me, patient's  most recent visit with orthopedics, recent surgical and non surgical procedures, previous  labs and imaging studies, counseling on currently addressed issues,  and post visit ordering to diagnostics and therapeutics .   Follow-up: Return in about 3 months (around 03/30/2023).   Crecencio Mc, MD

## 2022-12-30 NOTE — Assessment & Plan Note (Signed)
Well controlled on amlodipine, metoprolol and spironolactone

## 2022-12-30 NOTE — Assessment & Plan Note (Addendum)
Discussed ways to maintain a low glycemic index  while cooking for husband with dietary restrictions .  Defer use of metformin and GLP 1 agonist for 3 month follow up .  BP is well controlled on   Lab Results  Component Value Date   HGBA1C 6.5 12/30/2022   Lab Results  Component Value Date   MICROALBUR <0.7 04/14/2022   MICROALBUR <0.7 11/24/2018     Lab Results  Component Value Date   CREATININE 0.64 12/30/2022

## 2022-12-30 NOTE — Patient Instructions (Addendum)
  Healthy Choice low carb power bowl entrees and FITKITCHEN cauliflower pizza bowls are great low carb entrees that microwave in 5 minutes     Veggies Made Great ! Makes a low carb spinach & egg white frittata    Danton Clap now makes a frozen breakfast frittata that can be microwaved in 2 minutes and is very low carb. Frittats are similar to quiches without the crust   Just crack n egg ([employer])  :  easy microwaveable souffle .     To make a low carb chip :  Take the Joseph's Lavash or Pita bread,  Or the [place] Low carb whole wheat tortilla   Place on metal cookie sheet  Brush with olive oil  Sprinkle garlic powder (NOT garlic salt), grated parmesan cheese, mediterranean seasoning , or all of them?  Bake at 275 for 30 minutes   We have substitutions for your potatoes!!  Try the mashed cauliflower and riced cauliflower dishes instead of rice and mashed potatoes  Mashed turnips are also very low carb!   For desserts :  Try the Dannon Lt n Fit greek yogurt dessert flavors and top with reddi Whip .  8 carbs,  80 calories  Try Oikos Triple Zero [place] Yogurt in the salted caramel, and the coffee flavors  With Whipped Cream for dessert  breyer's low carb ice cream, available in bars (on a stick, better ) or scoopable ice cream  HERE ARE THE LOW CARB  BREAD CHOICES

## 2023-01-15 ENCOUNTER — Encounter: Payer: Self-pay | Admitting: Radiology

## 2023-02-10 ENCOUNTER — Telehealth: Payer: Self-pay | Admitting: Internal Medicine

## 2023-02-10 NOTE — Telephone Encounter (Signed)
Called patient to schedule Medicare Annual Wellness Visit (AWV). Left message for patient to call back and schedule Medicare Annual Wellness Visit (AWV).  Last date of AWV: AWVI eligible as of  02/09/2023  Please schedule an AWVI appointment at any time with [place].  If any questions, please contact me at [phone].    Thank you,  [place] Direct dial  [phone]

## 2023-02-17 ENCOUNTER — Ambulatory Visit: Payer: Medicare Other | Admitting: Cardiology

## 2023-03-10 ENCOUNTER — Encounter: Payer: Self-pay | Admitting: Cardiology

## 2023-03-10 ENCOUNTER — Ambulatory Visit: Payer: Medicare Other | Attending: Cardiology | Admitting: Cardiology

## 2023-03-10 VITALS — BP 116/64 | HR 75 | Ht 69.0 in | Wt 286.2 lb

## 2023-03-10 DIAGNOSIS — E1169 Type 2 diabetes mellitus with other specified complication: Secondary | ICD-10-CM | POA: Insufficient documentation

## 2023-03-10 DIAGNOSIS — E1159 Type 2 diabetes mellitus with other circulatory complications: Secondary | ICD-10-CM | POA: Diagnosis present

## 2023-03-10 DIAGNOSIS — I471 Supraventricular tachycardia, unspecified: Secondary | ICD-10-CM | POA: Diagnosis present

## 2023-03-10 DIAGNOSIS — I10 Essential (primary) hypertension: Secondary | ICD-10-CM

## 2023-03-10 DIAGNOSIS — E669 Obesity, unspecified: Secondary | ICD-10-CM | POA: Diagnosis present

## 2023-03-10 DIAGNOSIS — I152 Hypertension secondary to endocrine disorders: Secondary | ICD-10-CM | POA: Diagnosis present

## 2023-03-10 DIAGNOSIS — R600 Localized edema: Secondary | ICD-10-CM | POA: Insufficient documentation

## 2023-03-10 NOTE — Progress Notes (Signed)
Cardiology Office Note:   Date:  03/10/2023  ID:  Amy Petty, DOB 03-18-1956, MRN [id number]  History of Present Illness:   Amy Petty is a 67 y.o. female with a past medical history of SVT status post ablation at [facility] in 2016, hypertension, hyperlipidemia, type 2 diabetes, and palpitations who is here today for follow-up.  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  Echocardiogram 08/11/2011 revealed normal LV size and wall thickness, LVEF 60 to 65% with G1 DD, no significant valvular abnormality.  Patient was last seen in clinic on 05/28/2022 by Dr.End.  At that time she had noted dependent swelling to her bilateral lower extremities which have been longstanding.  She also had some numbness to her toes.  Unfortunately she was also unable to tolerate her CPAP primarily with sleep on her stomach.  During her visit to discussion wearing a heart monitor or using an Apple Watch or cardia mobile to allow her to obtain a rhythm strip if she experiences palpitations as well as to decrease her caffeine intake.  Also discussed snoring and altered sleep and the agreement was made to defer sleep evaluation at that time and work on weight loss through diet and exercise.  She returns to clinic today stating that she has been doing well without any complaints today.  She did have present small procedure done on her nose.  She does continue to have some occasional swelling to her bilateral lower extremities primarily in the afternoons which has been longstanding.  Denies any changes in appetite or weight.  States she was recently told by her PCP that she was prediabetic so she is having more frequent blood work done.  Denies any chest pain, shortness of breath or palpitations.  Denies any hospitalizations or visits to the emergency department.  ROS: 10 point review of systems been reviewed is considered negative with exception of what is been listed in the HPI.  Studies Reviewed:    EKG: Sinus rhythm with a  rate of 75, no acute change from prior studies  TTE 08/11/2011 Left ventricle: The cavity size was normal. Wall thickness    was normal. Systolic function was normal. The estimated    ejection fraction was in the range of 60% to 65%. Wall    motion was normal; there were no regional wall motion    abnormalities. Doppler parameters are consistent with    abnormal left ventricular relaxation (grade 1 diastolic    dysfunction).   Risk Assessment/Calculations:              Physical Exam:   VS:  BP 116/64 (BP Location: Left Arm, Patient Position: Sitting, Cuff Size: Large)   Pulse 75   Ht 5\' 9"  (1.753 m)   Wt 286 lb 3.2 oz (129.8 kg)   LMP 11/23/2013   SpO2 94%   BMI 42.26 kg/m    Wt Readings from Last 3 Encounters:  03/10/23 286 lb 3.2 oz (129.8 kg)  12/30/22 287 lb 12.8 oz (130.5 kg)  10/07/22 279 lb (126.6 kg)     GEN: Well nourished, well developed in no acute distress NECK: No JVD; No carotid bruits CARDIAC: RRR, no murmurs, rubs, gallops RESPIRATORY:  Clear to auscultation without rales, wheezing or rhonchi  ABDOMEN: Soft, non-tender, non-distended EXTREMITIES:  No edema; No deformity   ASSESSMENT AND PLAN:   History of SVT with previous ablation procedure completed in 2016.  She denies any recurrent palpitations.  She is continued on Toprol-XL 100  mg daily.  Was previously concerned about the possibility of her going back into SVT even though had an ablation previously since that time she has not had any further episodes noted she is also continue to minimize her caffeine intake.  She is trying to increase her activity as discussed losing weight as well she was just diagnosed with prediabetes.  Essential hypertension with blood pressure 116/64.  She is continued on amlodipine 2.5 mg daily as well as Toprol-XL 100 mg daily and spironolactone 50 mg daily.  Blood pressures remain stable.  There were no changes made to her medications today.  Morbid obesity with a BMI greater  than 40.  She has been encouraged to increase her activity, weight loss, and dietary changes as she was just previously told she was prediabetic.  Peripheral edema that is often noticed more so in the afternoons or evenings.  Patient states it has been a longstanding issue.  It does resolve overnight.  She continues to have issues with edema or muscle cramps she can consider knee-high compression stockings for likely venous insufficiency.  Conservative therapy consist of elevating extremities, decrease in sodium intake, foot calf pumps, and compression stockings.  If need be consultation can be sent to vein and vascular if symptoms worsen over time.  Disposition patient return to clinic to see me/APP in 6 months or sooner if needed.        Signed, Copelyn Widmer, NP

## 2023-03-10 NOTE — Patient Instructions (Signed)
Medication Instructions:  No changes at this time.   *If you need a refill on your cardiac medications before your next appointment, please call your pharmacy*   Lab Work: None  If you have labs (blood work) drawn today and your tests are completely normal, you will receive your results only by: MyChart Message (if you have MyChart) OR A paper copy in the mail If you have any lab test that is abnormal or we need to change your treatment, we will call you to review the results.   Testing/Procedures: None   Follow-Up: At [facility], you and your health needs are our priority.  As part of our continuing mission to provide you with exceptional heart care, we have created designated Provider Care Teams.  These Care Teams include your primary Cardiologist (physician) and Advanced Practice Providers (APPs -  Physician Assistants and Nurse Practitioners) who all work together to provide you with the care you need, when you need it.   Your next appointment:   6-8 month(s)  Provider:   Yvonne Kendall, MD or Charlsie Quest, NP

## 2023-04-13 ENCOUNTER — Telehealth: Payer: Self-pay

## 2023-04-13 ENCOUNTER — Other Ambulatory Visit: Payer: Self-pay | Admitting: Internal Medicine

## 2023-04-13 ENCOUNTER — Ambulatory Visit (INDEPENDENT_AMBULATORY_CARE_PROVIDER_SITE_OTHER): Payer: Medicare Other | Admitting: Internal Medicine

## 2023-04-13 ENCOUNTER — Encounter: Payer: Self-pay | Admitting: Internal Medicine

## 2023-04-13 VITALS — BP 118/76 | HR 72 | Temp 97.5°F | Ht 69.0 in | Wt 280.2 lb

## 2023-04-13 DIAGNOSIS — E1169 Type 2 diabetes mellitus with other specified complication: Secondary | ICD-10-CM

## 2023-04-13 DIAGNOSIS — E1159 Type 2 diabetes mellitus with other circulatory complications: Secondary | ICD-10-CM

## 2023-04-13 DIAGNOSIS — I152 Hypertension secondary to endocrine disorders: Secondary | ICD-10-CM | POA: Diagnosis not present

## 2023-04-13 DIAGNOSIS — E669 Obesity, unspecified: Secondary | ICD-10-CM | POA: Diagnosis not present

## 2023-04-13 DIAGNOSIS — Z7985 Long-term (current) use of injectable non-insulin antidiabetic drugs: Secondary | ICD-10-CM

## 2023-04-13 DIAGNOSIS — R202 Paresthesia of skin: Secondary | ICD-10-CM | POA: Diagnosis not present

## 2023-04-13 DIAGNOSIS — R200 Anesthesia of skin: Secondary | ICD-10-CM

## 2023-04-13 DIAGNOSIS — G629 Polyneuropathy, unspecified: Secondary | ICD-10-CM

## 2023-04-13 DIAGNOSIS — I10 Essential (primary) hypertension: Secondary | ICD-10-CM

## 2023-04-13 DIAGNOSIS — E785 Hyperlipidemia, unspecified: Secondary | ICD-10-CM

## 2023-04-13 DIAGNOSIS — Z1231 Encounter for screening mammogram for malignant neoplasm of breast: Secondary | ICD-10-CM

## 2023-04-13 LAB — COMPREHENSIVE METABOLIC PANEL
ALT: 18 U/L (ref 0–35)
AST: 18 U/L (ref 0–37)
Albumin: 4.4 g/dL (ref 3.5–5.2)
Alkaline Phosphatase: 94 U/L (ref 39–117)
BUN: 18 mg/dL (ref 6–23)
CO2: 30 mEq/L (ref 19–32)
Calcium: 9.8 mg/dL (ref 8.4–10.5)
Chloride: 99 mEq/L (ref 96–112)
Creatinine, Ser: 0.73 mg/dL (ref 0.40–1.20)
GFR: 85.89 mL/min (ref >60.00)
Glucose, Bld: 122 mg/dL — ABNORMAL HIGH (ref 70–99)
Potassium: 4.3 mEq/L (ref 3.5–5.1)
Sodium: 139 mEq/L (ref 135–145)
Total Bilirubin: 0.5 mg/dL (ref 0.2–1.2)
Total Protein: 7.7 g/dL (ref 6.0–8.3)

## 2023-04-13 LAB — B12 AND FOLATE PANEL
Folate: >23.9 ng/mL (ref >5.9)
Vitamin B-12: 1088 pg/mL — ABNORMAL HIGH (ref 211–911)

## 2023-04-13 LAB — LIPID PANEL
Cholesterol: 144 mg/dL (ref 0–200)
HDL: 46.9 mg/dL (ref >39.00)
LDL Cholesterol: 76 mg/dL (ref 0–99)
NonHDL: 96.66
Total CHOL/HDL Ratio: 3
Triglycerides: 105 mg/dL (ref 0.0–149.0)
VLDL: 21 mg/dL (ref 0.0–40.0)

## 2023-04-13 LAB — HEMOGLOBIN A1C: Hgb A1c MFr Bld: 6.4 % (ref 4.6–6.5)

## 2023-04-13 LAB — MICROALBUMIN / CREATININE URINE RATIO
Creatinine,U: 266.2 mg/dL
Microalb Creat Ratio: 0.6 mg/g (ref 0.0–30.0)
Microalb, Ur: 1.6 mg/dL (ref 0.0–1.9)

## 2023-04-13 MED ORDER — ATORVASTATIN CALCIUM 10 MG PO TABS: 10.0000 mg | ORAL_TABLET | Freq: Every day | ORAL | 3 refills | Status: DC

## 2023-04-13 MED ORDER — TIRZEPATIDE 2.5 MG/0.5ML ~~LOC~~ SOAJ: 2.5000 mg | SUBCUTANEOUS | 2 refills | Status: DC

## 2023-04-13 NOTE — Telephone Encounter (Signed)
Medication Samples have been provided to the patient.  Drug name: Ozempic       Strength: 0.25 mg        Qty: 1 box  LOT: [id number]  Exp.Date: 05/09/2024  Dosing instructions: Inject 0.25 mg into skin once weekly.   The patient has been instructed regarding the correct time, dose, and frequency of taking this medication, including desired effects and most common side effects.   Evangelos Paulino 8:51 AM 04/13/2023

## 2023-04-13 NOTE — Assessment & Plan Note (Signed)
10 year risk  Is elevated due to concurrent diabetes diagnosis and statin therapy is advised.  She is tolerating  atorvastatin.    Lab Results  Component Value Date   CHOL 144 04/13/2023   HDL 46.90 04/13/2023   LDLCALC 76 04/13/2023   TRIG 105.0 04/13/2023   CHOLHDL 3 04/13/2023

## 2023-04-13 NOTE — Patient Instructions (Addendum)
Congratulations on retiring!   Please ask Dr Alvester Morin to send me your diabetic eye exam report  ozempic  and Greggory Keen are medications taken as a weekly subcutaneous injection. They are not insulin. They cause  your pancreas to increase its  own insulin secretion  And also slows down the emptying of your stomach,  So it decreases your appetite and helps you lose weight.    The sample you have will last 4 weeks,  time to get Prior authorization for [facility].    Regards,   Duncan Dull, MD

## 2023-04-13 NOTE — Telephone Encounter (Signed)
Apologies, upon further review, I do see that pt does have A1C requirements for being considered diabetic, I don't see it listed under endocrinology in her problem list as it would usually appear for other patients. PA has been submitted and will be documented in separate encounter

## 2023-04-13 NOTE — Assessment & Plan Note (Addendum)
Patient has been unable to lose or maintain a healthy weight despite good effort. Encouraged to increase exercise involvement to include  walking  for 30 minutes 5 days per week.  Screened for contraindications to use of  GLP 1 agonists for appetite suppression and she has none.  The risks and benefits of pharmacotherapy discussed and she is requesting a trial of therapy .  rx written for [facility],  but PA is needed  so she was given a 4 week sample  of ozempic 0.25 mg weekly;  first dose given today

## 2023-04-13 NOTE — Telephone Encounter (Signed)
noted 

## 2023-04-13 NOTE — Telephone Encounter (Signed)
Patient Advocate Encounter   Received notification from [facility] that prior authorization for [facility] Medicare is required.   PA submitted on 04/13/2023 [facility] Insurance [facility] Medicaid Status is pending

## 2023-04-13 NOTE — Telephone Encounter (Signed)
Pt is not diabetic and Mounjaro is only approved for use in patient's with type 2 diabetes.

## 2023-04-13 NOTE — Assessment & Plan Note (Signed)
Chronic,  predates her development of type 2 DM Numbness of both great toes continues.  B12 and folate ordered.  Has lumbar radiculitis.  Lab Results  Component Value Date   HGBA1C 6.5 12/30/2022   No results found for: "RPR" Lab Results  Component Value Date   TSH 2.75 04/14/2022

## 2023-04-13 NOTE — Telephone Encounter (Signed)
Amy Petty has been approved. Does pt need to finish the ozempic before switching to the [facility]?

## 2023-04-13 NOTE — Telephone Encounter (Signed)
Spoke with pt and informed her that the Amy Petty has been approved through her insurance. Pt gave a verbal understanding.

## 2023-04-13 NOTE — Progress Notes (Signed)
Subjective:  Patient ID: Amy Petty, female    DOB: [birth year]  Age: 67 y.o. MRN: [id number]  CC: The primary encounter diagnosis was Encounter for screening mammogram for malignant neoplasm of breast. Diagnoses of Primary hypertension, Hyperlipidemia LDL goal <100, Obesity, diabetes, and hypertension syndrome (HCC), Numbness and tingling of foot, and Peripheral polyneuropathy were also pertinent to this visit.   HPI Amy Petty presents for  Chief Complaint  Patient presents with   Medical Management of Chronic Issues   1) Obesity / Diabetes/Hypertension :  She  feels generally well, but has not been exercising due to chronic low back pain .  However she is motivated to start because she retired from full time work one week ago.  She is  trying to lose weight. Patient has been unable to lose or maintain a healthy weight .  She was   Screened for contraindications to use of  GLP 1 agonists for appetite suppression and she has none.  The risks and benefits of pharmacotherapy discussed and she is requesting a trial of therapy .  rx written.  Checking  blood sugars less than once daily at variable times, usually only if she feels she may be having a hypoglycemic event. .  BS have been < 160 post prandially.  Denies any recent hypoglyemic events.  Taking   medications as directed. Following a carbohydrate modified diet 6 days per week. Has chronic peripheral neuropathy with numbness and burning sensation of both great toes  but has lumbar radiculitis. The pain is positonal and aggravated by prolonged standing. Appetite is good.  Diabetic eye exam was done in Jan 2024 with a retinal specialist in [place] ; her regular eye exams are with Dr Alvester Morin  .     Outpatient Medications Prior to Visit  Medication Sig Dispense Refill   acetaminophen (TYLENOL) 650 MG CR tablet Take 650 mg by mouth 2 (two) times daily.     albuterol (PROAIR HFA) 108 (90 Base) MCG/ACT inhaler Inhale 2 puffs into the lungs every 6 (six)  hours as needed for wheezing or shortness of breath. 1 each 0   amLODipine (NORVASC) 2.5 MG tablet TAKE ONE TABLET BY MOUTH ONE TIME DAILY 90 tablet 1   cholecalciferol (VITAMIN D) 1000 units tablet Take 1,000 Units by mouth daily.     ketoconazole (NIZORAL) 2 % shampoo WASH SCALP 3-4 TIMES PER WEEK. LET SIT SEVERAL MINUTES BEFORE RINSING  3   Melatonin 2.5 MG CHEW Chew 1 tablet by mouth at bedtime.     metoprolol succinate (TOPROL-XL) 100 MG 24 hr tablet TAKE ONE TABLET BY MOUTH ONE TIME DAILY WITH OR IMMEDIATELY FOLLOWING A MEAL 90 tablet 1   montelukast (SINGULAIR) 10 MG tablet TAKE ONE TABLET BY MOUTH ONE TIME DAILY AT BEDTIME 90 tablet 1   Multiple Vitamin (MULTIVITAMIN) tablet Take 1 tablet by mouth daily.       mupirocin ointment (BACTROBAN) 2 % Apply 1 Application topically 2 (two) times daily. To eyebrows 22 g 0   naproxen sodium (ALEVE) 220 MG tablet Take 220 mg by mouth 2 (two) times daily.     omeprazole (PRILOSEC) 40 MG capsule Take 1 capsule (40 mg total) by mouth daily. 90 capsule 2   promethazine-dextromethorphan (PROMETHAZINE-DM) 6.25-15 MG/5ML syrup Take 5 mLs by mouth 4 (four) times daily as needed for cough. 118 mL 0   spironolactone (ALDACTONE) 50 MG tablet Take 1 tablet (50 mg total) by mouth daily. 90 tablet 1  triamcinolone (NASACORT ALLERGY 24HR) 55 MCG/ACT AERO nasal inhaler Place 2 sprays into the nose at bedtime.     atorvastatin (LIPITOR) 10 MG tablet Take 1 tablet (10 mg total) by mouth daily. 90 tablet 3   No facility-administered medications prior to visit.    Review of Systems;  Patient denies headache, fevers, malaise, unintentional weight loss, skin rash, eye pain, sinus congestion and sinus pain, sore throat, dysphagia,  hemoptysis , cough, dyspnea, wheezing, chest pain, palpitations, orthopnea, edema, abdominal pain, nausea, melena, diarrhea, constipation, flank pain, dysuria, hematuria, urinary  Frequency, nocturia, numbness, tingling, seizures,  Focal  weakness, Loss of consciousness,  Tremor, insomnia, depression, anxiety, and suicidal ideation.      Objective:  BP 118/76   Pulse 72   Temp (!) 97.5 F (36.4 C) (Oral)   Ht 5\' 9"  (1.753 m)   Wt 280 lb 3.2 oz (127.1 kg)   LMP 11/23/2013   SpO2 94%   BMI 41.38 kg/m   BP Readings from Last 3 Encounters:  04/13/23 118/76  03/10/23 116/64  12/30/22 118/76    Wt Readings from Last 3 Encounters:  04/13/23 280 lb 3.2 oz (127.1 kg)  03/10/23 286 lb 3.2 oz (129.8 kg)  12/30/22 287 lb 12.8 oz (130.5 kg)    Physical Exam Vitals reviewed.  Constitutional:      General: She is not in acute distress.    Appearance: Normal appearance. She is normal weight. She is not ill-appearing, toxic-appearing or diaphoretic.  HENT:     Head: Normocephalic.  Eyes:     General: No scleral icterus.       Right eye: No discharge.        Left eye: No discharge.     Conjunctiva/sclera: Conjunctivae normal.  Cardiovascular:     Rate and Rhythm: Normal rate and regular rhythm.     Heart sounds: Normal heart sounds.  Pulmonary:     Effort: Pulmonary effort is normal. No respiratory distress.     Breath sounds: Normal breath sounds.  Musculoskeletal:        General: Normal range of motion.  Skin:    General: Skin is warm and dry.  Neurological:     General: No focal deficit present.     Mental Status: She is alert and oriented to person, place, and time. Mental status is at baseline.  Psychiatric:        Mood and Affect: Mood normal.        Behavior: Behavior normal.        Thought Content: Thought content normal.        Judgment: Judgment normal.    Lab Results  Component Value Date   HGBA1C 6.4 04/13/2023   HGBA1C 6.5 12/30/2022   HGBA1C 6.6 (H) 08/21/2022    Lab Results  Component Value Date   CREATININE 0.73 04/13/2023   CREATININE 0.64 12/30/2022   CREATININE 0.73 08/21/2022    Lab Results  Component Value Date   WBC 8.5 04/14/2022   HGB 13.8 04/14/2022   HCT 42.2  04/14/2022   PLT 282.0 04/14/2022   GLUCOSE 122 (H) 04/13/2023   CHOL 144 04/13/2023   TRIG 105.0 04/13/2023   HDL 46.90 04/13/2023   LDLCALC 76 04/13/2023   ALT 18 04/13/2023   AST 18 04/13/2023   NA 139 04/13/2023   K 4.3 04/13/2023   CL 99 04/13/2023   CREATININE 0.73 04/13/2023   BUN 18 04/13/2023   CO2 30 04/13/2023   TSH 2.75 04/14/2022  HGBA1C 6.4 04/13/2023   MICROALBUR 1.6 04/13/2023    DG FACET JT INJ L /S SINGLE LEVEL RIGHT W/FL/CT  Result Date: 06/17/2022 CLINICAL DATA:  67 year old female with chronic right-sided low back pain. She presents for right L4-L5 facet injection. EXAM: LEFT L4-5 FACET INJECTION UNDER FLUOROSCOPY TECHNIQUE: Overlying skin prepped with Betadine, draped in the usual sterile fashion, and infiltrated locally with buffered Lidocaine. Curved 22 gauge spinal needle advanced to the posterior aspect of the right L4-5 facet. 80 mg Depo-Medrol and 2 ml 1% lidocaine was then administered. No immediate complication. FLUOROSCOPY: Radiation Exposure Index (as provided by the fluoroscopic device): 4.8 mGy Kerma IMPRESSION: Technically successful right L4-5 facet injection under fluoroscopy. Electronically Signed   By: Malachy Moan M.D.   On: 06/17/2022 14:00    Assessment & Plan:  .Encounter for screening mammogram for malignant neoplasm of breast -     3D Screening Mammogram, Left and Right; Future  Primary hypertension  Hyperlipidemia LDL goal <100 Assessment & Plan: 10 year risk  Is elevated due to concurrent diabetes diagnosis and statin therapy is advised.  She is tolerating  atorvastatin.    Lab Results  Component Value Date   CHOL 144 04/13/2023   HDL 46.90 04/13/2023   LDLCALC 76 04/13/2023   TRIG 105.0 04/13/2023   CHOLHDL 3 04/13/2023      Obesity, diabetes, and hypertension syndrome (HCC) Assessment & Plan: Patient has been unable to lose or maintain a healthy weight despite good effort. Encouraged to increase exercise involvement  to include  walking  for 30 minutes 5 days per week.  Screened for contraindications to use of  GLP 1 agonists for appetite suppression and she has none.  The risks and benefits of pharmacotherapy discussed and she is requesting a trial of therapy .  rx written for [facility],  but PA is needed  so she was given a 4 week sample  of ozempic 0.25 mg weekly;  first dose given today   Orders: -     Microalbumin / creatinine urine ratio -     Hemoglobin A1c -     Comprehensive metabolic panel -     Lipid panel  Numbness and tingling of foot -     B12 and Folate Panel  Peripheral polyneuropathy Assessment & Plan:  Chronic,  predates her development of type 2 DM Numbness of both great toes continues.  B12 and folate ordered.  Has lumbar radiculitis.  Lab Results  Component Value Date   HGBA1C 6.5 12/30/2022   No results found for: "RPR" Lab Results  Component Value Date   TSH 2.75 04/14/2022      Other orders -     Atorvastatin Calcium; Take 1 tablet (10 mg total) by mouth daily.  Dispense: 90 tablet; Refill: 3 -     Tirzepatide; Inject 2.5 mg into the skin once a week.  Dispense: 2 mL; Refill: 2     I provided 35 minutes of face-to-face time during this encounter reviewing patient's last visit with me, patient's  most recent visit with orthopedics,  recent surgical and non surgical procedures, previous  labs and imaging studies, counseling on currently addressed issues,  and post visit ordering to diagnostics and therapeutics .   Follow-up: Return in about 3 months (around 07/13/2023).   Sherlene Shams, MD

## 2023-04-13 NOTE — Telephone Encounter (Signed)
PA for Mounjaro is needed. 

## 2023-04-13 NOTE — Telephone Encounter (Signed)
Pharmacy Patient Advocate Encounter  Prior Authorization for Greggory Keen has been APPROVED by [place] MEDICAID from 04/13/2023 to 11/09/2098.   PA # [id number]

## 2023-04-14 ENCOUNTER — Ambulatory Visit (INDEPENDENT_AMBULATORY_CARE_PROVIDER_SITE_OTHER): Payer: Medicare Other | Admitting: Dermatology

## 2023-04-14 VITALS — BP 112/66 | HR 77

## 2023-04-14 DIAGNOSIS — W908XXA Exposure to other nonionizing radiation, initial encounter: Secondary | ICD-10-CM

## 2023-04-14 DIAGNOSIS — Z1283 Encounter for screening for malignant neoplasm of skin: Secondary | ICD-10-CM | POA: Diagnosis not present

## 2023-04-14 DIAGNOSIS — Z85828 Personal history of other malignant neoplasm of skin: Secondary | ICD-10-CM

## 2023-04-14 DIAGNOSIS — D229 Melanocytic nevi, unspecified: Secondary | ICD-10-CM

## 2023-04-14 DIAGNOSIS — X32XXXA Exposure to sunlight, initial encounter: Secondary | ICD-10-CM

## 2023-04-14 DIAGNOSIS — L578 Other skin changes due to chronic exposure to nonionizing radiation: Secondary | ICD-10-CM

## 2023-04-14 DIAGNOSIS — L570 Actinic keratosis: Secondary | ICD-10-CM

## 2023-04-14 DIAGNOSIS — C4359 Malignant melanoma of other part of trunk: Secondary | ICD-10-CM | POA: Diagnosis not present

## 2023-04-14 DIAGNOSIS — D1801 Hemangioma of skin and subcutaneous tissue: Secondary | ICD-10-CM | POA: Diagnosis not present

## 2023-04-14 DIAGNOSIS — C439 Malignant melanoma of skin, unspecified: Secondary | ICD-10-CM

## 2023-04-14 DIAGNOSIS — L814 Other melanin hyperpigmentation: Secondary | ICD-10-CM

## 2023-04-14 DIAGNOSIS — L821 Other seborrheic keratosis: Secondary | ICD-10-CM

## 2023-04-14 DIAGNOSIS — I8393 Asymptomatic varicose veins of bilateral lower extremities: Secondary | ICD-10-CM

## 2023-04-14 DIAGNOSIS — D485 Neoplasm of uncertain behavior of skin: Secondary | ICD-10-CM

## 2023-04-14 HISTORY — DX: Malignant melanoma of skin, unspecified: C43.9

## 2023-04-14 NOTE — Progress Notes (Signed)
Follow-Up Visit   Subjective  Amy Petty is a 67 y.o. female who presents for the following: Skin Cancer Screening and Full Body Skin Exam.   The patient presents for Total-Body Skin Exam (TBSE) for skin cancer screening and mole check. The patient has spots, moles and lesions to be evaluated, some may be new or changing.  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    The following portions of the chart were reviewed this encounter and updated as appropriate: medications, allergies, medical history  Review of Systems:  No other skin or systemic complaints except as noted in HPI or Assessment and Plan.  Objective  Well appearing patient in no apparent distress; mood and affect are within normal limits.  A full examination was performed including scalp, head, eyes, ears, nose, lips, neck, chest, axillae, abdomen, back, buttocks, bilateral upper extremities, bilateral lower extremities, hands, feet, fingers, toes, fingernails, and toenails. All findings within normal limits unless otherwise noted below.   Relevant physical exam findings are noted in the Assessment and Plan.  Central Upper Abdomen 0.6 cm crusted pink red papule central upper abdomen   Right Spinal Mid Upper Back 10 x 6 mm brown papule with darker edge       post crown x 4, mid crown x 3, frontal hairline x 1 (residual) (8) Pink scaly macules.    Assessment & Plan   LENTIGINES, SEBORRHEIC KERATOSES, HEMANGIOMAS - Benign normal skin lesions - Benign-appearing - Call for any changes  MELANOCYTIC NEVI - Tan-brown and/or pink-flesh-colored symmetric macules and papules - Benign appearing on exam today - Observation - Call clinic for new or changing moles - Recommend daily use of broad spectrum spf 30+ sunscreen to sun-exposed areas.   ACTINIC DAMAGE - Chronic condition, secondary to cumulative UV/sun exposure - diffuse scaly erythematous macules with underlying  dyspigmentation - Recommend daily broad spectrum sunscreen SPF 30+ to sun-exposed areas, reapply every 2 hours as needed.  - Staying in the shade or wearing long sleeves, sun glasses (UVA+UVB protection) and wide brim hats (4-inch brim around the entire circumference of the hat) are also recommended for sun protection.  - Call for new or changing lesions.  SKIN CANCER SCREENING PERFORMED TODAY.  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  - Call if any new or changing lesions are noted between office visits  Varicose Veins/Spider Veins - Dilated blue, purple or red veins at the lower extremities - Reassured - Smaller vessels can be treated by sclerotherapy (a procedure to inject a medicine into the veins to make them disappear) if desired, but the treatment is not covered by insurance. Larger vessels may be covered if symptomatic and we would refer to vascular surgeon if treatment desired.   Neoplasm of uncertain behavior of skin (2) Central Upper Abdomen  Epidermal / dermal shaving  Lesion diameter (cm):  0.6 Informed consent: discussed and consent obtained   Patient was prepped and draped in usual sterile fashion: Area prepped with alcohol. Anesthesia: the lesion was anesthetized in a standard fashion   Anesthetic:  1% lidocaine w/ epinephrine 1-100,000 buffered w/ 8.4% NaHCO3 Instrument used: flexible razor blade   Hemostasis achieved with: pressure, aluminum  chloride and electrodesiccation   Outcome: patient tolerated procedure well   Post-procedure details: wound care instructions given   Post-procedure details comment:  Ointment and small bandage applied  Specimen 1 - Surgical pathology Differential Diagnosis: Irritated Hemangioma vs Inflamed  SK Check Margins: No  Right Spinal Mid Upper Back  Epidermal / dermal shaving  Lesion diameter (cm):  1.1 Informed consent: discussed and consent obtained   Patient was prepped and draped in usual sterile fashion: Area prepped with alcohol. Anesthesia: the lesion was anesthetized in a standard fashion   Anesthetic:  1% lidocaine w/ epinephrine 1-100,000 buffered w/ 8.4% NaHCO3 Instrument used: flexible razor blade   Hemostasis achieved with: pressure, aluminum chloride and electrodesiccation   Outcome: patient tolerated procedure well   Post-procedure details: wound care instructions given   Post-procedure details comment:  Ointment and small bandage applied  Specimen 2 - Surgical pathology Differential Diagnosis: SK vs Lentigo r/o Atypia Check Margins: Yes  AK (actinic keratosis) (8) post crown x 4, mid crown x 3, frontal hairline x 1 (residual)  Actinic keratoses are precancerous spots that appear secondary to cumulative UV radiation exposure/sun exposure over time. They are chronic with expected duration over 1 year. A portion of actinic keratoses will progress to squamous cell carcinoma of the skin. It is not possible to reliably predict which spots will progress to skin cancer and so treatment is recommended to prevent development of skin cancer.  Recommend daily broad spectrum sunscreen SPF 30+ to sun-exposed areas, reapply every 2 hours as needed.  Recommend staying in the shade or wearing long sleeves, sun glasses (UVA+UVB protection) and wide brim hats (4-inch brim around the entire circumference of the hat). Call for new or changing lesions.  Destruction of lesion - post crown x 4, mid crown x 3, frontal hairline x 1 (residual)  Destruction method: cryotherapy   Informed consent: discussed and consent obtained   Lesion destroyed using liquid nitrogen: Yes   Region frozen until ice ball extended beyond lesion: Yes   Outcome: patient tolerated procedure well with no  complications   Post-procedure details: wound care instructions given   Additional details:  Prior to procedure, discussed risks of blister formation, small wound, skin dyspigmentation, or rare scar following cryotherapy. Recommend Vaseline ointment to treated areas while healing.    Return in about 6 months (around 10/14/2023) for AKs.  [name], CMA, am acting as scribe for [name], MD .   Documentation: I have reviewed the above documentation for accuracy and completeness, and I agree with the above.  [name], MD

## 2023-04-14 NOTE — Patient Instructions (Addendum)
Wound Care Instructions  Cleanse wound gently with soap and water once a day then pat dry with clean gauze. Apply a thin coat of Petrolatum (petroleum jelly, "Vaseline") over the wound (unless you have an allergy to this). We recommend that you use a new, sterile tube of Vaseline. Do not pick or remove scabs. Do not remove the yellow or white "healing tissue" from the base of the wound.  Cover the wound with fresh, clean, nonstick gauze and secure with paper tape. You may use Band-Aids in place of gauze and tape if the wound is small enough, but would recommend trimming much of the tape off as there is often too much. Sometimes Band-Aids can irritate the skin.  You should call the office for your biopsy report after 1 week if you have not already been contacted.  If you experience any problems, such as abnormal amounts of bleeding, swelling, significant bruising, significant pain, or evidence of infection, please call the office immediately.  FOR ADULT SURGERY PATIENTS: If you need something for pain relief you may take 1 extra strength Tylenol (acetaminophen) AND 2 Ibuprofen (200mg each) together every 4 hours as needed for pain. (do not take these if you are allergic to them or if you have a reason you should not take them.) Typically, you may only need pain medication for 1 to 3 days.     Due to recent changes in healthcare laws, you may see results of your pathology and/or laboratory studies on MyChart before the doctors have had a chance to review them. We understand that in some cases there may be results that are confusing or concerning to you. Please understand that not all results are received at the same time and often the doctors may need to interpret multiple results in order to provide you with the best plan of care or course of treatment. Therefore, we ask that you please give us 2 business days to thoroughly review all your results before contacting the office for clarification. Should  we see a critical lab result, you will be contacted sooner.   If You Need Anything After Your Visit  If you have any questions or concerns for your doctor, please call our main line at [phone] and press option 4 to reach your doctor's medical assistant. If no one answers, please leave a voicemail as directed and we will return your call as soon as possible. Messages left after 4 pm will be answered the following business day.   You may also send us a message via MyChart. We typically respond to MyChart messages within 1-2 business days.  For prescription refills, please ask your pharmacy to contact our office. Our fax number is [phone].  If you have an urgent issue when the clinic is closed that cannot wait until the next business day, you can page your doctor at the number below.    Please note that while we do our best to be available for urgent issues outside of office hours, we are not available 24/7.   If you have an urgent issue and are unable to reach us, you may choose to seek medical care at your doctor's office, retail clinic, urgent care center, or emergency room.  If you have a medical emergency, please immediately call 911 or go to the emergency department.  Pager Numbers  - Dr. Kowalski: [phone]  - Dr. Moye: [phone]  - Dr. Stewart: [phone]  In the event of inclement weather, please call our main line at   [phone] for an update on the status of any delays or closures.  Dermatology Medication Tips: Please keep the boxes that topical medications come in in order to help keep track of the instructions about where and how to use these. Pharmacies typically print the medication instructions only on the boxes and not directly on the medication tubes.   If your medication is too expensive, please contact our office at [phone] option 4 or send us a message through MyChart.   We are unable to tell what your co-pay for medications will be in  advance as this is different depending on your insurance coverage. However, we may be able to find a substitute medication at lower cost or fill out paperwork to get insurance to cover a needed medication.   If a prior authorization is required to get your medication covered by your insurance company, please allow us 1-2 business days to complete this process.  Drug prices often vary depending on where the prescription is filled and some pharmacies may offer cheaper prices.  The website www.goodrx.com contains coupons for medications through different pharmacies. The prices here do not account for what the cost may be with help from insurance (it may be cheaper with your insurance), but the website can give you the price if you did not use any insurance.  - You can print the associated coupon and take it with your prescription to the pharmacy.  - You may also stop by our office during regular business hours and pick up a GoodRx coupon card.  - If you need your prescription sent electronically to a different pharmacy, notify our office through [place] MyChart or by phone at [phone] option 4.     Si Usted Necesita Algo Despus de Su Visita  Tambin puede enviarnos un mensaje a travs de MyChart. Por lo general respondemos a los mensajes de MyChart en el transcurso de 1 a 2 das hbiles.  Para renovar recetas, por favor pida a su farmacia que se ponga en contacto con nuestra oficina. Nuestro nmero de fax es el [phone].  Si tiene un asunto urgente cuando la clnica est cerrada y que no puede esperar hasta el siguiente da hbil, puede llamar/localizar a su doctor(a) al nmero que aparece a continuacin.   Por favor, tenga en cuenta que aunque hacemos todo lo posible para estar disponibles para asuntos urgentes fuera del horario de oficina, no estamos disponibles las 24 horas del da, los 7 das de la semana.   Si tiene un problema urgente y no puede comunicarse con nosotros, puede  optar por buscar atencin mdica  en el consultorio de su doctor(a), en una clnica privada, en un centro de atencin urgente o en una sala de emergencias.  Si tiene una emergencia mdica, por favor llame inmediatamente al 911 o vaya a la sala de emergencias.  Nmeros de bper  - Dr. Kowalski: [phone]  - Dra. Moye: [phone]  - Dra. Stewart: [phone]  En caso de inclemencias del tiempo, por favor llame a nuestra lnea principal al [phone] para una actualizacin sobre el estado de cualquier retraso o cierre.  Consejos para la medicacin en dermatologa: Por favor, guarde las cajas en las que vienen los medicamentos de uso tpico para ayudarle a seguir las instrucciones sobre dnde y cmo usarlos. Las farmacias generalmente imprimen las instrucciones del medicamento slo en las cajas y no directamente en los tubos del medicamento.   Si su medicamento es muy caro, por favor, pngase en contacto con   nuestra oficina llamando al [phone] y presione la opcin 4 o envenos un mensaje a travs de MyChart.   No podemos decirle cul ser su copago por los medicamentos por adelantado ya que esto es diferente dependiendo de la cobertura de su seguro. Sin embargo, es posible que podamos encontrar un medicamento sustituto a menor costo o llenar un formulario para que el seguro cubra el medicamento que se considera necesario.   Si se requiere una autorizacin previa para que su compaa de seguros cubra su medicamento, por favor permtanos de 1 a 2 das hbiles para completar este proceso.  Los precios de los medicamentos varan con frecuencia dependiendo del lugar de dnde se surte la receta y alguna farmacias pueden ofrecer precios ms baratos.  El sitio web www.goodrx.com tiene cupones para medicamentos de diferentes farmacias. Los precios aqu no tienen en cuenta lo que podra costar con la ayuda del seguro (puede ser ms barato con su seguro), pero el sitio web puede darle el  precio si no utiliz ningn seguro.  - Puede imprimir el cupn correspondiente y llevarlo con su receta a la farmacia.  - Tambin puede pasar por nuestra oficina durante el horario de atencin regular y recoger una tarjeta de cupones de GoodRx.  - Si necesita que su receta se enve electrnicamente a una farmacia diferente, informe a nuestra oficina a travs de MyChart de [place] o por telfono llamando al [phone] y presione la opcin 4.  

## 2023-04-17 NOTE — Telephone Encounter (Signed)
Pt called in to let Shanda Bumps knows that [facility] its 533$ for her. Not 8$ as mentioned, and she said she cannot afford that.

## 2023-04-20 MED ORDER — SEMAGLUTIDE (1 MG/DOSE) 4 MG/3ML ~~LOC~~ SOPN: 1.0000 mg | PEN_INJECTOR | SUBCUTANEOUS | 2 refills | Status: DC

## 2023-04-20 NOTE — Telephone Encounter (Signed)
IF THE OZEMPIC IS BETTER COVERED,  SHE CAN MANIPULATE THE 1 MG DOSE PEN TO DELIVER A SMALLER AMOUNT AS FOLLOWS   OZEMPIC PENS CAN BE MANIPULATED INTO DELIVERING LOWER DOSES BY "COUNTING CLICKS"    THE 4 MG PEN  THAT DELIVERS THE 1 MG WEEKLY DOSE:  19 CLICKS    0.25 MG DOSE 37 CLICKS    0.50 MG DOSE

## 2023-04-20 NOTE — Telephone Encounter (Signed)
PA for Ozempic is needed. 

## 2023-04-20 NOTE — Addendum Note (Signed)
Addended by: Sherlene Shams on: 04/20/2023 05:19 PM   Modules accepted: Orders

## 2023-04-21 ENCOUNTER — Other Ambulatory Visit (HOSPITAL_COMMUNITY): Payer: Self-pay

## 2023-04-21 NOTE — Telephone Encounter (Signed)
Patient Advocate Encounter   Received notification from [facility] that prior authorization for Ozempic is required.   PA submitted on 04/21/2023 Key [id number] Insurance [employer] Status is pending

## 2023-04-22 ENCOUNTER — Other Ambulatory Visit (HOSPITAL_COMMUNITY): Payer: Self-pay

## 2023-04-22 NOTE — Telephone Encounter (Signed)
Pharmacy Patient Advocate Encounter  Received notification from [facility] that Prior Authorization for Ozempic has been APPROVED from 04/07/2023 to Further notice..  PA #/Case ID/Reference #: [id number]

## 2023-04-22 NOTE — Telephone Encounter (Signed)
noted 

## 2023-04-23 ENCOUNTER — Ambulatory Visit
Admission: RE | Admit: 2023-04-23 | Discharge: 2023-04-23 | Disposition: A | Discharge status: Home / Self Care | Payer: Medicare Other | Source: Ambulatory Visit | Attending: Internal Medicine | Admitting: Internal Medicine

## 2023-04-23 DIAGNOSIS — Z1231 Encounter for screening mammogram for malignant neoplasm of breast: Secondary | ICD-10-CM | POA: Diagnosis present

## 2023-04-27 ENCOUNTER — Encounter: Payer: Self-pay | Admitting: Dermatology

## 2023-04-27 ENCOUNTER — Ambulatory Visit (INDEPENDENT_AMBULATORY_CARE_PROVIDER_SITE_OTHER): Payer: Medicare Other | Admitting: Dermatology

## 2023-04-27 VITALS — BP 115/77 | HR 76

## 2023-04-27 DIAGNOSIS — C4359 Malignant melanoma of other part of trunk: Secondary | ICD-10-CM | POA: Diagnosis not present

## 2023-04-27 DIAGNOSIS — Z85828 Personal history of other malignant neoplasm of skin: Secondary | ICD-10-CM | POA: Diagnosis not present

## 2023-04-27 DIAGNOSIS — L719 Rosacea, unspecified: Secondary | ICD-10-CM | POA: Diagnosis not present

## 2023-04-27 MED ORDER — DOXYCYCLINE MONOHYDRATE 100 MG PO CAPS: 100.0000 mg | ORAL_CAPSULE | Freq: Two times a day (BID) | ORAL | 0 refills | Status: AC

## 2023-04-27 MED ORDER — MUPIROCIN 2 % EX OINT: 1.0000 | TOPICAL_OINTMENT | Freq: Every day | CUTANEOUS | 0 refills | Status: DC

## 2023-04-27 MED ORDER — METRONIDAZOLE 0.75 % EX CREA: TOPICAL_CREAM | CUTANEOUS | 5 refills | Status: DC

## 2023-04-27 NOTE — Patient Instructions (Signed)
Wound Care Instructions for After Surgery  On the day following your surgery, you should begin doing daily dressing changes until your sutures are removed: Remove the bandage. Cleanse the wound gently with soap and water.  Make sure you then dry the skin surrounding the wound completely or the tape will not stick to the skin. Do not use cotton balls on the wound. After the wound is clean and dry, apply the ointment (either prescription antibiotic prescribed by your doctor or plain Vaseline if nothing was prescribed) gently with a Q-tip. If you are using a bandaid to cover: Apply a bandaid large enough to cover the entire wound. If you do not have a bandaid large enough to cover the wound OR if you are sensitive to bandaid adhesive: Cut a non-stick pad (such as Telfa) to fit the size of the wound.  Cover the wound with the non-stick pad. If the wound is draining, you may want to add a small amount of gauze on top of the non-stick pad for a little added compression to the area. Use tape to seal the area completely.  For the next 1-2 weeks: Be sure to keep the wound moist with ointment 24/7 to ensure best healing. If you are unable to cover the wound with a bandage to hold the ointment in place, you may need to reapply the ointment several times a day. Do not bend over or lift heavy items to reduce the chance of elevated blood pressure to the wound. Do not participate in particularly strenuous activities.  Below is a list of dressing supplies you might need.  Cotton-tipped applicators - Q-tips Gauze pads (2x2 and/or 4x4) - All-Purpose Sponges New and clean tube of petroleum jelly (Vaseline) OR prescription antibiotic ointment if prescribed Either a bandaid large enough to cover the entire wound OR non-stick dressing material (Telfa) and Tape (Paper or Hypafix)  FOR ADULT SURGERY PATIENTS: If you need something for pain relief, you may take 1 extra strength Tylenol (acetaminophen) and 2  ibuprofen (200 mg) together every 4 hours as needed. (Do not take these medications if you are allergic to them or if you know you cannot take them for any other reason). Typically you may only need pain medication for 1-3 days.   Comments on the Post-Operative Period Slight swelling and redness often appear around the wound. This is normal and will disappear within several days following the surgery. The healing wound will drain a brownish-red-yellow discharge during healing. This is a normal phase of wound healing. As the wound begins to heal, the drainage may increase in amount. Again, this drainage is normal. Notify us if the drainage becomes persistently bloody, excessively swollen, or intensely painful or develops a foul odor or red streaks.  The healing wound will also typically be itchy. This is normal. If you have severe or persistent pain, Notify us if the discomfort is severe or persistent. Avoid alcoholic beverages when taking pain medicine.  In Case of Wound Hemorrhage A wound hemorrhage is when the bandage suddenly becomes soaked with bright red blood and flows profusely. If this happens, sit down or lie down with your head elevated. If the wound has a dressing on it, do not remove the dressing. Apply pressure to the existing gauze. If the wound is not covered, use a gauze pad to apply pressure and continue applying the pressure for 20 minutes without peeking. DO NOT COVER THE WOUND WITH A LARGE TOWEL OR WASH CLOTH. Release your hand from the   wound site but do not remove the dressing. If the bleeding has stopped, gently clean around the wound. Leave the dressing in place for 24 hours if possible. This wait time allows the blood vessels to close off so that you do not spark a new round of bleeding by disrupting the newly clotted blood vessels with an immediate dressing change. If the bleeding does not subside, continue to hold pressure for 40 minutes. If bleeding continues, page your  physician, contact an After Hours clinic or go to the Emergency Room.   Due to recent changes in healthcare laws, you may see results of your pathology and/or laboratory studies on MyChart before the doctors have had a chance to review them. We understand that in some cases there may be results that are confusing or concerning to you. Please understand that not all results are received at the same time and often the doctors may need to interpret multiple results in order to provide you with the best plan of care or course of treatment. Therefore, we ask that you please give us 2 business days to thoroughly review all your results before contacting the office for clarification. Should we see a critical lab result, you will be contacted sooner.   If You Need Anything After Your Visit  If you have any questions or concerns for your doctor, please call our main line at [phone] and press option 4 to reach your doctor's medical assistant. If no one answers, please leave a voicemail as directed and we will return your call as soon as possible. Messages left after 4 pm will be answered the following business day.   You may also send us a message via MyChart. We typically respond to MyChart messages within 1-2 business days.  For prescription refills, please ask your pharmacy to contact our office. Our fax number is [phone].  If you have an urgent issue when the clinic is closed that cannot wait until the next business day, you can page your doctor at the number below.    Please note that while we do our best to be available for urgent issues outside of office hours, we are not available 24/7.   If you have an urgent issue and are unable to reach us, you may choose to seek medical care at your doctor's office, retail clinic, urgent care center, or emergency room.  If you have a medical emergency, please immediately call 911 or go to the emergency department.  Pager Numbers  - Dr. Kowalski:  [phone]  - Dr. Moye: [phone]  - Dr. Stewart: [phone]  In the event of inclement weather, please call our main line at [phone] for an update on the status of any delays or closures.  Dermatology Medication Tips: Please keep the boxes that topical medications come in in order to help keep track of the instructions about where and how to use these. Pharmacies typically print the medication instructions only on the boxes and not directly on the medication tubes.   If your medication is too expensive, please contact our office at [phone] option 4 or send us a message through MyChart.   We are unable to tell what your co-pay for medications will be in advance as this is different depending on your insurance coverage. However, we may be able to find a substitute medication at lower cost or fill out paperwork to get insurance to cover a needed medication.   If a prior authorization is required to get your medication covered   by your insurance company, please allow us 1-2 business days to complete this process.  Drug prices often vary depending on where the prescription is filled and some pharmacies may offer cheaper prices.  The website www.goodrx.com contains coupons for medications through different pharmacies. The prices here do not account for what the cost may be with help from insurance (it may be cheaper with your insurance), but the website can give you the price if you did not use any insurance.  - You can print the associated coupon and take it with your prescription to the pharmacy.  - You may also stop by our office during regular business hours and pick up a GoodRx coupon card.  - If you need your prescription sent electronically to a different pharmacy, notify our office through [place] MyChart or by phone at [phone] option 4.     Si Usted Necesita Algo Despus de Su Visita  Tambin puede enviarnos un mensaje a travs de MyChart. Por lo general  respondemos a los mensajes de MyChart en el transcurso de 1 a 2 das hbiles.  Para renovar recetas, por favor pida a su farmacia que se ponga en contacto con nuestra oficina. Nuestro nmero de fax es el [phone].  Si tiene un asunto urgente cuando la clnica est cerrada y que no puede esperar hasta el siguiente da hbil, puede llamar/localizar a su doctor(a) al nmero que aparece a continuacin.   Por favor, tenga en cuenta que aunque hacemos todo lo posible para estar disponibles para asuntos urgentes fuera del horario de oficina, no estamos disponibles las 24 horas del da, los 7 das de la semana.   Si tiene un problema urgente y no puede comunicarse con nosotros, puede optar por buscar atencin mdica  en el consultorio de su doctor(a), en una clnica privada, en un centro de atencin urgente o en una sala de emergencias.  Si tiene una emergencia mdica, por favor llame inmediatamente al 911 o vaya a la sala de emergencias.  Nmeros de bper  - Dr. Kowalski: [phone]  - Dra. Moye: [phone]  - Dra. Stewart: [phone]  En caso de inclemencias del tiempo, por favor llame a nuestra lnea principal al [phone] para una actualizacin sobre el estado de cualquier retraso o cierre.  Consejos para la medicacin en dermatologa: Por favor, guarde las cajas en las que vienen los medicamentos de uso tpico para ayudarle a seguir las instrucciones sobre dnde y cmo usarlos. Las farmacias generalmente imprimen las instrucciones del medicamento slo en las cajas y no directamente en los tubos del medicamento.   Si su medicamento es muy caro, por favor, pngase en contacto con nuestra oficina llamando al [phone] y presione la opcin 4 o envenos un mensaje a travs de MyChart.   No podemos decirle cul ser su copago por los medicamentos por adelantado ya que esto es diferente dependiendo de la cobertura de su seguro. Sin embargo, es posible que podamos encontrar un  medicamento sustituto a menor costo o llenar un formulario para que el seguro cubra el medicamento que se considera necesario.   Si se requiere una autorizacin previa para que su compaa de seguros cubra su medicamento, por favor permtanos de 1 a 2 das hbiles para completar este proceso.  Los precios de los medicamentos varan con frecuencia dependiendo del lugar de dnde se surte la receta y alguna farmacias pueden ofrecer precios ms baratos.  El sitio web www.goodrx.com tiene cupones para medicamentos de diferentes farmacias. Los precios aqu no   tienen en cuenta lo que podra costar con la ayuda del seguro (puede ser ms barato con su seguro), pero el sitio web puede darle el precio si no utiliz ningn seguro.  - Puede imprimir el cupn correspondiente y llevarlo con su receta a la farmacia.  - Tambin puede pasar por nuestra oficina durante el horario de atencin regular y recoger una tarjeta de cupones de GoodRx.  - Si necesita que su receta se enve electrnicamente a una farmacia diferente, informe a nuestra oficina a travs de MyChart de [place] o por telfono llamando al [phone] y presione la opcin 4.   

## 2023-04-27 NOTE — Progress Notes (Signed)
Follow-Up Visit   Subjective  Amy Petty is a 67 y.o. female who presents for the following: Procedure.  Patient presents for WLE of malignant melanoma, biopsy proven, of the right spinal mid upper back.   The following portions of the chart were reviewed this encounter and updated as appropriate:       Review of Systems:  No other skin or systemic complaints except as noted in HPI or Assessment and Plan.  Objective  Well appearing patient in no apparent distress; mood and affect are within normal limits.  A focused examination was performed including face, back. Relevant physical exam findings are noted in the Assessment and Plan.  right spinal mid upper back Pink crusted biopsy scar.    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  Skin excision  Lesion length (cm):  1.1 Lesion width (cm):  0.6 Margin per side (cm):  1 Total excision diameter (cm):  3.1 Informed consent: discussed and consent obtained   Timeout: patient name, date of birth, surgical site, and procedure verified   Procedure prep:  Patient was prepped and draped in usual sterile fashion Prep type:  Povidone-iodine Anesthesia: the lesion was anesthetized in a standard fashion   Anesthetic:  1% lidocaine w/ epinephrine 1-100,000 buffered w/ 8.4% NaHCO3 (Total  29cc - 17cc lido w/epi, 12cc bupivacaine) Instrument used: #15 blade   Hemostasis achieved with: suture, pressure and electrodesiccation   Outcome: patient tolerated procedure well with no complications   Additional details:  Tag at superior 12:00 tip  Skin repair Complexity:  Complex Final length (cm):  6.5 Informed consent: discussed and consent obtained   Reason for type of repair: reduce tension to allow closure, reduce the risk of dehiscence, infection, and necrosis, reduce subcutaneous dead space and avoid a hematoma, allow closure of the large defect, preserve normal anatomical and  functional relationships and enhance both functionality and cosmetic results   Undermining: area extensively undermined   Undermining comment:  3.1 cm Subcutaneous layers (deep stitches):  Suture size:  3-0 Suture type: Vicryl (polyglactin 910)   Stitches:  Buried vertical mattress Fine/surface layer approximation (top stitches):  Suture size:  3-0 Suture type: nylon   Stitches: simple interrupted   Suture removal (days):  7 Hemostasis achieved with: suture and pressure Outcome: patient tolerated procedure well with no complications   Post-procedure details: sterile dressing applied and wound care instructions given   Dressing type: pressure dressing (mupirocin)    Specimen 1 - Surgical pathology Differential Diagnosis: Malignant melanoma Check Margins: Yes [id number] Tag at superior 12:00 tip  Malignant Melanoma, biopsy proven. Level II Breslow's 0.5 mm.  Discussed sending [name] test- pt approves [name] Requisition Form filled out and faxed today.  Start Doxycycline 100mg  1 po bid with food and drink Start Mupirocin oint every day to excision site  Doxycycline should be taken with food to prevent nausea. Do not lay down for 30 minutes after taking. Be cautious with sun exposure and use good sun protection while on this medication. Pregnant women should not take this medication.    Rosacea  History of basal cell carcinoma (BCC) of skin  ROSACEA Exam Mid face erythema with telangiectasias, pink edematous scaly papule of the left nasal dorsum  Chronic and persistent condition with duration or expected duration over one year. Condition is bothersome/symptomatic for patient. Currently flared.   Rosacea is a chronic progressive skin condition usually affecting the face of adults,  causing redness and/or acne bumps. It is treatable but not curable. It sometimes affects the eyes (ocular rosacea) as well. It may respond to topical and/or systemic medication and can flare with  stress, sun exposure, alcohol, exercise, topical steroids (including hydrocortisone/cortisone 10) and some foods.  Daily application of broad spectrum spf 30+ sunscreen to face is recommended to reduce flares.   Treatment Plan Start metronidazole 0.75% cream Apply once to twice daily for rosacea dsp 45g Recheck on f/up  HISTORY OF BASAL CELL CARCINOMA OF THE SKIN - No evidence of recurrence today L alar crease- clear - Recommend regular full body skin exams - Recommend daily broad spectrum sunscreen SPF 30+ to sun-exposed areas, reapply every 2 hours as needed.  - Call if any new or changing lesions are noted between office visits   Return in about 1 week (around 05/04/2023) for post-op.  ICherlyn Petty, CMA, am acting as scribe for Amy Niece, MD .  Documentation: I have reviewed the above documentation for accuracy and completeness, and I agree with the above.  Amy Niece MD

## 2023-04-28 ENCOUNTER — Telehealth: Payer: Self-pay

## 2023-04-28 NOTE — Telephone Encounter (Signed)
Left message for patient to call with any questions or concerns from surgery yesterday.

## 2023-05-05 ENCOUNTER — Encounter: Payer: Medicare Other | Admitting: Dermatology

## 2023-05-05 ENCOUNTER — Ambulatory Visit (INDEPENDENT_AMBULATORY_CARE_PROVIDER_SITE_OTHER): Payer: Medicare Other | Admitting: Dermatology

## 2023-05-05 DIAGNOSIS — C4359 Malignant melanoma of other part of trunk: Secondary | ICD-10-CM

## 2023-05-05 NOTE — Progress Notes (Signed)
   Follow-Up Visit   Subjective  Amy Petty is a 67 y.o. female who presents for the following: Suture removal  Excision pathology showed residual Melanoma in situ, margins free.  The following portions of the chart were reviewed this encounter and updated as appropriate: medications, allergies, medical history  Review of Systems:  No other skin or systemic complaints except as noted in HPI or Assessment and Plan.  Objective  Well appearing patient in no apparent distress; mood and affect are within normal limits.  Areas Examined: back Relevant physical exam findings are noted in the Assessment and Plan.    Assessment & Plan    Encounter for Removal of Sutures - Incision site is clean, dry and intact. - Wound cleansed, sutures removed, wound cleansed and steri strips applied.  - Discussed pathology results showing Residual melanoma in situ, margins free - Patient advised to keep steri-strips dry until they fall off. - Scars remodel for a full year. - Once steri-strips fall off, patient can apply over-the-counter silicone scar cream once to twice a day to help with scar remodeling if desired. - Patient advised to call with any concerns or if they notice any new or changing lesions.  Return October 2024, for TBSE, Hx melanoma.  [name], CMA, am acting as scribe for [name], MD .   Documentation: I have reviewed the above documentation for accuracy and completeness, and I agree with the above.  [name], MD

## 2023-05-05 NOTE — Patient Instructions (Signed)
Due to recent changes in healthcare laws, you may see results of your pathology and/or laboratory studies on MyChart before the doctors have had a chance to review them. We understand that in some cases there may be results that are confusing or concerning to you. Please understand that not all results are received at the same time and often the doctors may need to interpret multiple results in order to provide you with the best plan of care or course of treatment. Therefore, we ask that you please give us 2 business days to thoroughly review all your results before contacting the office for clarification. Should we see a critical lab result, you will be contacted sooner.   If You Need Anything After Your Visit  If you have any questions or concerns for your doctor, please call our main line at [phone] and press option 4 to reach your doctor's medical assistant. If no one answers, please leave a voicemail as directed and we will return your call as soon as possible. Messages left after 4 pm will be answered the following business day.   You may also send us a message via MyChart. We typically respond to MyChart messages within 1-2 business days.  For prescription refills, please ask your pharmacy to contact our office. Our fax number is [phone].  If you have an urgent issue when the clinic is closed that cannot wait until the next business day, you can page your doctor at the number below.    Please note that while we do our best to be available for urgent issues outside of office hours, we are not available 24/7.   If you have an urgent issue and are unable to reach us, you may choose to seek medical care at your doctor's office, retail clinic, urgent care center, or emergency room.  If you have a medical emergency, please immediately call 911 or go to the emergency department.  Pager Numbers  - Dr. Kowalski: [phone]  - Dr. Moye: [phone]  - Dr. Stewart:  [phone]  In the event of inclement weather, please call our main line at [phone] for an update on the status of any delays or closures.  Dermatology Medication Tips: Please keep the boxes that topical medications come in in order to help keep track of the instructions about where and how to use these. Pharmacies typically print the medication instructions only on the boxes and not directly on the medication tubes.   If your medication is too expensive, please contact our office at [phone] option 4 or send us a message through MyChart.   We are unable to tell what your co-pay for medications will be in advance as this is different depending on your insurance coverage. However, we may be able to find a substitute medication at lower cost or fill out paperwork to get insurance to cover a needed medication.   If a prior authorization is required to get your medication covered by your insurance company, please allow us 1-2 business days to complete this process.  Drug prices often vary depending on where the prescription is filled and some pharmacies may offer cheaper prices.  The website www.goodrx.com contains coupons for medications through different pharmacies. The prices here do not account for what the cost may be with help from insurance (it may be cheaper with your insurance), but the website can give you the price if you did not use any insurance.  - You can print the associated coupon and take it with   your prescription to the pharmacy.  - You may also stop by our office during regular business hours and pick up a GoodRx coupon card.  - If you need your prescription sent electronically to a different pharmacy, notify our office through [place] MyChart or by phone at [phone] option 4.     Si Usted Necesita Algo Despus de Su Visita  Tambin puede enviarnos un mensaje a travs de MyChart. Por lo general respondemos a los mensajes de MyChart en el transcurso de 1 a 2  das hbiles.  Para renovar recetas, por favor pida a su farmacia que se ponga en contacto con nuestra oficina. Nuestro nmero de fax es el [phone].  Si tiene un asunto urgente cuando la clnica est cerrada y que no puede esperar hasta el siguiente da hbil, puede llamar/localizar a su doctor(a) al nmero que aparece a continuacin.   Por favor, tenga en cuenta que aunque hacemos todo lo posible para estar disponibles para asuntos urgentes fuera del horario de oficina, no estamos disponibles las 24 horas del da, los 7 das de la semana.   Si tiene un problema urgente y no puede comunicarse con nosotros, puede optar por buscar atencin mdica  en el consultorio de su doctor(a), en una clnica privada, en un centro de atencin urgente o en una sala de emergencias.  Si tiene una emergencia mdica, por favor llame inmediatamente al 911 o vaya a la sala de emergencias.  Nmeros de bper  - Dr. Kowalski: [phone]  - Dra. Moye: [phone]  - Dra. Stewart: [phone]  En caso de inclemencias del tiempo, por favor llame a nuestra lnea principal al [phone] para una actualizacin sobre el estado de cualquier retraso o cierre.  Consejos para la medicacin en dermatologa: Por favor, guarde las cajas en las que vienen los medicamentos de uso tpico para ayudarle a seguir las instrucciones sobre dnde y cmo usarlos. Las farmacias generalmente imprimen las instrucciones del medicamento slo en las cajas y no directamente en los tubos del medicamento.   Si su medicamento es muy caro, por favor, pngase en contacto con nuestra oficina llamando al [phone] y presione la opcin 4 o envenos un mensaje a travs de MyChart.   No podemos decirle cul ser su copago por los medicamentos por adelantado ya que esto es diferente dependiendo de la cobertura de su seguro. Sin embargo, es posible que podamos encontrar un medicamento sustituto a menor costo o llenar un formulario para que el  seguro cubra el medicamento que se considera necesario.   Si se requiere una autorizacin previa para que su compaa de seguros cubra su medicamento, por favor permtanos de 1 a 2 das hbiles para completar este proceso.  Los precios de los medicamentos varan con frecuencia dependiendo del lugar de dnde se surte la receta y alguna farmacias pueden ofrecer precios ms baratos.  El sitio web www.goodrx.com tiene cupones para medicamentos de diferentes farmacias. Los precios aqu no tienen en cuenta lo que podra costar con la ayuda del seguro (puede ser ms barato con su seguro), pero el sitio web puede darle el precio si no utiliz ningn seguro.  - Puede imprimir el cupn correspondiente y llevarlo con su receta a la farmacia.  - Tambin puede pasar por nuestra oficina durante el horario de atencin regular y recoger una tarjeta de cupones de GoodRx.  - Si necesita que su receta se enve electrnicamente a una farmacia diferente, informe a nuestra oficina a travs de MyChart de [place]   o por telfono llamando al [phone] y presione la opcin 4.  

## 2023-05-06 ENCOUNTER — Telehealth: Payer: Self-pay

## 2023-05-06 NOTE — Telephone Encounter (Signed)
[facility] testing results scanned in under the media tab for your review. 

## 2023-05-29 ENCOUNTER — Other Ambulatory Visit: Payer: Self-pay | Admitting: Internal Medicine

## 2023-06-23 ENCOUNTER — Encounter: Payer: Self-pay | Admitting: Internal Medicine

## 2023-06-23 DIAGNOSIS — I152 Hypertension secondary to endocrine disorders: Secondary | ICD-10-CM

## 2023-06-25 ENCOUNTER — Encounter (INDEPENDENT_AMBULATORY_CARE_PROVIDER_SITE_OTHER): Payer: Self-pay

## 2023-06-25 NOTE — Telephone Encounter (Signed)
She does not have any more samples. Would you like for me to set one aside for her?

## 2023-06-26 NOTE — Assessment & Plan Note (Signed)
Patient has been unable to lose or maintain a healthy weight despite good effort. Encouraged to increase exercise involvement to include  walking  for 30 minutes 5 days per week.  Screened for contraindications to use of  GLP 1 agonists for appetite suppression and she has none.  The risks and benefits of pharmacotherapy discussed and she is requesting a trial of therapy .  rx written for [facility],  but PA is needed  so she was given a 4 week sample  of ozempic 0.25 mg weekly;  first dose given today

## 2023-06-26 NOTE — Telephone Encounter (Signed)
YES , IF WE HAVE 2 OF THE 0.25 MG DOSE . IF NOT,  WE CAN GIVE HER THE HIGERH DOSE PENS WITH A CONVERSION TABLE   OZEMPIC PENS CAN BE MANIPULATED INTO DELIVERING LOWER DOSES BY "COUNTING CLICKS"    IF YOU ARE USING THE 8 MG PEN (GOLD COLOR) THAT DELIVERS THE 2 MG WEEKLY DOSE  10 CLICKS   0.25 MG DOSE 19 CLICK     0.50 MG DOSE 37 CLICKS   1.0 MG DOSE 56 CLICKS   1.5 MG DOSE    THE 4 MG PEN  (TEAL( THAT DELIVERS THE 1 MG WEEKLY DOSE:  19 CLICKS    0.25 MG DOSE 37 CLICKS    0.50 MG DOSE

## 2023-06-27 ENCOUNTER — Other Ambulatory Visit: Payer: Self-pay | Admitting: Internal Medicine

## 2023-06-29 NOTE — Telephone Encounter (Signed)
Pt picked up to samples of the 0.25/0.5 mg dose today. Is it okay to refuse refill?

## 2023-06-29 NOTE — Telephone Encounter (Signed)
Medication Samples have been provided to the patient.  Drug name: Ozempic       Strength: 2mg /34ml        Qty: 2  LOT: [id number] AND [id number]  Exp.Date: 11/10/23 AND 05/09/24  Dosing instructions: 0.5MG  once per week.   The patient has been instructed regarding the correct time, dose, and frequency of taking this medication, including desired effects and most common side effects.   Amy Petty 9:40 AM 06/29/2023

## 2023-07-01 ENCOUNTER — Other Ambulatory Visit: Payer: Self-pay | Admitting: Internal Medicine

## 2023-07-18 ENCOUNTER — Other Ambulatory Visit: Payer: Self-pay | Admitting: Internal Medicine

## 2023-07-19 ENCOUNTER — Other Ambulatory Visit: Payer: Self-pay | Admitting: Internal Medicine

## 2023-07-29 ENCOUNTER — Encounter: Payer: Self-pay | Admitting: Internal Medicine

## 2023-07-29 ENCOUNTER — Ambulatory Visit (INDEPENDENT_AMBULATORY_CARE_PROVIDER_SITE_OTHER): Payer: Medicare Other | Admitting: Internal Medicine

## 2023-07-29 VITALS — BP 128/80 | HR 79 | Temp 97.4°F | Ht 69.0 in | Wt 264.8 lb

## 2023-07-29 DIAGNOSIS — E669 Obesity, unspecified: Secondary | ICD-10-CM

## 2023-07-29 DIAGNOSIS — I471 Supraventricular tachycardia, unspecified: Secondary | ICD-10-CM | POA: Diagnosis not present

## 2023-07-29 DIAGNOSIS — I152 Hypertension secondary to endocrine disorders: Secondary | ICD-10-CM | POA: Diagnosis not present

## 2023-07-29 DIAGNOSIS — E1169 Type 2 diabetes mellitus with other specified complication: Secondary | ICD-10-CM | POA: Diagnosis not present

## 2023-07-29 DIAGNOSIS — E1159 Type 2 diabetes mellitus with other circulatory complications: Secondary | ICD-10-CM

## 2023-07-29 DIAGNOSIS — E785 Hyperlipidemia, unspecified: Secondary | ICD-10-CM | POA: Diagnosis not present

## 2023-07-29 LAB — COMPREHENSIVE METABOLIC PANEL WITH GFR
ALT: 18 U/L (ref 0–35)
AST: 17 U/L (ref 0–37)
Albumin: 4.4 g/dL (ref 3.5–5.2)
Alkaline Phosphatase: 105 U/L (ref 39–117)
BUN: 20 mg/dL (ref 6–23)
CO2: 31 meq/L (ref 19–32)
Calcium: 10.1 mg/dL (ref 8.4–10.5)
Chloride: 100 meq/L (ref 96–112)
Creatinine, Ser: 0.79 mg/dL (ref 0.40–1.20)
GFR: 77.96 mL/min (ref >60.00)
Glucose, Bld: 100 mg/dL — ABNORMAL HIGH (ref 70–99)
Potassium: 5 meq/L (ref 3.5–5.1)
Sodium: 138 meq/L (ref 135–145)
Total Bilirubin: 0.5 mg/dL (ref 0.2–1.2)
Total Protein: 7.6 g/dL (ref 6.0–8.3)

## 2023-07-29 LAB — LIPID PANEL
Cholesterol: 150 mg/dL (ref 0–200)
HDL: 47.1 mg/dL (ref >39.00)
LDL Cholesterol: 75 mg/dL (ref 0–99)
NonHDL: 102.43
Total CHOL/HDL Ratio: 3
Triglycerides: 139 mg/dL (ref 0.0–149.0)
VLDL: 27.8 mg/dL (ref 0.0–40.0)

## 2023-07-29 LAB — CBC WITH DIFFERENTIAL/PLATELET
Basophils Absolute: 0 10*3/uL (ref 0.0–0.1)
Basophils Relative: 0.3 % (ref 0.0–3.0)
Eosinophils Absolute: 0.4 10*3/uL (ref 0.0–0.7)
Eosinophils Relative: 4.1 % (ref 0.0–5.0)
HCT: 44.4 % (ref 36.0–46.0)
Hemoglobin: 14.3 g/dL (ref 12.0–15.0)
Lymphocytes Relative: 32.8 % (ref 12.0–46.0)
Lymphs Abs: 3.1 10*3/uL (ref 0.7–4.0)
MCHC: 32.2 g/dL (ref 30.0–36.0)
MCV: 94.9 fl (ref 78.0–100.0)
Monocytes Absolute: 0.5 10*3/uL (ref 0.1–1.0)
Monocytes Relative: 5.8 % (ref 3.0–12.0)
Neutro Abs: 5.3 10*3/uL (ref 1.4–7.7)
Neutrophils Relative %: 57 % (ref 43.0–77.0)
Platelets: 306 10*3/uL (ref 150.0–400.0)
RBC: 4.68 Mil/uL (ref 3.87–5.11)
RDW: 14.6 % (ref 11.5–15.5)
WBC: 9.4 10*3/uL (ref 4.0–10.5)

## 2023-07-29 LAB — LDL CHOLESTEROL, DIRECT: Direct LDL: 96 mg/dL

## 2023-07-29 LAB — TSH: TSH: 2.49 u[IU]/mL (ref 0.35–5.50)

## 2023-07-29 LAB — HEMOGLOBIN A1C: Hgb A1c MFr Bld: 5.9 % (ref 4.6–6.5)

## 2023-07-29 NOTE — Patient Instructions (Addendum)
YOU CAN CONTINUE MILD OF MAGNESIUM NIGHTLY to manage the constipation caused by Ozempic  Continue 0.5 mg weekly ozempic for at least 2 more doses. AFTER THAT YOU MAY INCREASE YOUR DOSE IF YOUR WEIGHT HAS PLATEAUED,   Your NEW pen is the next higher dose (1 mg) ,  BUT:     OZEMPIC PENS CAN BE MANIPULATED INTO DELIVERING LOWER DOSES BY "COUNTING CLICKS"  SO IF YOU WANT TO REMAIN AT 0.5 MG AFTER 2 WEEKS USE THE TABLE BELOW:   THE 4 MG PEN  (TEAL MARKINGS ON BOX)  THAT DELIVERS THE 1 MG WEEKLY DOSE:  19 CLICKS    0.25 MG DOSE 37 CLICKS    0.50 MG DOSE      You are due for your tetanus-diptheria-pertussis vaccine   (TDaP)   it is free  at your pharmacy

## 2023-07-29 NOTE — Assessment & Plan Note (Signed)
Patient IS LOSING WEIGHT AND TOLERATING OZEMPIC at the 0.5 mg dose which sh has taken for 2 weeks.  She has  been advised to continue 0.5 mg dose for 2 more weeks and increase dos to 1mg  when weight plateaus

## 2023-07-29 NOTE — Progress Notes (Unsigned)
Patient ID: Amy Petty, female    DOB: 06-Jan-1956  Age: 67 y.o. MRN: [id number]  The patient is here for FOLLOW UP AND and management of other chronic and acute problems.   The risk factors are reflected in the social history.   The roster of all physicians providing medical care to patient - is listed in the Snapshot section of the chart.   Activities of daily living:  The patient is 100% independent in all ADLs: dressing, toileting, feeding as well as independent mobility   Home safety : The patient has smoke detectors in the home. They wear seatbelts.  There are no unsecured firearms at home. There is no violence in the home.    There is no risks for hepatitis, STDs or HIV. There is no   history of blood transfusion. They have no travel history to infectious disease endemic areas of the world.   The patient has seen their dentist in the last six month. They have seen their eye doctor in the last year. The patinet  denies slight hearing difficulty with regard to whispered voices and some television programs.  They have deferred audiologic testing in the last year.  They do not  have excessive sun exposure. Discussed the need for sun protection: hats, long sleeves and use of sunscreen if there is significant sun exposure.    Diet: the importance of a healthy diet is discussed. They do have a healthy diet.   The benefits of regular aerobic exercise were discussed. The patient  exercises  3 to 5 days per week  for  60 minutes.    Depression screen: there are no signs or vegative symptoms of depression- irritability, change in appetite, anhedonia, sadness/tearfullness.   The following portions of the patient's history were reviewed and updated as appropriate: allergies, current medications, past family history, past medical history,  past surgical history, past social history  and problem list.   Visual acuity was not assessed per patient preference since the patient has regular follow up with  an  ophthalmologist. Hearing and body mass index were assessed and reviewed.    During the course of the visit the patient was educated and counseled about appropriate screening and preventive services including : fall prevention , diabetes screening, nutrition counseling, colorectal cancer screening, and recommended immunizations.    Chief Complaint:    1) DIABETES HYPERTENSION OBESITY:  HAS BEEN TAKING OZEMPIC 0.5 MG WEEKLY FOR 2 WEEKS USING THE SAMPLES GIVEN,  SHE HAS THE 1 MG PEN THAT SHE PICKED UP FROM [employer].  SHE IS TOLERATING THE DOSE WITHOUT NAUSEA BUT IS HAVING CONSTIPATION   2) sae the eye doctor (retina specialist) yesterday and Dr Alvester Morin last month fo rcataracts follow up   Review of Symptoms  Patient denies headache, fevers, malaise, unintentional weight loss, skin rash, eye pain, sinus congestion and sinus pain, sore throat, dysphagia,  hemoptysis , cough, dyspnea, wheezing, chest pain, palpitations, orthopnea, edema, abdominal pain, nausea, melena, diarrhea, constipation, flank pain, dysuria, hematuria, urinary  Frequency, nocturia, numbness, tingling, seizures,  Focal weakness, Loss of consciousness,  Tremor, insomnia, depression, anxiety, and suicidal ideation.    Physical Exam:  BP 128/80   Pulse 79   Temp (!) 97.4 F (36.3 C) (Oral)   Ht 5\' 9"  (1.753 m)   Wt 264 lb 12.8 oz (120.1 kg)   LMP 11/23/2013   SpO2 94%   BMI 39.10 kg/m    Physical Exam Vitals reviewed.  Constitutional:  General: She is not in acute distress.    Appearance: Normal appearance. She is normal weight. She is not ill-appearing, toxic-appearing or diaphoretic.  HENT:     Head: Normocephalic.  Eyes:     General: No scleral icterus.       Right eye: No discharge.        Left eye: No discharge.     Conjunctiva/sclera: Conjunctivae normal.  Cardiovascular:     Rate and Rhythm: Normal rate and regular rhythm.     Heart sounds: Normal heart sounds.  Pulmonary:     Effort: Pulmonary effort  is normal. No respiratory distress.     Breath sounds: Normal breath sounds.  Musculoskeletal:        General: Normal range of motion.  Skin:    General: Skin is warm and dry.  Neurological:     General: No focal deficit present.     Mental Status: She is alert and oriented to person, place, and time. Mental status is at baseline.  Psychiatric:        Mood and Affect: Mood normal.        Behavior: Behavior normal.        Thought Content: Thought content normal.        Judgment: Judgment normal.   Assessment and Plan: Obesity, diabetes, and hypertension syndrome (HCC) Assessment & Plan: Patient IS LOSING WEIGHT AND TOLERATING OZEMPIC at the 0.5 mg dose which sh has taken for 2 weeks.  She has  been advised to continue 0.5 mg dose for 2 more weeks and increase dos to 1mg  when weight plateaus  Orders: -     Comprehensive metabolic panel -     Hemoglobin A1c  Hyperlipidemia LDL goal <100 -     Lipid panel -     LDL cholesterol, direct  Morbid obesity (HCC) -     CBC with Differential/Platelet  SVT (supraventricular tachycardia) -     TSH    Return in about 3 months (around 10/28/2023) for follow up diabetes.  Sherlene Shams, MD

## 2023-08-19 ENCOUNTER — Ambulatory Visit (INDEPENDENT_AMBULATORY_CARE_PROVIDER_SITE_OTHER): Payer: Medicare Other | Admitting: *Deleted

## 2023-08-19 VITALS — Ht 69.0 in | Wt 260.0 lb

## 2023-08-19 DIAGNOSIS — Z0000 Encounter for general adult medical examination without abnormal findings: Secondary | ICD-10-CM

## 2023-08-19 NOTE — Progress Notes (Signed)
Subjective:   Amy Petty is a 67 y.o. female who presents for an Initial Medicare Annual Wellness Visit.  Visit Complete: Virtual I connected with  Amy Petty on 08/19/23 by a audio enabled telemedicine application and verified that I am speaking with the correct person using two identifiers.  Patient Location: Home  Provider Location: Office/Clinic  I discussed the limitations of evaluation and management by telemedicine. The patient expressed understanding and agreed to proceed.  Vital Signs: Because this visit was a virtual/telehealth visit, some criteria may be missing or patient reported. Any vitals not documented were not able to be obtained and vitals that have been documented are patient reported.  Patient Medicare AWV questionnaire was completed by the patient on 08/18/23; I have confirmed that all information answered by patient is correct and no changes since this date.  Cardiac Risk Factors include: advanced age (>31men, >76 women);diabetes mellitus;dyslipidemia;hypertension;obesity (BMI >30kg/m2);Other (see comment), Risk factor comments: SVT     Objective:    Today's Vitals   08/19/23 0955  Weight: 260 lb (117.9 kg)  Height: 5\' 9"  (1.753 m)   Body mass index is 38.4 kg/m.     08/19/2023   10:12 AM 09/05/2019    8:46 AM 08/03/2015    3:31 PM  Advanced Directives  Does Patient Have a Medical Advance Directive? Yes Yes No  Type of Estate agent of [place];Living will Living will   Does patient want to make changes to medical advance directive?  No - Patient declined   Copy of Healthcare Power of Attorney in Chart? No - copy requested      Current Medications (verified) Outpatient Encounter Medications as of 08/19/2023  Medication Sig   acetaminophen (TYLENOL) 650 MG CR tablet Take 650 mg by mouth 2 (two) times daily.   albuterol (PROAIR HFA) 108 (90 Base) MCG/ACT inhaler Inhale 2 puffs into the lungs every 6 (six) hours as needed for  wheezing or shortness of breath.   amLODipine (NORVASC) 2.5 MG tablet TAKE ONE TABLET BY MOUTH ONE TIME DAILY   Ascorbic Acid (VITAMIN C) 1000 MG tablet Take 1,000 mg by mouth daily.   atorvastatin (LIPITOR) 10 MG tablet Take 1 tablet (10 mg total) by mouth daily.   cholecalciferol (VITAMIN D) 1000 units tablet Take 1,000 Units by mouth daily.   cyanocobalamin (VITAMIN B12) 1000 MCG tablet Take 1,000 mcg by mouth daily.   ELDERBERRY PO Take by mouth at bedtime.   ketoconazole (NIZORAL) 2 % shampoo WASH SCALP 3-4 TIMES PER WEEK. LET SIT SEVERAL MINUTES BEFORE RINSING   Melatonin 2.5 MG CHEW Chew 1 tablet by mouth at bedtime.   metoprolol succinate (TOPROL-XL) 100 MG 24 hr tablet TAKE ONE TABLET BY MOUTH ONE TIME DAILY WITH OR IMMEDIATELY FOLLOWING A MEAL   metroNIDAZOLE (METROCREAM) 0.75 % cream Apply to face once to twice daily for rosacea.   montelukast (SINGULAIR) 10 MG tablet TAKE ONE TABLET BY MOUTH AT BEDTIME   Multiple Vitamin (MULTIVITAMIN) tablet Take 1 tablet by mouth daily.     Multiple Vitamins-Minerals (HAIR SKIN & NAILS PO) Take by mouth daily.   naproxen sodium (ALEVE) 220 MG tablet Take 220 mg by mouth 2 (two) times daily.   omeprazole (PRILOSEC) 40 MG capsule TAKE ONE CAPSULE BY MOUTH ONE TIME DAILY   Semaglutide, 1 MG/DOSE, (OZEMPIC, 1 MG/DOSE,) 4 MG/3ML SOPN INJECT 1MG  UNDER THE SKIN EVERY WEEK   spironolactone (ALDACTONE) 50 MG tablet TAKE ONE TABLET BY MOUTH ONE TIME  DAILY   triamcinolone (NASACORT ALLERGY 24HR) 55 MCG/ACT AERO nasal inhaler Place 2 sprays into the nose at bedtime.   [DISCONTINUED] mupirocin ointment (BACTROBAN) 2 % Apply 1 Application topically daily. Every day to excision site (Patient not taking: Reported on 08/19/2023)   No facility-administered encounter medications on file as of 08/19/2023.    Allergies (verified) Penicillins and Sulfonamide derivatives   History: Past Medical History:  Diagnosis Date   Actinic keratosis    Anemia    iron  deficiency, due to fibroids   Arthritis    Basal cell carcinoma 06/30/2017   L nasal tip, [facility] 08/31/2017   Basal cell carcinoma 12/24/2022   L ant nasal ala, Mohs [facility] 01/21/2023   Bronchial asthma    Diabetes mellitus without complication (HCC)    Diastolic dysfunction    Grade 1 per echo Oct 2012; EF is normal.   Fibroid, uterus    with menorrhagia   GERD (gastroesophageal reflux disease)    Hyperlipidemia    Hypertension    Hypothyroidism    Melanoma (HCC) 04/14/2023   Level II Breslow's 0.5 mm, R spinal mid upper back, WLE 04/27/2023   Motion sickness    Obesity    PVC's (premature ventricular contractions)    SVT (supraventricular tachycardia) (HCC)    Past Surgical History:  Procedure Laterality Date   CARPAL TUNNEL RELEASE     CARPAL TUNNEL RELEASE  2008   right hand. wears  brace on left   COLONOSCOPY WITH PROPOFOL N/A 09/05/2019   Procedure: COLONOSCOPY WITH PROPOFOL;  Surgeon: Midge Minium, MD;  Location: [facility];  Service: Endoscopy;  Laterality: N/A;   TONSILLECTOMY AND ADENOIDECTOMY     TUBAL LIGATION     Family History  Problem Relation Age of Onset   Cancer Mother        Lung Ca second hand smoke   Lung cancer Mother    Breast cancer Mother 45   Cancer Father        liver and prostate Ca   Liver cancer Father    Valvular heart disease Father        Valve replaced in late 41's.   Diabetes Sister    Social History   Socioeconomic History   Marital status: Married    Spouse name: Not on file   Number of children: Not on file   Years of education: Not on file   Highest education level: Not on file  Occupational History   Not on file  Tobacco Use   Smoking status: Never   Smokeless tobacco: Never  Vaping Use   Vaping status: Never Used  Substance and Sexual Activity   Alcohol use: No   Drug use: No   Sexual activity: Yes  Other Topics Concern   Not on file  Social History Narrative   Married    Social Determinants of  Health   Financial Resource Strain: Low Risk  (08/18/2023)   Overall Financial Resource Strain (CARDIA)    Difficulty of Paying Living Expenses: Not hard at all  Food Insecurity: No Food Insecurity (08/18/2023)   Hunger Vital Sign    Worried About Running Out of Food in the Last Year: Never true    Ran Out of Food in the Last Year: Never true  Transportation Needs: No Transportation Needs (08/18/2023)   PRAPARE - Administrator, Civil Service (Medical): No    Lack of Transportation (Non-Medical): No  Physical Activity: Insufficiently Active (08/18/2023)  Exercise Vital Sign    Days of Exercise per Week: 2 days    Minutes of Exercise per Session: 30 min  Stress: No Stress Concern Present (08/18/2023)   [employer] of Occupational Health - Occupational Stress Questionnaire    Feeling of Stress : Not at all  Social Connections: Socially Integrated (08/18/2023)   Social Connection and Isolation Panel [NHANES]    Frequency of Communication with Friends and Family: More than three times a week    Frequency of Social Gatherings with Friends and Family: More than three times a week    Attends Religious Services: More than 4 times per year    Active Member of [employer] or Organizations: Yes    Attends Engineer, structural: More than 4 times per year    Marital Status: Married    Tobacco Counseling Counseling given: Not Answered   Clinical Intake:  Pre-visit preparation completed: Yes  Pain : No/denies pain     BMI - recorded: 38.4 Nutritional Status: BMI > 30  Obese Nutritional Risks: None Diabetes: Yes CBG done?: No Did pt. bring in CBG monitor from home?: No  How often do you need to have someone help you when you read instructions, pamphlets, or other written materials from your doctor or pharmacy?: 1 - Never  Interpreter Needed?: No  Information entered by :: R. Marykathleen Russi LPN   Activities of Daily Living    08/18/2023    9:38 PM  In your present state  of health, do you have any difficulty performing the following activities:  Hearing? 0  Vision? 0  Difficulty concentrating or making decisions? 0  Walking or climbing stairs? 0  Dressing or bathing? 0  Doing errands, shopping? 0  Preparing Food and eating ? N  Using the Toilet? N  In the past six months, have you accidently leaked urine? N  Do you have problems with loss of bowel control? N  Managing your Medications? N  Managing your Finances? N  Housekeeping or managing your Housekeeping? N    Patient Care Team: Sherlene Shams, MD as PCP - General (Internal Medicine)  Indicate any recent Medical Services you may have received from other than [facility] providers in the past year (date may be approximate).     Assessment:   This is a routine wellness examination for [place].  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 0 0 0 0 0  Number falls in past yr: 0 0 0  0   Injury with Fall? 0 0 0 0   Risk for fall due to : No Fall Risks No Fall Risks No Fall Risks No Fall Risks No Fall Risks  Follow up Falls prevention discussed;Falls evaluation completed Falls evaluation completed Falls evaluation completed Falls evaluation completed Falls evaluation completed    MEDICARE RISK AT HOME: Medicare Risk at Home Any stairs in or around the home?: Yes If so, are  there any without handrails?: No Home free of loose throw rugs in walkways, pet beds, electrical cords, etc?: Yes Adequate lighting in your home to reduce risk of falls?: Yes Life alert?: No Use of a cane, walker or w/c?: No Grab bars in the bathroom?: No Shower chair or bench in shower?: No Elevated toilet seat or a handicapped toilet?: No      Cognitive Function:        08/19/2023   10:13 AM  6CIT Screen  What Year? 0 points  What month? 0 points  What time? 0 points  Count back from 20 0 points  Months in reverse 0 points  Repeat phrase 0 points  Total Score 0 points    Immunizations Immunization History  Administered Date(s) Administered   Fluad Quad(high Dose 65+) 08/25/2022   Influenza Inj Mdck Quad Pf 08/19/2019   Influenza Split 08/20/2011, 08/11/2012   Influenza,inj,Quad PF,6+ Mos 08/15/2013, 09/26/2014, 09/04/2015, 08/05/2016, 09/18/2017, 07/21/2018, 09/09/2021   PFIZER(Purple Top)SARS-COV-2 Vaccination 02/25/2020, 03/24/2020   PNEUMOCOCCAL CONJUGATE-20 04/14/2022   Respiratory Syncytial Virus Vaccine,Recomb Aduvanted(Arexvy) 10/27/2022   Tdap 08/20/2011    TDAP status: Due, Education has been provided regarding the importance of this vaccine. Advised may receive this vaccine at local pharmacy or Health Dept. Aware to provide a copy of the vaccination record if obtained from local pharmacy or Health Dept. Verbalized acceptance and understanding.  Flu Vaccine status: Due, Education has been provided regarding the importance of this vaccine. Advised may receive this vaccine at local pharmacy or Health Dept. Aware to provide a copy of the vaccination record if obtained from local pharmacy or Health Dept. Verbalized acceptance and understanding.  Pneumococcal vaccine status: Up to date  Covid-19 vaccine status: Information provided on how to obtain vaccines.   Qualifies for Shingles Vaccine? Yes   Zostavax completed No   Shingrix Completed?: No.    Education has  been provided regarding the importance of this vaccine. Patient has been advised to call insurance company to determine out of pocket expense if they have not yet received this vaccine. Advised may also receive vaccine at local pharmacy or Health Dept. Verbalized acceptance and understanding.  Screening Tests Health Maintenance  Topic Date Due   Medicare Annual Wellness (AWV)  Never done   Zoster Vaccines- Shingrix (1 of 2) Never done   COVID-19 Vaccine (3 - Pfizer risk series) 04/21/2020   DTaP/Tdap/Td (2 - Td or Tdap) 08/19/2021   INFLUENZA VACCINE  02/08/2024 (Originally 06/11/2023)   OPHTHALMOLOGY EXAM  11/13/2023   HEMOGLOBIN A1C  01/26/2024   Diabetic kidney evaluation - Urine ACR  04/12/2024   MAMMOGRAM  04/22/2024   Diabetic kidney evaluation - eGFR measurement  07/28/2024   FOOT EXAM  07/28/2024   Colonoscopy  09/04/2029   Pneumonia Vaccine 78+ Years old  Completed   DEXA SCAN  Completed   Hepatitis C Screening  Completed   HPV VACCINES  Aged Out    Health Maintenance  Health Maintenance Due  Topic Date Due   Medicare Annual Wellness (AWV)  Never done  Zoster Vaccines- Shingrix (1 of 2) Never done   COVID-19 Vaccine (3 - Pfizer risk series) 04/21/2020   DTaP/Tdap/Td (2 - Td or Tdap) 08/19/2021    Colorectal cancer screening: Type of screening: Colonoscopy. Completed 08/2019. Repeat every 10 years  Mammogram status: Completed 04/2023. Repeat every year  Bone Density status: Completed 05/2022. Results reflect: Bone density results: NORMAL. Repeat every 2 years.  Lung Cancer Screening: (Low Dose CT Chest recommended if Age 26-80 years, 20 pack-year currently smoking OR have quit w/in 15years.) does not qualify.     Additional Screening:  Hepatitis C Screening: does qualify; Completed 10/2015  Vision Screening: Recommended annual ophthalmology exams for early detection of glaucoma and other disorders of the eye. Is the patient up to date with their annual eye exam?   Yes  Who is the provider or what is the name of the office in which the patient attends annual eye exams? Dr. Alvester Morin If pt is not established with a provider, would they like to be referred to a provider to establish care? No .   Dental Screening: Recommended annual dental exams for proper oral hygiene  Diabetic Foot Exam: Diabetic Foot Exam: Completed 07/2023  Community Resource Referral / Chronic Care Management: CRR required this visit?  No   CCM required this visit?  No     Plan:     I have personally reviewed and noted the following in the patient's chart:   Medical and social history Use of alcohol, tobacco or illicit drugs  Current medications and supplements including opioid prescriptions. Patient is not currently taking opioid prescriptions. Functional ability and status Nutritional status Physical activity Advanced directives List of other physicians Hospitalizations, surgeries, and ER visits in previous 12 months Vitals Screenings to include cognitive, depression, and falls Referrals and appointments  In addition, I have reviewed and discussed with patient certain preventive protocols, quality metrics, and best practice recommendations. A written personalized care plan for preventive services as well as general preventive health recommendations were provided to patient.     Sydell Axon, LPN   16/11/958   After Visit Summary: (MyChart) Due to this being a telephonic visit, the after visit summary with patients personalized plan was offered to patient via MyChart   Nurse Notes: None

## 2023-08-19 NOTE — Patient Instructions (Signed)
Amy Petty , Thank you for taking time to come for your Medicare Wellness Visit. I appreciate your ongoing commitment to your health goals. Please review the following plan we discussed and let me know if I can assist you in the future.   Referrals/Orders/Follow-Ups/Clinician Recommendations: Remember to update your vaccines  This is a list of the screening recommended for you and due dates:  Health Maintenance  Topic Date Due   Zoster (Shingles) Vaccine (1 of 2) Never done   COVID-19 Vaccine (3 - Pfizer risk series) 04/21/2020   DTaP/Tdap/Td vaccine (2 - Td or Tdap) 08/19/2021   Flu Shot  02/08/2024*   Eye exam for diabetics  11/13/2023   Hemoglobin A1C  01/26/2024   Yearly kidney health urinalysis for diabetes  04/12/2024   Mammogram  04/22/2024   Yearly kidney function blood test for diabetes  07/28/2024   Complete foot exam   07/28/2024   Medicare Annual Wellness Visit  08/18/2024   Colon Cancer Screening  09/04/2029   Pneumonia Vaccine  Completed   DEXA scan (bone density measurement)  Completed   Hepatitis C Screening  Completed   HPV Vaccine  Aged Out  *Topic was postponed. The date shown is not the original due date.    Advanced directives: (Copy Requested) Please bring a copy of your health care power of attorney and living will to the office to be added to your chart at your convenience.  Next Medicare Annual Wellness Visit scheduled for next year: Yes 08/22/24 @ 11:15

## 2023-09-08 ENCOUNTER — Ambulatory Visit (INDEPENDENT_AMBULATORY_CARE_PROVIDER_SITE_OTHER): Payer: Medicare Other | Admitting: Dermatology

## 2023-09-08 DIAGNOSIS — L578 Other skin changes due to chronic exposure to nonionizing radiation: Secondary | ICD-10-CM | POA: Diagnosis not present

## 2023-09-08 DIAGNOSIS — L814 Other melanin hyperpigmentation: Secondary | ICD-10-CM

## 2023-09-08 DIAGNOSIS — Z85828 Personal history of other malignant neoplasm of skin: Secondary | ICD-10-CM

## 2023-09-08 DIAGNOSIS — I781 Nevus, non-neoplastic: Secondary | ICD-10-CM

## 2023-09-08 DIAGNOSIS — I8393 Asymptomatic varicose veins of bilateral lower extremities: Secondary | ICD-10-CM

## 2023-09-08 DIAGNOSIS — Z872 Personal history of diseases of the skin and subcutaneous tissue: Secondary | ICD-10-CM

## 2023-09-08 DIAGNOSIS — Z1283 Encounter for screening for malignant neoplasm of skin: Secondary | ICD-10-CM | POA: Diagnosis not present

## 2023-09-08 DIAGNOSIS — L570 Actinic keratosis: Secondary | ICD-10-CM | POA: Diagnosis not present

## 2023-09-08 DIAGNOSIS — D229 Melanocytic nevi, unspecified: Secondary | ICD-10-CM

## 2023-09-08 DIAGNOSIS — D692 Other nonthrombocytopenic purpura: Secondary | ICD-10-CM

## 2023-09-08 DIAGNOSIS — L821 Other seborrheic keratosis: Secondary | ICD-10-CM

## 2023-09-08 DIAGNOSIS — D1801 Hemangioma of skin and subcutaneous tissue: Secondary | ICD-10-CM

## 2023-09-08 DIAGNOSIS — W908XXA Exposure to other nonionizing radiation, initial encounter: Secondary | ICD-10-CM

## 2023-09-08 DIAGNOSIS — L820 Inflamed seborrheic keratosis: Secondary | ICD-10-CM

## 2023-09-08 DIAGNOSIS — Z85820 Personal history of malignant melanoma of skin: Secondary | ICD-10-CM

## 2023-09-08 MED ORDER — MOMETASONE FUROATE 0.1 % EX SOLN: CUTANEOUS | 2 refills | Status: DC

## 2023-09-08 NOTE — Progress Notes (Unsigned)
Follow-Up Visit   Subjective  Amy Petty is a 67 y.o. female who presents for the following: Skin Cancer Screening and Full Body Skin Exam  The patient presents for Total-Body Skin Exam (TBSE) for skin cancer screening and mole check. The patient has spots, moles and lesions to be evaluated, some may be new or changing and the patient may have concern these could be cancer.  Patient with hx of MM, BCC, AK.  The following portions of the chart were reviewed this encounter and updated as appropriate: medications, allergies, medical history  Review of Systems:  No other skin or systemic complaints except as noted in HPI or Assessment and Plan.  Objective  Well appearing patient in no apparent distress; mood and affect are within normal limits.  A full examination was performed including scalp, head, eyes, ears, nose, lips, neck, chest, axillae, abdomen, back, buttocks, bilateral upper extremities, bilateral lower extremities, hands, feet, fingers, toes, fingernails, and toenails. All findings within normal limits unless otherwise noted below.   Relevant physical exam findings are noted in the Assessment and Plan.  R ant shoulder x 1 Erythematous stuck-on, waxy papule  Scalp (10) Erythematous thin papules/macules with gritty scale.     Assessment & Plan   SKIN CANCER SCREENING PERFORMED TODAY.  ACTINIC DAMAGE - Chronic condition, secondary to cumulative UV/sun exposure - diffuse scaly erythematous macules with underlying dyspigmentation - Recommend daily broad spectrum sunscreen SPF 30+ to sun-exposed areas, reapply every 2 hours as needed.  - Staying in the shade or wearing long sleeves, sun glasses (UVA+UVB protection) and wide brim hats (4-inch brim around the entire circumference of the hat) are also recommended for sun protection.  - Call for new or changing lesions.  LENTIGINES, SEBORRHEIC KERATOSES, HEMANGIOMAS - Benign normal skin lesions - Benign-appearing - Call  for any changes  MELANOCYTIC NEVI - Tan-brown and/or pink-flesh-colored symmetric macules and papules - Benign appearing on exam today - Observation - Call clinic for new or changing moles - Recommend daily use of broad spectrum spf 30+ sunscreen to sun-exposed areas.   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  - Call if any new or changing lesions are noted between office visits   HISTORY OF MELANOMA - No evidence of recurrence today at right spinal mid upper back, Level II Breslow's 0.5 mm, WLE 04/27/23, CASTLE 1A - Recommend regular full body skin exams - Recommend daily broad spectrum sunscreen SPF 30+ to sun-exposed areas, reapply every 2 hours as needed.  - Call if any new or changing lesions are noted between office visits   Inflamed seborrheic keratosis R ant shoulder x 1  Symptomatic, irritating, patient would like treated.  Benign-appearing.  Call clinic for new or changing lesions.    Destruction of lesion - R ant shoulder x 1  Destruction method: cryotherapy   Informed consent: discussed and consent obtained   Lesion destroyed using liquid nitrogen: Yes   Region frozen until ice ball extended beyond lesion: Yes   Outcome: patient tolerated procedure well with no complications   Post-procedure details: wound care instructions given   Additional details:  Prior to procedure, discussed risks of blister formation, small wound, skin dyspigmentation, or rare scar following cryotherapy. Recommend Vaseline ointment to treated  areas while healing.   AK (actinic keratosis) (10) Scalp  Vs ISK's vs Seb Derm  Actinic keratoses are precancerous spots that appear secondary to cumulative UV radiation exposure/sun exposure over time. They are  chronic with expected duration over 1 year. A portion of actinic keratoses will progress to squamous cell carcinoma of the skin. It is not possible to reliably predict which spots will progress to skin cancer and so treatment is recommended to prevent development of skin cancer.  Recommend daily broad spectrum sunscreen SPF 30+ to sun-exposed areas, reapply every 2 hours as needed.  Recommend staying in the shade or wearing long sleeves, sun glasses (UVA+UVB protection) and wide brim hats (4-inch brim around the entire circumference of the hat). Call for new or changing lesions.  Start mometasone lotion 1-2 times daily to scalp as needed for itch. Avoid applying to face, groin, and axilla. Use as directed. Long-term use can cause thinning of the skin. Continue ketoconazole 2% shampoo apply three times per week, massage into scalp and leave in for 5-10 minutes before rinsing out  Topical steroids (such as triamcinolone, fluocinolone, fluocinonide, mometasone, clobetasol, halobetasol, betamethasone, hydrocortisone) can cause thinning and lightening of the skin if they are used for too long in the same area. Your physician has selected the right strength medicine for your problem and area affected on the body. Please use your medication only as directed by your physician to prevent side effects.     Destruction of lesion - Scalp (10)  Destruction method: cryotherapy   Informed consent: discussed and consent obtained   Lesion destroyed using liquid nitrogen: Yes   Region frozen until ice ball extended beyond lesion: Yes   Outcome: patient tolerated procedure well with no complications   Post-procedure details: wound care instructions given   Additional details:  Prior to procedure, discussed risks of blister formation, small wound, skin dyspigmentation, or rare scar following cryotherapy. Recommend Vaseline ointment to treated areas while healing.   Purpura - Chronic; persistent and recurrent.   Treatable, but not curable. - Violaceous macules and patches at arms - Benign - Related to trauma, age, sun damage and/or use of blood thinners, chronic use of topical and/or oral steroids - Observe - Can use OTC arnica containing moisturizer such as Dermend Bruise Formula if desired - Call for worsening or other concerns  Varicose Veins/Spider Veins - Dilated blue, purple or red veins at the lower extremities - Reassured - Smaller vessels can be treated by sclerotherapy (a procedure to inject a medicine into the veins to make them disappear) if desired, but the treatment is not covered by insurance. Larger vessels may be covered if symptomatic and we would refer to vascular surgeon if treatment desired.   Return in about 3 months (around 12/09/2023) for TBSE, with Dr. Roseanne Reno, Hx BCC, Hx MM, Hx AK.  Anise Salvo, RMA, am acting as scribe for [name], MD .   Documentation: I have reviewed the above documentation for accuracy and completeness, and I agree with the above.  [name], MD

## 2023-09-08 NOTE — Patient Instructions (Signed)

## 2023-10-12 ENCOUNTER — Other Ambulatory Visit: Payer: Self-pay | Admitting: Internal Medicine

## 2023-10-18 ENCOUNTER — Other Ambulatory Visit: Payer: Self-pay | Admitting: Internal Medicine

## 2023-10-19 ENCOUNTER — Ambulatory Visit: Payer: Medicare Other | Admitting: Dermatology

## 2023-10-28 ENCOUNTER — Ambulatory Visit (INDEPENDENT_AMBULATORY_CARE_PROVIDER_SITE_OTHER): Payer: Medicare Other | Admitting: Internal Medicine

## 2023-10-28 ENCOUNTER — Encounter: Payer: Self-pay | Admitting: Internal Medicine

## 2023-10-28 VITALS — BP 114/72 | HR 83 | Ht 69.0 in | Wt 263.4 lb

## 2023-10-28 DIAGNOSIS — Z6838 Body mass index (BMI) 38.0-38.9, adult: Secondary | ICD-10-CM

## 2023-10-28 DIAGNOSIS — E669 Obesity, unspecified: Secondary | ICD-10-CM | POA: Diagnosis not present

## 2023-10-28 DIAGNOSIS — E1159 Type 2 diabetes mellitus with other circulatory complications: Secondary | ICD-10-CM

## 2023-10-28 DIAGNOSIS — I152 Hypertension secondary to endocrine disorders: Secondary | ICD-10-CM

## 2023-10-28 DIAGNOSIS — Z7985 Long-term (current) use of injectable non-insulin antidiabetic drugs: Secondary | ICD-10-CM

## 2023-10-28 DIAGNOSIS — E1169 Type 2 diabetes mellitus with other specified complication: Secondary | ICD-10-CM | POA: Diagnosis not present

## 2023-10-28 DIAGNOSIS — E875 Hyperkalemia: Secondary | ICD-10-CM

## 2023-10-28 DIAGNOSIS — E785 Hyperlipidemia, unspecified: Secondary | ICD-10-CM | POA: Diagnosis not present

## 2023-10-28 DIAGNOSIS — I471 Supraventricular tachycardia, unspecified: Secondary | ICD-10-CM

## 2023-10-28 DIAGNOSIS — I10 Essential (primary) hypertension: Secondary | ICD-10-CM

## 2023-10-28 DIAGNOSIS — G629 Polyneuropathy, unspecified: Secondary | ICD-10-CM | POA: Diagnosis not present

## 2023-10-28 DIAGNOSIS — T50905A Adverse effect of unspecified drugs, medicaments and biological substances, initial encounter: Secondary | ICD-10-CM

## 2023-10-28 DIAGNOSIS — E119 Type 2 diabetes mellitus without complications: Secondary | ICD-10-CM

## 2023-10-28 LAB — LIPID PANEL
Cholesterol: 149 mg/dL (ref 0–200)
HDL: 48.1 mg/dL (ref >39.00)
LDL Cholesterol: 75 mg/dL (ref 0–99)
NonHDL: 101.27
Total CHOL/HDL Ratio: 3
Triglycerides: 130 mg/dL (ref 0.0–149.0)
VLDL: 26 mg/dL (ref 0.0–40.0)

## 2023-10-28 LAB — COMPREHENSIVE METABOLIC PANEL
ALT: 22 U/L (ref 0–35)
AST: 21 U/L (ref 0–37)
Albumin: 4.4 g/dL (ref 3.5–5.2)
Alkaline Phosphatase: 105 U/L (ref 39–117)
BUN: 18 mg/dL (ref 6–23)
CO2: 31 meq/L (ref 19–32)
Calcium: 10 mg/dL (ref 8.4–10.5)
Chloride: 99 meq/L (ref 96–112)
Creatinine, Ser: 0.76 mg/dL (ref 0.40–1.20)
GFR: 81.53 mL/min (ref >60.00)
Glucose, Bld: 92 mg/dL (ref 70–99)
Potassium: 5.2 meq/L — ABNORMAL HIGH (ref 3.5–5.1)
Sodium: 138 meq/L (ref 135–145)
Total Bilirubin: 0.4 mg/dL (ref 0.2–1.2)
Total Protein: 7.2 g/dL (ref 6.0–8.3)

## 2023-10-28 LAB — HEMOGLOBIN A1C: Hgb A1c MFr Bld: 5.9 % (ref 4.6–6.5)

## 2023-10-28 LAB — LDL CHOLESTEROL, DIRECT: Direct LDL: 85 mg/dL

## 2023-10-28 MED ORDER — SPIRONOLACTONE 50 MG PO TABS: 50.0000 mg | ORAL_TABLET | Freq: Every day | ORAL | 1 refills | Status: DC

## 2023-10-28 MED ORDER — SEMAGLUTIDE (2 MG/DOSE) 8 MG/3ML ~~LOC~~ SOPN: 2.0000 mg | PEN_INJECTOR | SUBCUTANEOUS | 1 refills | Status: DC

## 2023-10-28 MED ORDER — MONTELUKAST SODIUM 10 MG PO TABS: ORAL_TABLET | ORAL | 1 refills | Status: DC

## 2023-10-28 MED ORDER — METOPROLOL SUCCINATE ER 100 MG PO TB24: ORAL_TABLET | ORAL | 1 refills | Status: DC

## 2023-10-28 NOTE — Assessment & Plan Note (Signed)
I have addressed  BMI and recommended wt loss of 10% of body weigh over the next 6 months using a low glycemic index diet and regular exercise a minimum of 5 days per week.  REVIEWED  The risks and benefits of GLP 1 agonist therapy  She has no contraindications b ut has deferred for nowincresae dose to 2 mg  after the holidays .

## 2023-10-28 NOTE — Assessment & Plan Note (Signed)
Chronic,  predates her development of type 2 DM Numbness of both feet ,  toes 1,2,and 3   continues DUE TO SPINAL STENOSIS OF THE LUMBAR SPINE   B12 and folate ordered.  Has lumbar radiculitis.  Lab Results  Component Value Date   HGBA1C 5.9 07/29/2023   No results found for: "RPR" Lab Results  Component Value Date   TSH 2.49 07/29/2023

## 2023-10-28 NOTE — Progress Notes (Unsigned)
Subjective:  Patient ID: Amy Petty, female    DOB: 03/04/1956  Age: 67 y.o. MRN: [id number]  CC: The primary encounter diagnosis was Obesity, diabetes, and hypertension syndrome (HCC). Diagnoses of Hyperlipidemia LDL goal <100, Peripheral polyneuropathy, Morbid obesity (HCC), Primary hypertension, Drug-induced hyperkalemia, SVT (supraventricular tachycardia) (HCC), and Diabetes mellitus treated with injections of non-insulin medication (HCC) were also pertinent to this visit.   HPI Amy Petty presents for  Chief Complaint  Patient presents with   Medical Management of Chronic Issues    3 month follow up    DM/obesity/HTN:  weight is down 25 lbs since FEb but hshe as gained 3 since last visit ;  she is using Ozempic 1 mg dose . Has been doing a lot of christmas cooking .  She is walking daily for  exercise . Hypertension: patient checks blood pressure twice weekly at home.  Readings have been for the most part <130/80 at rest . Patient is following a reduced salt diet most days and is taking medications as prescribed (spironolactone, amlodipine and metoprolol    Outpatient Medications Prior to Visit  Medication Sig Dispense Refill   acetaminophen (TYLENOL) 650 MG CR tablet Take 650 mg by mouth 2 (two) times daily.     albuterol (PROAIR HFA) 108 (90 Base) MCG/ACT inhaler Inhale 2 puffs into the lungs every 6 (six) hours as needed for wheezing or shortness of breath. 1 each 0   amLODipine (NORVASC) 2.5 MG tablet TAKE ONE TABLET BY MOUTH ONE TIME DAILY 90 tablet 1   Ascorbic Acid (VITAMIN C) 1000 MG tablet Take 1,000 mg by mouth daily.     atorvastatin (LIPITOR) 10 MG tablet Take 1 tablet (10 mg total) by mouth daily. 90 tablet 3   cholecalciferol (VITAMIN D) 1000 units tablet Take 1,000 Units by mouth daily.     cyanocobalamin (VITAMIN B12) 1000 MCG tablet Take 1,000 mcg by mouth daily.     ELDERBERRY PO Take by mouth at bedtime.     ketoconazole (NIZORAL) 2 % shampoo WASH SCALP 3-4  TIMES PER WEEK. LET SIT SEVERAL MINUTES BEFORE RINSING  3   Melatonin 2.5 MG CHEW Chew 1 tablet by mouth at bedtime.     metroNIDAZOLE (METROCREAM) 0.75 % cream Apply to face once to twice daily for rosacea. 45 g 5   mometasone (ELOCON) 0.1 % lotion Apply 1-2 times daily to affected areas at scalp as needed for itch. Avoid applying to face, groin, and axilla. Use as directed. Long-term use can cause thinning of the skin. 60 mL 2   Multiple Vitamin (MULTIVITAMIN) tablet Take 1 tablet by mouth daily.       Multiple Vitamins-Minerals (HAIR SKIN & NAILS PO) Take by mouth daily.     naproxen sodium (ALEVE) 220 MG tablet Take 220 mg by mouth 2 (two) times daily.     omeprazole (PRILOSEC) 40 MG capsule TAKE ONE CAPSULE BY MOUTH ONE TIME DAILY 90 capsule 2   triamcinolone (NASACORT ALLERGY 24HR) 55 MCG/ACT AERO nasal inhaler Place 2 sprays into the nose at bedtime.     metoprolol succinate (TOPROL-XL) 100 MG 24 hr tablet TAKE ONE TABLET BY MOUTH ONE TIME DAILY WITH OR IMMEDIATELY FOLLOWING A MEAL 90 tablet 1   montelukast (SINGULAIR) 10 MG tablet TAKE ONE TABLET BY MOUTH AT BEDTIME 90 tablet 1   Semaglutide, 1 MG/DOSE, (OZEMPIC, 1 MG/DOSE,) 4 MG/3ML SOPN INJECT 1MG  UNDER THE SKIN EVERY WEEK 3 mL 2   spironolactone (ALDACTONE)  50 MG tablet TAKE ONE TABLET BY MOUTH ONE TIME DAILY 90 tablet 1   No facility-administered medications prior to visit.    Review of Systems;  Patient denies headache, fevers, malaise, unintentional weight loss, skin rash, eye pain, sinus congestion and sinus pain, sore throat, dysphagia,  hemoptysis , cough, dyspnea, wheezing, chest pain, palpitations, orthopnea, edema, abdominal pain, nausea, melena, diarrhea, constipation, flank pain, dysuria, hematuria, urinary  Frequency, nocturia, numbness, tingling, seizures,  Focal weakness, Loss of consciousness,  Tremor, insomnia, depression, anxiety, and suicidal ideation.      Objective:  BP 114/72   Pulse 83   Ht 5\' 9"  (1.753 m)    Wt 263 lb 6.4 oz (119.5 kg)   LMP 11/23/2013   SpO2 95%   BMI 38.90 kg/m   BP Readings from Last 3 Encounters:  10/28/23 114/72  07/29/23 128/80  04/27/23 115/77    Wt Readings from Last 3 Encounters:  10/28/23 263 lb 6.4 oz (119.5 kg)  08/19/23 260 lb (117.9 kg)  07/29/23 264 lb 12.8 oz (120.1 kg)    Physical Exam Vitals reviewed.  Constitutional:      General: She is not in acute distress.    Appearance: Normal appearance. She is normal weight. She is not ill-appearing, toxic-appearing or diaphoretic.  HENT:     Head: Normocephalic.  Eyes:     General: No scleral icterus.       Right eye: No discharge.        Left eye: No discharge.     Conjunctiva/sclera: Conjunctivae normal.  Cardiovascular:     Rate and Rhythm: Normal rate and regular rhythm.     Heart sounds: Normal heart sounds.  Pulmonary:     Effort: Pulmonary effort is normal. No respiratory distress.     Breath sounds: Normal breath sounds.  Musculoskeletal:        General: Normal range of motion.  Skin:    General: Skin is warm and dry.  Neurological:     General: No focal deficit present.     Mental Status: She is alert and oriented to person, place, and time. Mental status is at baseline.  Psychiatric:        Mood and Affect: Mood normal.        Behavior: Behavior normal.        Thought Content: Thought content normal.        Judgment: Judgment normal.    Lab Results  Component Value Date   HGBA1C 5.9 10/28/2023   HGBA1C 5.9 07/29/2023   HGBA1C 6.4 04/13/2023    Lab Results  Component Value Date   CREATININE 0.76 10/28/2023   CREATININE 0.79 07/29/2023   CREATININE 0.73 04/13/2023    Lab Results  Component Value Date   WBC 9.4 07/29/2023   HGB 14.3 07/29/2023   HCT 44.4 07/29/2023   PLT 306.0 07/29/2023   GLUCOSE 92 10/28/2023   CHOL 149 10/28/2023   TRIG 130.0 10/28/2023   HDL 48.10 10/28/2023   LDLDIRECT 85.0 10/28/2023   LDLCALC 75 10/28/2023   ALT 22 10/28/2023   AST 21  10/28/2023   NA 138 10/28/2023   K 5.2 No hemolysis seen (H) 10/28/2023   CL 99 10/28/2023   CREATININE 0.76 10/28/2023   BUN 18 10/28/2023   CO2 31 10/28/2023   TSH 2.49 07/29/2023   HGBA1C 5.9 10/28/2023   MICROALBUR 1.6 04/13/2023    MM 3D SCREENING MAMMOGRAM BILATERAL BREAST Result Date: 04/24/2023 CLINICAL DATA:  Screening. EXAM: DIGITAL  SCREENING BILATERAL MAMMOGRAM WITH TOMOSYNTHESIS AND CAD TECHNIQUE: Bilateral screening digital craniocaudal and mediolateral oblique mammograms were obtained. Bilateral screening digital breast tomosynthesis was performed. The images were evaluated with computer-aided detection. COMPARISON:  Previous exam(s). ACR Breast Density Category a: The breasts are almost entirely fatty. FINDINGS: There are no findings suspicious for malignancy. IMPRESSION: No mammographic evidence of malignancy. A result letter of this screening mammogram will be mailed directly to the patient. RECOMMENDATION: Screening mammogram in one year. (Code:SM-B-01Y) BI-RADS CATEGORY  1: Negative. Electronically Signed   By: Frederico Hamman M.D.   On: 04/24/2023 15:29    Assessment & Plan:  .Obesity, diabetes, and hypertension syndrome (HCC) Assessment & Plan: Patient's weight loss has plateaued on 1 mg ozempic  She has  been advised to increase dose to 2.0 mg.  A1c Is excellent  Lab Results  Component Value Date   HGBA1C 5.9 10/28/2023   Lab Results  Component Value Date   MICROALBUR 1.6 04/13/2023   MICROALBUR <0.7 04/14/2022       Orders: -     Comprehensive metabolic panel -     Hemoglobin A1c  Hyperlipidemia LDL goal <100 Assessment & Plan: 10 year risk Is elevated due to concurrent diabetes diagnosis   She is tolerating  atorvastatin.    Lab Results  Component Value Date   CHOL 149 10/28/2023   HDL 48.10 10/28/2023   LDLCALC 75 10/28/2023   LDLDIRECT 85.0 10/28/2023   TRIG 130.0 10/28/2023   CHOLHDL 3 10/28/2023     Orders: -     Lipid panel -      LDL cholesterol, direct  Peripheral polyneuropathy Assessment & Plan:  Chronic,  predates her development of type 2 DM Numbness of both feet ,  toes 1,2,and 3   continues DUE TO SPINAL STENOSIS OF THE LUMBAR SPINE   B12 and folate ordered.  Has lumbar radiculitis.  Lab Results  Component Value Date   HGBA1C 5.9 07/29/2023   No results found for: "RPR" Lab Results  Component Value Date   TSH 2.49 07/29/2023      Morbid obesity (HCC) Assessment & Plan: I have addressed  BMI and recommended wt loss of 10% of body weigh over the next 6 months using a low glycemic index diet and regular exercise a minimum of 5 days per week.  REVIEWED  The risks and benefits of GLP 1 agonist therapy  She has no contraindications b ut has deferred for nowincresae dose to 2 mg  after the holidays .    Primary hypertension Assessment & Plan: Well controlled on amlodipine, metoprolol and spironolactone; however potassium needs repeating  Lab Results  Component Value Date   NA 138 10/28/2023   K 5.2 No hemolysis seen (H) 10/28/2023   CL 99 10/28/2023   CO2 31 10/28/2023   Lab Results  Component Value Date   CREATININE 0.76 10/28/2023      Drug-induced hyperkalemia -     Basic metabolic panel; Future  SVT (supraventricular tachycardia) (HCC) Assessment & Plan: Managed with metoprolol. No changes today    Diabetes mellitus treated with injections of non-insulin medication [facility]) Assessment & Plan: Tolerating ozempic  increase dose to 2 mg   No results found for: "LIPASE"    Other orders -     Metoprolol Succinate ER; TAKE ONE TABLET BY MOUTH ONE TIME DAILY WITH OR IMMEDIATELY FOLLOWING A MEAL  Dispense: 90 tablet; Refill: 1 -     Montelukast Sodium; TAKE ONE TABLET BY  MOUTH AT BEDTIME  Dispense: 90 tablet; Refill: 1 -     Spironolactone; Take 1 tablet (50 mg total) by mouth daily.  Dispense: 90 tablet; Refill: 1 -     Semaglutide (2 MG/DOSE); Inject 2 mg as directed once a week.   Dispense: 9 mL; Refill: 1     I provided 30 minutes of face-to-face time during this encounter reviewing patient's last visit with me, patient's  most recent visit with cardiology,  nephrology,  and neurology,  recent surgical and non surgical procedures, previous  labs and imaging studies, counseling on currently addressed issues,  and post visit ordering to diagnostics and therapeutics .   Follow-up: Return in about 6 months (around 04/27/2024).   Sherlene Shams, MD

## 2023-10-28 NOTE — Patient Instructions (Addendum)
Increase your ozempic dose  to 2 mg  weekly .  If yo udon't tolerate this dose,  the pen  CAN BE MANIPULATED INTO DELIVERING LOWER DOSES BY "COUNTING CLICKS"   IF YOU ARE USING THE 8 MG PEN (GOLD COLOR) THAT DELIVERS THE 2 MG WEEKLY DOSE  10 CLICKS   0.25 MG DOSE 19 CLICK     0.50 MG DOSE 37 CLICKS   1.0 MG DOSE 56 CLICKS   1.5 MG DOSE    Li quer 43  Is a wonderful mediterranean liquor (notes of vanilla and orange)  Simmer  a 32 ounce bottle 100% cranberry juice with juice of 3 oranges and their zest, ALONG WITH  grated ginger root,  until it has reduced by 25% .  Strain once cooled, and add one shot of liquor 43 and one shot of apple brandy. this will keep in the refrigerator for 2-3 weeks and makes EVERY COCKTAIL BETTER.  MAKES A GREAT CHRISTMAS MIMOSA

## 2023-10-29 DIAGNOSIS — E119 Type 2 diabetes mellitus without complications: Secondary | ICD-10-CM | POA: Insufficient documentation

## 2023-10-29 NOTE — Assessment & Plan Note (Signed)
Well controlled on amlodipine, metoprolol and spironolactone; however potassium needs repeating  Lab Results  Component Value Date   NA 138 10/28/2023   K 5.2 No hemolysis seen (H) 10/28/2023   CL 99 10/28/2023   CO2 31 10/28/2023   Lab Results  Component Value Date   CREATININE 0.76 10/28/2023

## 2023-10-29 NOTE — Assessment & Plan Note (Signed)
Patient's weight loss has plateaued on 1 mg ozempic  She has  been advised to increase dose to 2.0 mg.  A1c Is excellent  Lab Results  Component Value Date   HGBA1C 5.9 10/28/2023   Lab Results  Component Value Date   MICROALBUR 1.6 04/13/2023   MICROALBUR <0.7 04/14/2022

## 2023-10-29 NOTE — Assessment & Plan Note (Signed)
Tolerating ozempic  increase dose to 2 mg   No results found for: "LIPASE"

## 2023-10-29 NOTE — Assessment & Plan Note (Signed)
10 year risk Is elevated due to concurrent diabetes diagnosis   She is tolerating  atorvastatin.    Lab Results  Component Value Date   CHOL 149 10/28/2023   HDL 48.10 10/28/2023   LDLCALC 75 10/28/2023   LDLDIRECT 85.0 10/28/2023   TRIG 130.0 10/28/2023   CHOLHDL 3 10/28/2023

## 2023-10-29 NOTE — Assessment & Plan Note (Signed)
Managed with metoprolol. No changes today

## 2023-11-10 ENCOUNTER — Other Ambulatory Visit (INDEPENDENT_AMBULATORY_CARE_PROVIDER_SITE_OTHER): Payer: Medicare Other

## 2023-11-10 DIAGNOSIS — T50905A Adverse effect of unspecified drugs, medicaments and biological substances, initial encounter: Secondary | ICD-10-CM | POA: Diagnosis not present

## 2023-11-10 DIAGNOSIS — E875 Hyperkalemia: Secondary | ICD-10-CM | POA: Diagnosis not present

## 2023-11-10 LAB — BASIC METABOLIC PANEL
BUN: 22 mg/dL (ref 6–23)
CO2: 28 meq/L (ref 19–32)
Calcium: 9.8 mg/dL (ref 8.4–10.5)
Chloride: 102 meq/L (ref 96–112)
Creatinine, Ser: 0.69 mg/dL (ref 0.40–1.20)
GFR: 90.28 mL/min (ref >60.00)
Glucose, Bld: 102 mg/dL — ABNORMAL HIGH (ref 70–99)
Potassium: 4.5 meq/L (ref 3.5–5.1)
Sodium: 141 meq/L (ref 135–145)

## 2023-12-15 ENCOUNTER — Ambulatory Visit (INDEPENDENT_AMBULATORY_CARE_PROVIDER_SITE_OTHER): Payer: Medicare Other | Admitting: Dermatology

## 2023-12-15 DIAGNOSIS — L814 Other melanin hyperpigmentation: Secondary | ICD-10-CM | POA: Diagnosis not present

## 2023-12-15 DIAGNOSIS — W908XXA Exposure to other nonionizing radiation, initial encounter: Secondary | ICD-10-CM

## 2023-12-15 DIAGNOSIS — L821 Other seborrheic keratosis: Secondary | ICD-10-CM

## 2023-12-15 DIAGNOSIS — I781 Nevus, non-neoplastic: Secondary | ICD-10-CM

## 2023-12-15 DIAGNOSIS — L570 Actinic keratosis: Secondary | ICD-10-CM

## 2023-12-15 DIAGNOSIS — L578 Other skin changes due to chronic exposure to nonionizing radiation: Secondary | ICD-10-CM | POA: Diagnosis not present

## 2023-12-15 DIAGNOSIS — Z85828 Personal history of other malignant neoplasm of skin: Secondary | ICD-10-CM

## 2023-12-15 DIAGNOSIS — Z85820 Personal history of malignant melanoma of skin: Secondary | ICD-10-CM

## 2023-12-15 DIAGNOSIS — Z1283 Encounter for screening for malignant neoplasm of skin: Secondary | ICD-10-CM | POA: Diagnosis not present

## 2023-12-15 DIAGNOSIS — D229 Melanocytic nevi, unspecified: Secondary | ICD-10-CM

## 2023-12-15 DIAGNOSIS — I8393 Asymptomatic varicose veins of bilateral lower extremities: Secondary | ICD-10-CM

## 2023-12-15 DIAGNOSIS — D1801 Hemangioma of skin and subcutaneous tissue: Secondary | ICD-10-CM

## 2023-12-15 DIAGNOSIS — L219 Seborrheic dermatitis, unspecified: Secondary | ICD-10-CM

## 2023-12-15 NOTE — Progress Notes (Signed)
 Follow-Up Visit   Subjective  Amy Petty is a 68 y.o. female who presents for the following: Skin Cancer Screening and Full Body Skin Exam  The patient presents for Total-Body Skin Exam (TBSE) for skin cancer screening and mole check. The patient has spots, moles and lesions to be evaluated, some may be new or changing. History of melanoma 0.46mm 6/24, BCC, and AKs. She has a dry spot that comes and goes on her nose.    The following portions of the chart were reviewed this encounter and updated as appropriate: medications, allergies, medical history  Review of Systems:  No other skin or systemic complaints except as noted in HPI or Assessment and Plan.  Objective  Well appearing patient in no apparent distress; mood and affect are within normal limits.  A full examination was performed including scalp, head, eyes, ears, nose, lips, neck, chest, axillae, abdomen, back, buttocks, bilateral upper extremities, bilateral lower extremities, hands, feet, fingers, toes, fingernails, and toenails. All findings within normal limits unless otherwise noted below.   Relevant physical exam findings are noted in the Assessment and Plan.  L nasal dorsum x 1, R ant ala x 1, glabella x 1, R upper sternum x 1, scalp x 3 (7) Pink scaly macules.  Assessment & Plan   SKIN CANCER SCREENING PERFORMED TODAY.  ACTINIC DAMAGE - Chronic condition, secondary to cumulative UV/sun exposure - diffuse scaly erythematous macules with underlying dyspigmentation - Recommend daily broad spectrum sunscreen SPF 30+ to sun-exposed areas, reapply every 2 hours as needed.  - Staying in the shade or wearing long sleeves, sun glasses (UVA+UVB protection) and wide brim hats (4-inch brim around the entire circumference of the hat) are also recommended for sun protection.  - Call for new or changing lesions.  LENTIGINES, SEBORRHEIC KERATOSES, HEMANGIOMAS - Benign normal skin lesions - Benign-appearing - Call for any  changes  MELANOCYTIC NEVI - Tan-brown and/or pink-flesh-colored symmetric macules and papules - Benign appearing on exam today - Observation - Call clinic for new or changing moles - Recommend daily use of broad spectrum spf 30+ sunscreen to sun-exposed areas.   HISTORY OF MELANOMA Right spinal mid upper back, Level II Breslow's 0.5 mm, WLE 04/27/23, CASTLE 1A - No evidence of recurrence today - No lymphadenopathy - Recommend regular full body skin exams - Recommend daily broad spectrum sunscreen SPF 30+ to sun-exposed areas, reapply every 2 hours as needed.  - Call if any new or changing lesions are noted between office visits    HISTORY OF BASAL CELL CARCINOMA OF THE SKIN L ant nasal ala Mohs 01/21/23 L nasal tip Mohs 2018 - No evidence of recurrence today - Recommend regular full body skin exams - Recommend daily broad spectrum sunscreen SPF 30+ to sun-exposed areas, reapply every 2 hours as needed.  - Call if any new or changing lesions are noted between office visits  SEBORRHEIC DERMATITIS Exam: Pink patches with mild scale at scalp  Chronic and persistent condition with duration or expected duration over one year. Condition is improving with treatment but not currently at goal.   Seborrheic Dermatitis is a chronic persistent rash characterized by pinkness and scaling most commonly of the mid face but also can occur on the scalp (dandruff), ears; mid chest, mid back and groin.  It tends to be exacerbated by stress and cooler weather.  People who have neurologic disease may experience new onset or exacerbation of existing seborrheic dermatitis.  The condition is not curable but treatable  and can be controlled.  Treatment Plan: Continue mometasone  lotion 1-2 times daily to scalp as needed for itch. Avoid applying to face, groin, and axilla. Use as directed. Long-term use can cause thinning of the skin. Continue ketoconazole 2% shampoo apply three times per week, massage into scalp  and leave in for 5-10 minutes before rinsing out.  Topical steroids (such as triamcinolone , fluocinolone, fluocinonide, mometasone , clobetasol, halobetasol, betamethasone, hydrocortisone) can cause thinning and lightening of the skin if they are used for too long in the same area. Your physician has selected the right strength medicine for your problem and area affected on the body. Please use your medication only as directed by your physician to prevent side effects.   Varicose Veins/Spider Veins - Dilated blue, purple or red veins at the lower extremities - Reassured - Smaller vessels can be treated by sclerotherapy (a procedure to inject a medicine into the veins to make them disappear) if desired, but the treatment is not covered by insurance. Larger vessels may be covered if symptomatic and we would refer to vascular surgeon if treatment desired.   AK (ACTINIC KERATOSIS) (7) L nasal dorsum x 1, R ant ala x 1, glabella x 1, R upper sternum x 1, scalp x 3 (7) Actinic keratoses are precancerous spots that appear secondary to cumulative UV radiation exposure/sun exposure over time. They are chronic with expected duration over 1 year. A portion of actinic keratoses will progress to squamous cell carcinoma of the skin. It is not possible to reliably predict which spots will progress to skin cancer and so treatment is recommended to prevent development of skin cancer.  Recommend daily broad spectrum sunscreen SPF 30+ to sun-exposed areas, reapply every 2 hours as needed.  Recommend staying in the shade or wearing long sleeves, sun glasses (UVA+UVB protection) and wide brim hats (4-inch brim around the entire circumference of the hat). Call for new or changing lesions. Destruction of lesion - L nasal dorsum x 1, R ant ala x 1, glabella x 1, R upper sternum x 1, scalp x 3 (7)  Destruction method: cryotherapy   Informed consent: discussed and consent obtained   Lesion destroyed using liquid nitrogen:  Yes   Region frozen until ice ball extended beyond lesion: Yes   Outcome: patient tolerated procedure well with no complications   Post-procedure details: wound care instructions given   Additional details:  Prior to procedure, discussed risks of blister formation, small wound, skin dyspigmentation, or rare scar following cryotherapy. Recommend Vaseline ointment to treated areas while healing.  Return in about 3 months (around 03/13/2024) for TBSE, Hx melanoma, Hx BCC, Hx AKs.  IAndrea Kerns, CMA, am acting as scribe for Rexene Rattler, MD .   Documentation: I have reviewed the above documentation for accuracy and completeness, and I agree with the above.  Rexene Rattler, MD

## 2023-12-15 NOTE — Patient Instructions (Addendum)

## 2024-01-03 ENCOUNTER — Other Ambulatory Visit: Payer: Self-pay | Admitting: Internal Medicine

## 2024-01-05 ENCOUNTER — Encounter: Payer: Self-pay | Admitting: Family Medicine

## 2024-01-05 ENCOUNTER — Ambulatory Visit (INDEPENDENT_AMBULATORY_CARE_PROVIDER_SITE_OTHER): Payer: Medicare Other | Admitting: Family Medicine

## 2024-01-05 ENCOUNTER — Ambulatory Visit: Payer: Self-pay | Admitting: Internal Medicine

## 2024-01-05 VITALS — BP 122/84 | HR 85 | Temp 98.3°F | Wt 259.6 lb

## 2024-01-05 DIAGNOSIS — R059 Cough, unspecified: Secondary | ICD-10-CM

## 2024-01-05 DIAGNOSIS — J069 Acute upper respiratory infection, unspecified: Secondary | ICD-10-CM | POA: Diagnosis not present

## 2024-01-05 LAB — POCT INFLUENZA A/B
Influenza A, POC: NEGATIVE
Influenza B, POC: NEGATIVE

## 2024-01-05 LAB — POC COVID19 BINAXNOW: SARS Coronavirus 2 Ag: NEGATIVE

## 2024-01-05 MED ORDER — GUAIFENESIN-CODEINE 100-10 MG/5ML PO SOLN: 10.0000 mL | Freq: Three times a day (TID) | ORAL | 0 refills | Status: DC | PRN

## 2024-01-05 NOTE — Telephone Encounter (Signed)
 FYI- patient seeing Dr Birdie Sons

## 2024-01-05 NOTE — Telephone Encounter (Signed)
 Copied from CRM [id number]. Topic: Clinical - Red Word Triage >> Jan 05, 2024  8:11 AM Gurney Maxin H wrote: [facility] that prompted transfer to Nurse Triage: Pressure in ears, voice going in and out, sore throat, cough   Chief Complaint: Cold Symptoms/Sore Throat Symptoms: Sore throat, bilateral ear aches, dry cough, hoarse voice Frequency: Started 4 days ago but worse in the past 2 days Pertinent Negatives: Patient denies difficulty breathing, rashes, fever, nausea, vomiting, diarrhea Disposition: [] ED /[] Urgent Care (no appt availability in office) / [x] Appointment(In office/virtual)/ []  [facility]/ [] Home Care/ [] Refused Recommended Disposition /[] [facility]/ []  Follow-up with PCP Additional Notes: Patient called and advised that she started feeling bad 4 days ago and it got worse in the past 2 days. She endorses a sore throat with a hoarse voice, nasal drainage, crusty eyes, pressure/pain in both ears, and having a dry cough. She denies being around anyone sick that she is aware of. Patient denies difficulty breathing, rashes, fever, nausea, vomiting, diarrhea. Appointment made for today 01/05/2024 at 11:00 am with Dr Marikay Alar. Care Advice given as per protocol and patient is advised that if anything worsens to go to the emergency room.  Patient verbalized understanding.   Reason for Disposition  Earache also present  Answer Assessment - Initial Assessment Questions 1. ONSET: "When did the throat start hurting?" (Hours or days ago)      Scratchy 4 days ago and got worse 2. SEVERITY: "How bad is the sore throat?" (Scale 1-10; mild, moderate or severe)   - MILD (1-3):  Doesn't interfere with eating or normal activities.   - MODERATE (4-7): Interferes with eating some solids and normal activities.   - SEVERE (8-10):  Excruciating pain, interferes with most normal activities.   - SEVERE WITH DYSPHAGIA (10): Can't swallow liquids, drooling.     5-6 3. STREP  EXPOSURE: "Has there been any exposure to strep within the past week?" If Yes, ask: "What type of contact occurred?"      No 4.  VIRAL SYMPTOMS: "Are there any symptoms of a cold, such as a runny nose, cough, hoarse voice or red eyes?"      Nasal drainage, dry cough, pressure in ears 5. FEVER: "Do you have a fever?" If Yes, ask: "What is your temperature, how was it measured, and when did it start?"     No 6. PUS ON THE TONSILS: "Is there pus on the tonsils in the back of your throat?"     "I havent looked" 7. OTHER SYMPTOMS: "Do you have any other symptoms?" (e.g., difficulty breathing, headache, rash)     Hoarse voice  Protocols used: Sore Throat-A-AH

## 2024-01-05 NOTE — Progress Notes (Unsigned)
 Marikay Alar, MD Phone: [phone]  Amy Petty is a 68 y.o. female who presents today for same-day visit.  Cough: Patient notes onset of symptoms Friday with scratchy throat.  Got worse on Sunday.  Gets congested in her face when laying down.  Some postnasal drip and sore throat.  Notes ear pressure and pain.  No body aches.  No fever.  No shortness of breath.  No taste or smell disturbances.  No known COVID or flu exposures.  She is been taking Tylenol Cold and flu and notes she took some Biaxin yesterday that she had on hand though this did not help.  Social History   Tobacco Use  Smoking Status Never  Smokeless Tobacco Never    Current Outpatient Medications on File Prior to Visit  Medication Sig Dispense Refill   acetaminophen (TYLENOL) 650 MG CR tablet Take 650 mg by mouth 2 (two) times daily.     albuterol (PROAIR HFA) 108 (90 Base) MCG/ACT inhaler Inhale 2 puffs into the lungs every 6 (six) hours as needed for wheezing or shortness of breath. 1 each 0   amLODipine (NORVASC) 2.5 MG tablet TAKE ONE TABLET BY MOUTH ONE TIME DAILY 90 tablet 1   Ascorbic Acid (VITAMIN C) 1000 MG tablet Take 1,000 mg by mouth daily.     atorvastatin (LIPITOR) 10 MG tablet Take 1 tablet (10 mg total) by mouth daily. 90 tablet 3   cholecalciferol (VITAMIN D) 1000 units tablet Take 1,000 Units by mouth daily.     cyanocobalamin (VITAMIN B12) 1000 MCG tablet Take 1,000 mcg by mouth daily.     ELDERBERRY PO Take by mouth at bedtime.     ketoconazole (NIZORAL) 2 % shampoo WASH SCALP 3-4 TIMES PER WEEK. LET SIT SEVERAL MINUTES BEFORE RINSING  3   Melatonin 2.5 MG CHEW Chew 1 tablet by mouth at bedtime.     metoprolol succinate (TOPROL-XL) 100 MG 24 hr tablet TAKE ONE TABLET BY MOUTH ONE TIME DAILY WITH OR IMMEDIATELY FOLLOWING A MEAL 90 tablet 1   mometasone (ELOCON) 0.1 % lotion Apply 1-2 times daily to affected areas at scalp as needed for itch. Avoid applying to face, groin, and axilla. Use as  directed. Long-term use can cause thinning of the skin. 60 mL 2   montelukast (SINGULAIR) 10 MG tablet TAKE ONE TABLET BY MOUTH AT BEDTIME 90 tablet 1   Multiple Vitamin (MULTIVITAMIN) tablet Take 1 tablet by mouth daily.       Multiple Vitamins-Minerals (HAIR SKIN & NAILS PO) Take by mouth daily.     naproxen sodium (ALEVE) 220 MG tablet Take 220 mg by mouth 2 (two) times daily.     omeprazole (PRILOSEC) 40 MG capsule TAKE ONE CAPSULE BY MOUTH ONE TIME DAILY 90 capsule 2   Semaglutide, 2 MG/DOSE, 8 MG/3ML SOPN Inject 2 mg as directed once a week. 9 mL 1   spironolactone (ALDACTONE) 50 MG tablet TAKE ONE TABLET BY MOUTH ONE TIME DAILY 90 tablet 1   triamcinolone (NASACORT ALLERGY 24HR) 55 MCG/ACT AERO nasal inhaler Place 2 sprays into the nose at bedtime.     metroNIDAZOLE (METROCREAM) 0.75 % cream Apply to face once to twice daily for rosacea. (Patient not taking: Reported on 01/05/2024) 45 g 5   No current facility-administered medications on file prior to visit.     ROS see history of present illness  Objective  Physical Exam Vitals:   01/05/24 1103  BP: 122/84  Pulse: 85  Temp: 98.3  F (36.8 C)  SpO2: 95%    BP Readings from Last 3 Encounters:  01/05/24 122/84  10/28/23 114/72  07/29/23 128/80   Wt Readings from Last 3 Encounters:  01/05/24 259 lb 9.6 oz (117.8 kg)  10/28/23 263 lb 6.4 oz (119.5 kg)  08/19/23 260 lb (117.9 kg)    Physical Exam Constitutional:      General: She is not in acute distress.    Appearance: She is not diaphoretic.  HENT:     Right Ear: Tympanic membrane normal.     Left Ear: Tympanic membrane normal.     Mouth/Throat:     Mouth: Mucous membranes are moist.     Pharynx: Oropharynx is clear.  Cardiovascular:     Rate and Rhythm: Normal rate and regular rhythm.     Heart sounds: Normal heart sounds.  Pulmonary:     Effort: Pulmonary effort is normal.     Breath sounds: Normal breath sounds.  Skin:    General: Skin is warm and dry.   Neurological:     Mental Status: She is alert.      Assessment/Plan: Please see individual problem list.  Viral upper respiratory illness Assessment & Plan: Suspect viral upper respiratory illness.  COVID and flu testing done today.  Both negative.  Treating supportively with Mucinex, Flonase over-the-counter, and Claritin.  If not improving by the end of the week she will let us know.  Orders: -     guaiFENesin-Codeine; Take 10 mLs by mouth every 8 (eight) hours as needed for cough.  Dispense: 120 mL; Refill: 0  Cough, unspecified type -     POCT Influenza A/B -     POC COVID-19 BinaxNow    No follow-ups on file.   Marikay Alar, MD [facility]

## 2024-01-05 NOTE — Patient Instructions (Signed)
 Nice to see you. You can continue nasonex. Please add claritin over the counter.  You can try plain Mucinex over-the-counter as well. You can use the codeine cough syrup as well.  If this makes you excessively drowsy please discontinue use and let us know.

## 2024-01-05 NOTE — Assessment & Plan Note (Signed)
 Suspect viral upper respiratory illness.  COVID and flu testing done today.  Both negative.  Treating supportively with Mucinex, Flonase over-the-counter, and Claritin.  If not improving by the end of the week she will let us know.

## 2024-01-12 ENCOUNTER — Other Ambulatory Visit: Payer: Self-pay | Admitting: Internal Medicine

## 2024-01-12 ENCOUNTER — Telehealth: Payer: Self-pay

## 2024-01-12 MED ORDER — PROMETHAZINE-DM 6.25-15 MG/5ML PO SYRP: 5.0000 mL | ORAL_SOLUTION | Freq: Four times a day (QID) | ORAL | 0 refills | Status: DC | PRN

## 2024-01-12 NOTE — Telephone Encounter (Signed)
 Patient is aware that the medication has been sent in.

## 2024-01-12 NOTE — Telephone Encounter (Signed)
 Copied from CRM [id number]. Topic: Clinical - Medication Question >> Jan 12, 2024  2:34 PM [place] E wrote: Reason for CRM: Maralyn Sago form [facility] called stating that they are out of Robitussin for the patient. Sarah provided agent with different alternatives that she has available and asking if Dr. Darrick Huntsman can sign off on either of these for the patient:   Promethazine DM Tussionex Hydromet

## 2024-04-05 ENCOUNTER — Ambulatory Visit: Payer: Medicare Other | Admitting: Dermatology

## 2024-04-11 ENCOUNTER — Ambulatory Visit: Payer: Self-pay

## 2024-04-11 ENCOUNTER — Ambulatory Visit (INDEPENDENT_AMBULATORY_CARE_PROVIDER_SITE_OTHER)

## 2024-04-11 VITALS — BP 104/74 | HR 88 | Temp 97.7°F | Ht 69.0 in | Wt 258.6 lb

## 2024-04-11 DIAGNOSIS — R051 Acute cough: Secondary | ICD-10-CM

## 2024-04-11 DIAGNOSIS — J069 Acute upper respiratory infection, unspecified: Secondary | ICD-10-CM | POA: Insufficient documentation

## 2024-04-11 MED ORDER — CLARITHROMYCIN 250 MG PO TABS: 500.0000 mg | ORAL_TABLET | Freq: Every day | ORAL | 0 refills | Status: AC

## 2024-04-11 MED ORDER — GUAIFENESIN-CODEINE 100-10 MG/5ML PO SOLN: 10.0000 mL | Freq: Three times a day (TID) | ORAL | 0 refills | Status: AC | PRN

## 2024-04-11 MED ORDER — PREDNISONE 20 MG PO TABS: 20.0000 mg | ORAL_TABLET | Freq: Every day | ORAL | 0 refills | Status: AC

## 2024-04-11 MED ORDER — MUPIROCIN 2 % EX OINT: 1.0000 | TOPICAL_OINTMENT | Freq: Two times a day (BID) | CUTANEOUS | 0 refills | Status: AC

## 2024-04-11 NOTE — Telephone Encounter (Signed)
 Scheduled pt to see DR. Bair today 2 pm

## 2024-04-11 NOTE — Patient Instructions (Signed)
-   Recommend we start you on clarithromycin 500 mg once a day for 10 days.  We don't usually use this antibiotic commonly for bronchitis. I recommend taking probiotic 2 hr before or after taking the antibiotic.  - We will continue with 5 more days of Prednisone , take 20 mg once a day.  - You can try mupirocin  ointment 2 % twice a day as needed to help with nose irritation.  - guaifenesin -codeine  100-10 Mg/5Ml, you can take 10 ml every 8 hourly as needed for cough.  Please do not take any other OTC or prescribed cough medication when you are taking this medication.

## 2024-04-11 NOTE — Telephone Encounter (Signed)
 Copied From CRM [id number]. Reason for Triage: Patient went to urgent care and was diagnosed with bronchitis because no appts were available.  She was given antibiotics, but has a nasty cough.  Needs to know what to do or if something can be called in.  She will not available for the next 45 minutes but call anytime after that. Please return her call at [phone].  Chief Complaint: Cough Symptoms: Wheezing, nasal congestion Frequency: 5-6 days Pertinent Negatives: Patient denies fever Disposition: [] ED /[] Urgent Care (no appt availability in office) / [x] Appointment(In office/virtual)/ []  [facility]/ [] Home Care/ [] Refused Recommended Disposition /[] [facility]/ []  Follow-up with PCP Additional Notes: Patient called in because she is experiencing a productive cough and wheezing. Patient stated symptoms started 5-6 days ago. Patient stated she went to an UC because no appointments were available. Patient stated she has completed taking erythromycin and prednisone . Patient stated her symptoms have not improved. Patient denied SOB outside of coughing attacks. Patient able to speak in clear and complete sentences while on phone with this RN. Advised patient to be seen within 4 hours, per protocol. No availability in office. Patient does not want to go to UC again and would like to be seen in office. Provided care advice and instructed patient to call back if symptoms worsen. Patient complied. Please contact patient to schedule.  Reason for Disposition  Wheezing is present  Answer Assessment - Initial Assessment Questions 1. ONSET: "When did the cough begin?"      5-6 days ago 2. SEVERITY: "How bad is the cough today?"      Coughing spells 3. SPUTUM: "Describe the color of your sputum" (none, dry cough; clear, white, yellow, green)     Creamy, yellow phlegm 4. HEMOPTYSIS: "Are you coughing up any blood?" If so ask: "How much?" (flecks, streaks, tablespoons, etc.)      Denies 5. DIFFICULTY BREATHING: "Are you having difficulty breathing?" If Yes, ask: "How bad is it?" (e.g., mild, moderate, severe)    - MILD: No SOB at rest, mild SOB with walking, speaks normally in sentences, can lie down, no retractions, pulse < 100.    - MODERATE: SOB at rest, SOB with minimal exertion and prefers to sit, cannot lie down flat, speaks in phrases, mild retractions, audible wheezing, pulse 100-120.    - SEVERE: Very SOB at rest, speaks in single words, struggling to breathe, sitting hunched forward, retractions, pulse > 120      "Only during coughing attacks", denies outside of coughing 6. FEVER: "Do you have a fever?" If Yes, ask: "What is your temperature, how was it measured, and when did it start?"     Denies 7. CARDIAC HISTORY: "Do you have any history of heart disease?" (e.g., heart attack, congestive heart failure)      Heart ablation 8. LUNG HISTORY: "Do you have any history of lung disease?"  (e.g., pulmonary embolus, asthma, emphysema)     Denies 10. OTHER SYMPTOMS: "Do you have any other symptoms?" (e.g., runny nose, wheezing, chest pain)       "Snap, crackle and pop" in lungs when laying flat, blood in nasal congestion, wheezing  Protocols used: Cough - Acute Productive-A-AH

## 2024-04-11 NOTE — Progress Notes (Signed)
 Acute Office Visit  Subjective:     Patient ID: Amy Petty, female    DOB: 02-28-1956, 68 y.o.   MRN: [id number]  Chief Complaint  Patient presents with   Cough    Patient was seen in Urgent Care a week ago and diagnosed with Bronchitis. Patient says she was treated with antibiotics and cough syrup, but nothing is helping her. Patient states she is tired of coughing and it disturbing her sleep at night and peeing her pants and having to change her clothes.     Patient is in today for cough:  Cough for one week. Was seen at [facility] on 5/27 and was treated with Azithromycin for 5 days, Prednisone  20 mg BID for 5 days, Albuterol  and Promethazine -dextromethorphan.  She has been using Albuterol  inhaler twice a day. Which worsened her cough. Last use was last night. She has been taking 5 ml of Promethazine -dextromethorphan one to twice a day. Does not seem to improve. Patient reports she usually sees ENT specialist Dr Juengel at least once a year and and treats her with Biaxin when she has bronchitis like symptoms.   She is also reporting of nose irritation from cough and postnasal drip. Uses prn mupirocin  ointment and is requesting refill.   Patient reports cough has slightly improved but continues to wake her up at night, a/w urinary incontinence from coughing. Describes cough to be non-productive, constant in nature. She state of rib pain from coughing. She is also feeling b/l ear fullness, wheezing. No SOB, fever, myalgias, SOB, sore throat. Cough is worse with laying down.      Objective:    BP 104/74   Pulse 88   Temp 97.7 F (36.5 C) (Oral)   Ht 5\' 9"  (1.753 m)   Wt 258 lb 9.6 oz (117.3 kg)   LMP 11/23/2013   SpO2 95%   BMI 38.19 kg/m     Physical Exam Constitutional:      General: She is not in acute distress. HENT:     Head: Normocephalic and atraumatic.     Right Ear: Tympanic membrane is bulging. Tympanic membrane is not erythematous.     Left Ear: Tympanic  membrane is bulging. Tympanic membrane is not erythematous.     Nose:     Right Sinus: No maxillary sinus tenderness or frontal sinus tenderness.     Left Sinus: No maxillary sinus tenderness or frontal sinus tenderness.     Comments: Nasal mucosa erythematous. Cardiovascular:     Rate and Rhythm: Normal rate.  Pulmonary:     Effort: No tachypnea or accessory muscle usage.     Breath sounds: Examination of the right-lower field reveals wheezing. Examination of the left-lower field reveals wheezing. Wheezing present. No rales.     Comments: Inspiration triggers cough Abdominal:     General: There is no distension.     Palpations: Abdomen is soft.     Tenderness: There is no guarding.  Musculoskeletal:     Cervical back: No rigidity.     Right lower leg: No edema.     Left lower leg: No edema.  Skin:    General: Skin is warm.  Neurological:     Mental Status: She is alert and oriented to person, place, and time.  Psychiatric:        Mood and Affect: Mood normal.     No results found for any visits on 04/11/24.     Assessment & Plan:  Acute cough Assessment &  Plan: Obtain chest x-ray to r/o pneumonia.  Bactroban  2%, BID prn for nasal irritation.   Guaifenesin -codeine  100-10 mg/ml, every 8 hourly prn for cough. Please don't take other cough medications that was prescribed to you in the past when on this medication. PDMP reviewed. Counseled patient Azithromycin and Clarithromycin are same class of medication. We don't usually use clarithromycin for bronchitis on a regular basis. Discuss antibiotic s/e including c. Dif diarrhea, vaginal yeast infection. Recommend take daily probiotic 2 hr before or after antibiotic. Will treat her with Biaxin 500 mg once a day for 10 days for potential atypical bacterial pneumonia.  5 more days of Prednisone  20 mg once a day to help with inflammation.  F/U with PCP as scheduled    Orders: -     DG Chest 2 View -     Mupirocin ; Apply 1  Application topically 2 (two) times daily. To nostril  Dispense: 22 g; Refill: 0 -     guaiFENesin -Codeine ; Take 10 mLs by mouth every 8 (eight) hours as needed for cough.  Dispense: 120 mL; Refill: 0 -     Clarithromycin; Take 2 tablets (500 mg total) by mouth daily for 10 days.  Dispense: 20 tablet; Refill: 0 -     predniSONE ; Take 1 tablet (20 mg total) by mouth daily with breakfast for 5 days.  Dispense: 5 tablet; Refill: 0   I spent 35 minutes on the day of this face-to-face encounter reviewing the patient's medical history, UC note from [facility] from 04/05/24, current medications, and reviewing the assessment and plan with the patient. Additionally, I spent time post-visit ordering and reviewing diagnostics and therapeutics with the patient.  Return if symptoms worsen or fail to improve, with PCP.  Jacklin Mascot, MD

## 2024-04-11 NOTE — Assessment & Plan Note (Addendum)
 Obtain chest x-ray to r/o pneumonia.  Bactroban  2%, BID prn for nasal irritation.   Guaifenesin -codeine  100-10 mg/ml, every 8 hourly prn for cough. Please don't take other cough medications that was prescribed to you in the past when on this medication. PDMP reviewed. Counseled patient Azithromycin and Clarithromycin are same class of medication. We don't usually use clarithromycin for bronchitis on a regular basis. Discuss antibiotic s/e including c. Dif diarrhea, vaginal yeast infection. Recommend take daily probiotic 2 hr before or after antibiotic. Will treat her with Biaxin 500 mg once a day for 10 days for potential atypical bacterial pneumonia.  5 more days of Prednisone  20 mg once a day to help with inflammation.  F/U with PCP as scheduled

## 2024-04-12 ENCOUNTER — Other Ambulatory Visit: Payer: Self-pay | Admitting: Internal Medicine

## 2024-04-19 ENCOUNTER — Other Ambulatory Visit: Payer: Self-pay | Admitting: Internal Medicine

## 2024-04-27 ENCOUNTER — Ambulatory Visit: Attending: Internal Medicine | Admitting: Internal Medicine

## 2024-04-27 ENCOUNTER — Encounter: Payer: Self-pay | Admitting: Internal Medicine

## 2024-04-27 ENCOUNTER — Ambulatory Visit: Payer: Medicare Other | Admitting: Internal Medicine

## 2024-04-27 ENCOUNTER — Ambulatory Visit: Payer: Self-pay | Admitting: Internal Medicine

## 2024-04-27 VITALS — BP 104/70 | HR 92 | Ht 69.0 in | Wt 255.6 lb

## 2024-04-27 VITALS — BP 110/60 | HR 88 | Ht 69.0 in | Wt 257.6 lb

## 2024-04-27 DIAGNOSIS — I10 Essential (primary) hypertension: Secondary | ICD-10-CM | POA: Insufficient documentation

## 2024-04-27 DIAGNOSIS — E1169 Type 2 diabetes mellitus with other specified complication: Secondary | ICD-10-CM

## 2024-04-27 DIAGNOSIS — C4359 Malignant melanoma of other part of trunk: Secondary | ICD-10-CM | POA: Insufficient documentation

## 2024-04-27 DIAGNOSIS — E119 Type 2 diabetes mellitus without complications: Secondary | ICD-10-CM

## 2024-04-27 DIAGNOSIS — I471 Supraventricular tachycardia, unspecified: Secondary | ICD-10-CM | POA: Insufficient documentation

## 2024-04-27 DIAGNOSIS — E1159 Type 2 diabetes mellitus with other circulatory complications: Secondary | ICD-10-CM | POA: Diagnosis not present

## 2024-04-27 DIAGNOSIS — Z9889 Other specified postprocedural states: Secondary | ICD-10-CM | POA: Insufficient documentation

## 2024-04-27 DIAGNOSIS — I152 Hypertension secondary to endocrine disorders: Secondary | ICD-10-CM | POA: Diagnosis not present

## 2024-04-27 DIAGNOSIS — E785 Hyperlipidemia, unspecified: Secondary | ICD-10-CM

## 2024-04-27 DIAGNOSIS — M5442 Lumbago with sciatica, left side: Secondary | ICD-10-CM

## 2024-04-27 DIAGNOSIS — R600 Localized edema: Secondary | ICD-10-CM

## 2024-04-27 DIAGNOSIS — Z1231 Encounter for screening mammogram for malignant neoplasm of breast: Secondary | ICD-10-CM

## 2024-04-27 DIAGNOSIS — E669 Obesity, unspecified: Secondary | ICD-10-CM | POA: Diagnosis not present

## 2024-04-27 DIAGNOSIS — Z7985 Long-term (current) use of injectable non-insulin antidiabetic drugs: Secondary | ICD-10-CM

## 2024-04-27 DIAGNOSIS — R051 Acute cough: Secondary | ICD-10-CM

## 2024-04-27 DIAGNOSIS — M5441 Lumbago with sciatica, right side: Secondary | ICD-10-CM

## 2024-04-27 DIAGNOSIS — G8929 Other chronic pain: Secondary | ICD-10-CM

## 2024-04-27 LAB — LIPID PANEL
Cholesterol: 158 mg/dL (ref 0–200)
HDL: 50.6 mg/dL (ref >39.00)
LDL Cholesterol: 84 mg/dL (ref 0–99)
NonHDL: 107.17
Total CHOL/HDL Ratio: 3
Triglycerides: 118 mg/dL (ref 0.0–149.0)
VLDL: 23.6 mg/dL (ref 0.0–40.0)

## 2024-04-27 LAB — COMPREHENSIVE METABOLIC PANEL WITH GFR
ALT: 20 U/L (ref 0–35)
AST: 19 U/L (ref 0–37)
Albumin: 4.4 g/dL (ref 3.5–5.2)
Alkaline Phosphatase: 88 U/L (ref 39–117)
BUN: 19 mg/dL (ref 6–23)
CO2: 34 meq/L — ABNORMAL HIGH (ref 19–32)
Calcium: 10 mg/dL (ref 8.4–10.5)
Chloride: 100 meq/L (ref 96–112)
Creatinine, Ser: 0.79 mg/dL (ref 0.40–1.20)
GFR: 77.56 mL/min (ref >60.00)
Glucose, Bld: 94 mg/dL (ref 70–99)
Potassium: 4.6 meq/L (ref 3.5–5.1)
Sodium: 139 meq/L (ref 135–145)
Total Bilirubin: 0.7 mg/dL (ref 0.2–1.2)
Total Protein: 7.6 g/dL (ref 6.0–8.3)

## 2024-04-27 LAB — MICROALBUMIN / CREATININE URINE RATIO
Creatinine,U: 368.8 mg/dL
Microalb Creat Ratio: 6.1 mg/g (ref 0.0–30.0)
Microalb, Ur: 2.2 mg/dL — ABNORMAL HIGH (ref 0.0–1.9)

## 2024-04-27 LAB — LDL CHOLESTEROL, DIRECT: Direct LDL: 90 mg/dL

## 2024-04-27 LAB — HEMOGLOBIN A1C: Hgb A1c MFr Bld: 6 % (ref 4.6–6.5)

## 2024-04-27 MED ORDER — ATORVASTATIN CALCIUM 20 MG PO TABS: 20.0000 mg | ORAL_TABLET | Freq: Every day | ORAL | 1 refills | Status: DC

## 2024-04-27 MED ORDER — MONTELUKAST SODIUM 10 MG PO TABS: ORAL_TABLET | ORAL | 1 refills | Status: DC

## 2024-04-27 MED ORDER — BENZONATATE 200 MG PO CAPS: 200.0000 mg | ORAL_CAPSULE | Freq: Three times a day (TID) | ORAL | 1 refills | Status: DC | PRN

## 2024-04-27 MED ORDER — AMLODIPINE BESYLATE 2.5 MG PO TABS: 2.5000 mg | ORAL_TABLET | Freq: Every day | ORAL | 1 refills | Status: DC

## 2024-04-27 MED ORDER — SEMAGLUTIDE (2 MG/DOSE) 8 MG/3ML ~~LOC~~ SOPN: 2.0000 mg | PEN_INJECTOR | SUBCUTANEOUS | 1 refills | Status: DC

## 2024-04-27 MED ORDER — SPIRONOLACTONE 50 MG PO TABS: 50.0000 mg | ORAL_TABLET | Freq: Every day | ORAL | 1 refills | Status: DC

## 2024-04-27 MED ORDER — TELMISARTAN 40 MG PO TABS: 40.0000 mg | ORAL_TABLET | Freq: Every day | ORAL | 1 refills | Status: DC

## 2024-04-27 NOTE — Progress Notes (Signed)
 Subjective:  Patient ID: Amy Petty, female    DOB: 03-Aug-1956  Age: 68 y.o. MRN: [id number]  CC: The primary encounter diagnosis was Encounter for screening mammogram for malignant neoplasm of breast. Diagnoses of Obesity, diabetes, and hypertension syndrome (HCC), Hyperlipidemia LDL goal <100, Acute cough, Diabetes mellitus treated with injections of non-insulin medication (HCC), Primary hypertension, Malignant melanoma of upper back (HCC), Chronic right-sided low back pain with bilateral sciatica, and Hyperlipidemia LDL goal <70 were also pertinent to this visit.   HPI LAKYN ALSTEEN presents for  Chief Complaint  Patient presents with   Medical Management of Chronic Issues    6 month follow up    Cc: cough   treated initially for cough without sinus symptoms with azithromycin  and prednisone  by [facility] 3 weeks ago , no improvement one week,  saw [facility] ,  took Biaxin  and a  2nd round of abx and prednisone   ronchitis  chest x ray was clear .  Took probiotics.  No diarrhea   still having mucoid phlegm.  Mupirocin  ointment for nasal scaps.     2) obesity diabetes and hypertension :  currently taking 0zempic 2 mg weekly,  down 32 lbs since Feb 2024.    Hypertension: patient checks blood pressure twice weekly at home.  Readings have been for the most part <130/80 at rest . Patient is following a reduced salt diet most days and is taking medications as prescribed   3)  ESI lumbar spine 3 weeks ago by [facility].  Riht foot remains numb  but he has not foot drop   Outpatient Medications Prior to Visit  Medication Sig Dispense Refill   acetaminophen  (TYLENOL ) 650 MG CR tablet Take 650 mg by mouth 2 (two) times daily.     albuterol  (PROAIR  HFA) 108 (90 Base) MCG/ACT inhaler Inhale 2 puffs into the lungs every 6 (six) hours as needed for wheezing or shortness of breath. 1 each 0   Ascorbic Acid (VITAMIN C) 1000 MG tablet Take 1,000 mg by mouth daily.     cholecalciferol (VITAMIN D ) 1000 units tablet  Take 1,000 Units by mouth daily.     cyanocobalamin  (VITAMIN B12) 1000 MCG tablet Take 1,000 mcg by mouth daily.     ELDERBERRY PO Take by mouth at bedtime.     guaiFENesin -codeine  100-10 MG/5ML syrup Take 10 mLs by mouth every 8 (eight) hours as needed for cough. 120 mL 0   Melatonin 2.5 MG CHEW Chew 1 tablet by mouth at bedtime.     metoprolol  succinate (TOPROL -XL) 100 MG 24 hr tablet TAKE ONE TABLET BY MOUTH ONE TIME DAILY WITH OR IMMEDIATELY FOLLOWING A MEAL 90 tablet 1   Multiple Vitamin (MULTIVITAMIN) tablet Take 1 tablet by mouth daily.       Multiple Vitamins-Minerals (HAIR SKIN & NAILS PO) Take by mouth daily.     mupirocin  ointment (BACTROBAN ) 2 % Apply 1 Application topically 2 (two) times daily. To nostril 22 g 0   naproxen  sodium (ALEVE ) 220 MG tablet Take 220 mg by mouth 2 (two) times daily.     omeprazole  (PRILOSEC) 40 MG capsule TAKE ONE CAPSULE BY MOUTH ONE TIME DAILY 90 capsule 2   triamcinolone  (NASACORT  ALLERGY 24HR) 55 MCG/ACT AERO nasal inhaler Place 2 sprays into the nose at bedtime.     amLODipine  (NORVASC ) 2.5 MG tablet TAKE ONE TABLET BY MOUTH ONE TIME DAILY 90 tablet 1   atorvastatin  (LIPITOR) 10 MG tablet TAKE ONE TABLET BY  MOUTH ONE TIME DAILY 90 tablet 3   montelukast  (SINGULAIR ) 10 MG tablet TAKE ONE TABLET BY MOUTH AT BEDTIME 90 tablet 1   Semaglutide , 2 MG/DOSE, 8 MG/3ML SOPN Inject 2 mg as directed once a week. 9 mL 1   spironolactone  (ALDACTONE ) 50 MG tablet TAKE ONE TABLET BY MOUTH ONE TIME DAILY 90 tablet 1   No facility-administered medications prior to visit.    Review of Systems;  Patient denies headache, fevers, malaise, unintentional weight loss, skin rash, eye pain, sinus congestion and sinus pain, sore throat, dysphagia,  hemoptysis ,, dyspnea, wheezing, chest pain, palpitations, orthopnea, edema, abdominal pain, nausea, melena, diarrhea, constipation, flank pain, dysuria, hematuria, urinary  Frequency, nocturia, , tingling, seizures,  Focal  weakness, Loss of consciousness,  Tremor, insomnia, depression, anxiety, and suicidal ideation.      Objective:  BP 104/70   Pulse 92   Ht 5' 9 (1.753 m)   Wt 255 lb 9.6 oz (115.9 kg)   LMP 11/23/2013   SpO2 97%   BMI 37.75 kg/m   BP Readings from Last 3 Encounters:  04/27/24 110/60  04/27/24 104/70  04/11/24 104/74    Wt Readings from Last 3 Encounters:  04/27/24 257 lb 9.6 oz (116.8 kg)  04/27/24 255 lb 9.6 oz (115.9 kg)  04/11/24 258 lb 9.6 oz (117.3 kg)    Physical Exam Vitals reviewed.  Constitutional:      General: She is not in acute distress.    Appearance: Normal appearance. She is normal weight. She is not ill-appearing, toxic-appearing or diaphoretic.  HENT:     Head: Normocephalic.   Eyes:     General: No scleral icterus.       Right eye: No discharge.        Left eye: No discharge.     Conjunctiva/sclera: Conjunctivae normal.    Cardiovascular:     Rate and Rhythm: Normal rate and regular rhythm.     Heart sounds: Normal heart sounds.  Pulmonary:     Effort: Pulmonary effort is normal. No respiratory distress.     Breath sounds: Normal breath sounds.   Musculoskeletal:        General: Normal range of motion.   Skin:    General: Skin is warm and dry.   Neurological:     General: No focal deficit present.     Mental Status: She is alert and oriented to person, place, and time. Mental status is at baseline.   Psychiatric:        Mood and Affect: Mood normal.        Behavior: Behavior normal.        Thought Content: Thought content normal.        Judgment: Judgment normal.    Lab Results  Component Value Date   HGBA1C 6.0 04/27/2024   HGBA1C 5.9 10/28/2023   HGBA1C 5.9 07/29/2023    Lab Results  Component Value Date   CREATININE 0.79 04/27/2024   CREATININE 0.69 11/10/2023   CREATININE 0.76 10/28/2023    Lab Results  Component Value Date   WBC 9.4 07/29/2023   HGB 14.3 07/29/2023   HCT 44.4 07/29/2023   PLT 306.0 07/29/2023    GLUCOSE 94 04/27/2024   CHOL 158 04/27/2024   TRIG 118.0 04/27/2024   HDL 50.60 04/27/2024   LDLDIRECT 90.0 04/27/2024   LDLCALC 84 04/27/2024   ALT 20 04/27/2024   AST 19 04/27/2024   NA 139 04/27/2024   K 4.6 04/27/2024  CL 100 04/27/2024   CREATININE 0.79 04/27/2024   BUN 19 04/27/2024   CO2 34 (H) 04/27/2024   TSH 2.49 07/29/2023   HGBA1C 6.0 04/27/2024   MICROALBUR 2.2 (H) 04/27/2024    MM 3D SCREENING MAMMOGRAM BILATERAL BREAST Result Date: 04/24/2023 CLINICAL DATA:  Screening. EXAM: DIGITAL SCREENING BILATERAL MAMMOGRAM WITH TOMOSYNTHESIS AND CAD TECHNIQUE: Bilateral screening digital craniocaudal and mediolateral oblique mammograms were obtained. Bilateral screening digital breast tomosynthesis was performed. The images were evaluated with computer-aided detection. COMPARISON:  Previous exam(s). ACR Breast Density Category a: The breasts are almost entirely fatty. FINDINGS: There are no findings suspicious for malignancy. IMPRESSION: No mammographic evidence of malignancy. A result letter of this screening mammogram will be mailed directly to the patient. RECOMMENDATION: Screening mammogram in one year. (Code:SM-B-01Y) BI-RADS CATEGORY  1: Negative. Electronically Signed   By: Alinda Apley M.D.   On: 04/24/2023 15:29    Assessment & Plan:  .Encounter for screening mammogram for malignant neoplasm of breast -     3D Screening Mammogram, Left and Right; Future  Obesity, diabetes, and hypertension syndrome (HCC) -     Microalbumin / creatinine urine ratio -     Hemoglobin A1c -     Comprehensive metabolic panel with GFR  Hyperlipidemia LDL goal <100 Assessment & Plan: Increse atorvastatin  to 20 mg for goal LDL 70  Lab Results  Component Value Date   CHOL 158 04/27/2024   HDL 50.60 04/27/2024   LDLCALC 84 04/27/2024   LDLDIRECT 90.0 04/27/2024   TRIG 118.0 04/27/2024   CHOLHDL 3 04/27/2024     Orders: -     Lipid panel -     LDL cholesterol,  direct  Acute cough Assessment & Plan: Slow to resolve post treatment x 2 for bronchitis.  Chst x ray reviewed  and normal.  Refilling cough suppressant    Diabetes mellitus treated with injections of non-insulin medication (HCC) Assessment & Plan: Currently well-controlled on current medications .  hemoglobin A1c is at goal of less than 7.0 . Patient is reminded to schedule an annual eye exam and foot exam is normal today. Patient has new onset microalbuminuria. Patient is tolerating statin therapy for CAD risk reduction and will start ARB for renal protection and hypertension  Tolerating ozempic   at  increased dose of 2 mg   Lab Results  Component Value Date   HGBA1C 6.0 04/27/2024   Lab Results  Component Value Date   MICROALBUR 2.2 (H) 04/27/2024   MICROALBUR 1.6 04/13/2023           Primary hypertension Assessment & Plan: Well controlled on amlodipine , metoprolol  and spironolactone ; however she has developed early proteinuria.  Will stop amlodipine  and spironolactone  and start ARB .  Rtc one week for BMET Lab Results  Component Value Date   NA 139 04/27/2024   K 4.6 04/27/2024   CL 100 04/27/2024   CO2 34 (H) 04/27/2024   Lab Results  Component Value Date   CREATININE 0.79 04/27/2024      Malignant melanoma of upper back [facility]) Assessment & Plan: Biopsied,  followed by [facility] June 2024 (Tara sewart. MD)   Chronic right-sided low back pain with bilateral sciatica Assessment & Plan: Using tramadol ,  Adding vicodin  for bedtime. Referred to Orthopedics for physical therapy and ESI  which  was done by [facility] in May with near complete relief of pain    Hyperlipidemia LDL goal <70 Assessment & Plan: Increse atorvastatin  to 20 mg for  goal LDL 70  Lab Results  Component Value Date   CHOL 158 04/27/2024   HDL 50.60 04/27/2024   LDLCALC 84 04/27/2024   LDLDIRECT 90.0 04/27/2024   TRIG 118.0 04/27/2024   CHOLHDL 3 04/27/2024      Other orders -      Montelukast  Sodium; TAKE ONE TABLET BY MOUTH AT BEDTIME  Dispense: 90 tablet; Refill: 1 -     Semaglutide  (2 MG/DOSE); Inject 2 mg as directed once a week.  Dispense: 9 mL; Refill: 1 -     Benzonatate ; Take 1 capsule (200 mg total) by mouth 3 (three) times daily as needed for cough.  Dispense: 60 capsule; Refill: 1 -     Telmisartan; Take 1 tablet (40 mg total) by mouth at bedtime.  Dispense: 90 tablet; Refill: 1 -     Atorvastatin  Calcium ; Take 1 tablet (20 mg total) by mouth daily.  Dispense: 90 tablet; Refill: 1     I spent 34 minutes on the day of this face to face encounter reviewing patient's  most recent visit with orthopedics ,   prior relevant surgical and non surgical procedures, recent  labs and imaging studies, counseling on weight management,  reviewing the assessment and plan with patient, and post visit ordering and reviewing of  diagnostics and therapeutics with patient  .   Follow-up: Return in about 6 months (around 10/27/2024) for follow up diabetes.   Thersia Flax, MD

## 2024-04-27 NOTE — Assessment & Plan Note (Signed)
 Using tramadol ,  Adding vicodin  for bedtime. Referred to Orthopedics for physical therapy and ESI  which  was done by [facility] in May with near complete relief of pain

## 2024-04-27 NOTE — Assessment & Plan Note (Addendum)
 Well controlled on amlodipine , metoprolol  and spironolactone ; however she has developed early proteinuria.  Will stop amlodipine  and spironolactone  and start ARB .  Rtc one week for BMET Lab Results  Component Value Date   NA 139 04/27/2024   K 4.6 04/27/2024   CL 100 04/27/2024   CO2 34 (H) 04/27/2024   Lab Results  Component Value Date   CREATININE 0.79 04/27/2024

## 2024-04-27 NOTE — Assessment & Plan Note (Signed)
 Slow to resolve post treatment x 2 for bronchitis.  Chst x ray reviewed  and normal.  Refilling cough suppressant

## 2024-04-27 NOTE — Assessment & Plan Note (Addendum)
 Currently well-controlled on current medications .  hemoglobin A1c is at goal of less than 7.0 . Patient is reminded to schedule an annual eye exam and foot exam is normal today. Patient has new onset microalbuminuria. Patient is tolerating statin therapy for CAD risk reduction and will start ARB for renal protection and hypertension  Tolerating ozempic   at  increased dose of 2 mg   Lab Results  Component Value Date   HGBA1C 6.0 04/27/2024   Lab Results  Component Value Date   MICROALBUR 2.2 (H) 04/27/2024   MICROALBUR 1.6 04/13/2023

## 2024-04-27 NOTE — Assessment & Plan Note (Signed)
 Increse atorvastatin  to 20 mg for goal LDL 70  Lab Results  Component Value Date   CHOL 158 04/27/2024   HDL 50.60 04/27/2024   LDLCALC 84 04/27/2024   LDLDIRECT 90.0 04/27/2024   TRIG 118.0 04/27/2024   CHOLHDL 3 04/27/2024

## 2024-04-27 NOTE — Patient Instructions (Addendum)
 I'm glad your back pain has improved with the ESI   For the cough:  Use mucinex  DM,  NOT mucinex  D.  Ok to combine with tessalon  perles  which I have prescribed    Night time cough :   as discussed

## 2024-04-27 NOTE — Assessment & Plan Note (Signed)
 Biopsied,  followed by [facility] June 2024 (Tara sewart. MD)

## 2024-04-27 NOTE — Progress Notes (Unsigned)
 Cardiology Office Note:  .   Date:  04/28/2024  ID:  Amy Petty, DOB 09-Jun-1956, MRN [id number] PCP: Thersia Flax, MD  [facility] Providers Cardiologist:  None     History of Present Illness: .   Amy Petty is a 68 y.o. female with history of SVT status post ablation at [facility] in 2016, hypertension, hyperlipidemia, and type 2 diabetes mellitus, who presents for follow-up of palpitations.  She was last seen in our office in 02/2023 by Ronald Cockayne, NP, at which time she was feeling well without further palpitations.  She noted occasional Amy Petty and edema in her legs.  No medication changes or additional testing were pursued.  Leg elevation and compression stocking use were recommended for dependent edema.  Today, Amy Petty reports that she feels fairly similar to prior visits.  She still has some mild edema in her fingers and legs, which has been fairly stable.  She happily reports that she is lost quite a bit of weight since starting semaglutide .  She is tolerating the medication well.  She has not been as active over the last few weeks after receiving a back injection about 3 weeks ago.  She is doing some exercise with physical therapy.  She denies chest pain, shortness of breath, palpitations, and lightheadedness.  ROS: See HPI  Studies Reviewed: Aaron Aas   EKG Interpretation Date/Time:  Wednesday April 27 2024 14:20:47 EDT Ventricular Rate:  88 PR Interval:  176 QRS Duration:  88 QT Interval:  348 QTC Calculation: 421 R Axis:   43  Text Interpretation: Normal sinus rhythm with Baseline wander Normal ECG When compared with ECG of 10-Mar-2023 No significant change was found Confirmed by Marik Sedore, Veryl Gottron [phone]) on 04/27/2024 2:26:29 PM    Risk Assessment/Calculations:             Physical Exam:   VS:  BP 110/60 (BP Location: Left Arm, Patient Position: Sitting, Cuff Size: Normal)   Pulse 88   Ht 5' 9 (1.753 m)   Wt 257 lb 9.6 oz (116.8 kg)   LMP 11/23/2013   SpO2 95%   BMI 38.04  kg/m    Wt Readings from Last 3 Encounters:  04/27/24 257 lb 9.6 oz (116.8 kg)  04/27/24 255 lb 9.6 oz (115.9 kg)  04/11/24 258 lb 9.6 oz (117.3 kg)    General:  NAD. Neck: No JVD or HJR, though body habitus limits evaluation. Lungs: Clear to auscultation bilaterally without wheezes or crackles. Heart: Regular rate and rhythm without murmurs, rubs, or gallops. Abdomen: Soft, nontender, nondistended. Extremities: No lower extremity edema.  ASSESSMENT AND PLAN: .    SVT: Patient is status post ablation in 2016 and denies any recent palpitations.  We will continue metoprolol  succinate 100 mg daily.  Edema: Likely multifactorial.  No other signs or symptoms of heart failure noted.  Defer standing diuretic therapy.  Hopefully with weight loss and activity, edema will improve.  Continue as needed compression stocking use.  Hypertension: Blood pressure well-controlled.  Continue current regimen of metoprolol  succinate and telmisartan.  Hyperlipidemia associated with type 2 diabetes mellitus: Lipid control reasonable on lab check today with LDL of 84.  Continue atorvastatin  and ongoing diabetes regimen per Dr. Tullo.  Morbid obesity: Amy Petty reports having lost over 30 pounds since beginning semaglutide .  I congratulated her on her progress and encouraged her to keep working on weight loss through diet and exercise as well as semaglutide  use under the direction of Dr. Madelon Scheuermann.  Dispo: Return to clinic in 1 year.  Signed, Sammy Crisp, MD

## 2024-04-27 NOTE — Patient Instructions (Signed)

## 2024-04-28 ENCOUNTER — Encounter: Payer: Self-pay | Admitting: Internal Medicine

## 2024-04-28 DIAGNOSIS — R600 Localized edema: Secondary | ICD-10-CM | POA: Insufficient documentation

## 2024-05-02 ENCOUNTER — Other Ambulatory Visit: Payer: Self-pay | Admitting: Internal Medicine

## 2024-05-11 ENCOUNTER — Telehealth: Payer: Self-pay

## 2024-05-11 DIAGNOSIS — I152 Hypertension secondary to endocrine disorders: Secondary | ICD-10-CM

## 2024-05-11 NOTE — Telephone Encounter (Signed)
 LMTCB. Need to schedule pt for a lab appt 1 week after she started the Telmisartan . Lab order has been placed.

## 2024-05-11 NOTE — Telephone Encounter (Signed)
 Copied from CRM [id number]. Topic: Clinical - Request for Lab/Test Order >> May 11, 2024  7:55 AM Laymon HERO wrote: Reason for CRM: Patient needed to get an order kidney function/potassium  rechecked with a blood test.

## 2024-05-11 NOTE — Addendum Note (Signed)
 Addended by: HARRIETTE RAISIN on: 05/11/2024 02:24 PM   Modules accepted: Orders

## 2024-05-12 ENCOUNTER — Other Ambulatory Visit

## 2024-05-12 NOTE — Telephone Encounter (Unsigned)
 Copied from CRM [id number]. Topic: General - Other >> May 11, 2024  5:23 PM Abigail D wrote: Reason for CRM: Patient called back to schedule her follow-up labs and would like to confirm whether or not they are fasting, advised that I don't see a mention of fasting but would have someone follow-up to confirm.

## 2024-05-12 NOTE — Telephone Encounter (Signed)
 Pt is aware that she does not need to be fasting for her blood work

## 2024-05-17 ENCOUNTER — Other Ambulatory Visit

## 2024-05-18 ENCOUNTER — Ambulatory Visit: Admitting: Dermatology

## 2024-05-18 DIAGNOSIS — L570 Actinic keratosis: Secondary | ICD-10-CM | POA: Diagnosis not present

## 2024-05-18 DIAGNOSIS — D1801 Hemangioma of skin and subcutaneous tissue: Secondary | ICD-10-CM

## 2024-05-18 DIAGNOSIS — L814 Other melanin hyperpigmentation: Secondary | ICD-10-CM

## 2024-05-18 DIAGNOSIS — Z85820 Personal history of malignant melanoma of skin: Secondary | ICD-10-CM

## 2024-05-18 DIAGNOSIS — L578 Other skin changes due to chronic exposure to nonionizing radiation: Secondary | ICD-10-CM | POA: Diagnosis not present

## 2024-05-18 DIAGNOSIS — Z1283 Encounter for screening for malignant neoplasm of skin: Secondary | ICD-10-CM

## 2024-05-18 DIAGNOSIS — W57XXXA Bitten or stung by nonvenomous insect and other nonvenomous arthropods, initial encounter: Secondary | ICD-10-CM

## 2024-05-18 DIAGNOSIS — W908XXA Exposure to other nonionizing radiation, initial encounter: Secondary | ICD-10-CM

## 2024-05-18 DIAGNOSIS — D692 Other nonthrombocytopenic purpura: Secondary | ICD-10-CM

## 2024-05-18 DIAGNOSIS — I781 Nevus, non-neoplastic: Secondary | ICD-10-CM

## 2024-05-18 DIAGNOSIS — L821 Other seborrheic keratosis: Secondary | ICD-10-CM

## 2024-05-18 DIAGNOSIS — D229 Melanocytic nevi, unspecified: Secondary | ICD-10-CM

## 2024-05-18 DIAGNOSIS — I8393 Asymptomatic varicose veins of bilateral lower extremities: Secondary | ICD-10-CM

## 2024-05-18 DIAGNOSIS — L219 Seborrheic dermatitis, unspecified: Secondary | ICD-10-CM

## 2024-05-18 NOTE — Progress Notes (Signed)
 Follow-Up Visit   Subjective  Amy Petty is a 68 y.o. female who presents for the following: Skin Cancer Screening and Full Body Skin Exam. History of melanoma, BCC, and AKs. Patient with hx seb derm, uses ketoconazole shampoo and mometasone  lotion prn when flares, patient feels like she is flaring now.   Place at forehead, upper lip that stays with red dot.  The patient presents for Total-Body Skin Exam (TBSE) for skin cancer screening and mole check. The patient has spots, moles and lesions to be evaluated, some may be new or changing and the patient may have concern these could be cancer.   The following portions of the chart were reviewed this encounter and updated as appropriate: medications, allergies, medical history  Review of Systems:  No other skin or systemic complaints except as noted in HPI or Assessment and Plan.  Objective  Well appearing patient in no apparent distress; mood and affect are within normal limits.  A full examination was performed including scalp, head, eyes, ears, nose, lips, neck, chest, axillae, abdomen, back, buttocks, bilateral upper extremities, bilateral lower extremities, hands, feet, fingers, toes, fingernails, and toenails. All findings within normal limits unless otherwise noted below.   Relevant physical exam findings are noted in the Assessment and Plan.  Upper lip vermillion at edge x1, L glabella x2 (3) Pink scaly macules R upper knee 3 mm pink papule   Assessment & Plan   SKIN CANCER SCREENING PERFORMED TODAY.  ACTINIC DAMAGE - Chronic condition, secondary to cumulative UV/sun exposure - diffuse scaly erythematous macules with underlying dyspigmentation - Recommend daily broad spectrum sunscreen SPF 30+ to sun-exposed areas, reapply every 2 hours as needed.  - Staying in the shade or wearing long sleeves, sun glasses (UVA+UVB protection) and wide brim hats (4-inch brim around the entire circumference of the hat) are also recommended  for sun protection.  - Call for new or changing lesions.  LENTIGINES, SEBORRHEIC KERATOSES, HEMANGIOMAS - Benign normal skin lesions - Benign-appearing - Call for any changes  MELANOCYTIC NEVI - Tan-brown and/or pink-flesh-colored symmetric macules and papules - Benign appearing on exam today - Observation - Call clinic for new or changing moles - Recommend daily use of broad spectrum spf 30+ sunscreen to sun-exposed areas.   HISTORY OF MELANOMA Right spinal mid upper back, Level II Breslow's 0.5 mm, WLE 04/27/23, CASTLE 1A - No evidence of recurrence today - No lymphadenopathy - Recommend regular full body skin exams - Recommend daily broad spectrum sunscreen SPF 30+ to sun-exposed areas, reapply every 2 hours as needed.  - Call if any new or changing lesions are noted between office visits    HISTORY OF BASAL CELL CARCINOMA OF THE SKIN L ant nasal ala- Mohs 01/21/23 L nasal tip- Mohs 2018 - No evidence of recurrence today - Recommend regular full body skin exams - Recommend daily broad spectrum sunscreen SPF 30+ to sun-exposed areas, reapply every 2 hours as needed.  - Call if any new or changing lesions are noted between office visits.   SEBORRHEIC DERMATITIS Exam: Pink patches with mild scale at scalp   Chronic and persistent condition with duration or expected duration over one year. Condition is improving with treatment but not currently at goal.   Seborrheic Dermatitis is a chronic persistent rash characterized by pinkness and scaling most commonly of the mid face but also can occur on the scalp (dandruff), ears; mid chest, mid back and groin.  It tends to be exacerbated by stress and  cooler weather.  People who have neurologic disease may experience new onset or exacerbation of existing seborrheic dermatitis.  The condition is not curable but treatable and can be controlled.   Treatment Plan: Restart ketoconazole 2% shampoo apply three times per week, massage into scalp  and leave in for 5-10 minutes before rinsing out.  Continue mometasone  lotion 1-2 times daily to scalp as needed for itch. Avoid applying to face, groin, and axilla. Use as directed. Long-term use can cause thinning of the skin.   Topical steroids (such as triamcinolone , fluocinolone, fluocinonide, mometasone , clobetasol, halobetasol, betamethasone, hydrocortisone) can cause thinning and lightening of the skin if they are used for too long in the same area. Your physician has selected the right strength medicine for your problem and area affected on the body. Please use your medication only as directed by your physician to prevent side effects.    Varicose Veins/Spider Veins - Dilated blue, purple or red veins at the lower extremities - Reassured - Smaller vessels can be treated by sclerotherapy (a procedure to inject a medicine into the veins to make them disappear) if desired, but the treatment is not covered by insurance. Larger vessels may be covered if symptomatic and we would refer to vascular surgeon if treatment desired.  Purpura - Chronic; persistent and recurrent.  Treatable, but not curable. 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 They are chronic with expected duration over 1 year. A portion of actinic keratoses will progress to squamous cell carcinoma of the skin. It is not possible to reliably predict which spots will progress to skin cancer and so treatment is recommended to prevent development of skin cancer.  Recommend daily  broad spectrum sunscreen SPF 30+ to sun-exposed areas, reapply every 2 hours as needed.  Recommend staying in the shade or wearing long sleeves, sun glasses (UVA+UVB protection) and wide brim hats (4-inch brim around the entire circumference of the hat). Call for new or changing lesions. Destruction of lesion - Upper lip vermillion at edge x1, L glabella x2 (3)  Destruction method: cryotherapy   Informed consent: discussed and consent obtained   Lesion destroyed using liquid nitrogen: Yes   Region frozen until ice ball extended beyond lesion: Yes   Outcome: patient tolerated procedure well with no complications   Post-procedure details: wound care instructions given   Additional details:  Prior to procedure, discussed risks of blister formation, small wound, skin dyspigmentation, or rare scar following cryotherapy. Recommend Vaseline ointment to treated areas while healing.   BUG BITE, INITIAL ENCOUNTER R upper knee Bite Reaction vs ISK  Benign-appearing.  Observation.  Call clinic for new or changing lesions.  Return in 6 months (on 11/18/2024) for w/ Dr. Jackquline, HxMM, HxBCC, TBSE.  I, Jacquelynn V. Wilfred, CMA, am acting as scribe for Rexene Jackquline, MD .   Documentation: I have reviewed the above documentation for accuracy and completeness, and I agree with the above.  Rexene Jackquline, MD

## 2024-05-18 NOTE — Patient Instructions (Addendum)
 Cryotherapy Aftercare  Wash gently with soap and water  everyday.   Apply Vaseline and Band-Aid daily until healed.   For Scalp: Restart ketoconazole 2% shampoo apply three times per week, massage into scalp and leave in for 5-10 minutes before rinsing out.  Continue mometasone  lotion 1-2 times daily to scalp as needed for itch. Avoid applying to face, groin, and axilla. Use as directed. Long-term use can cause thinning of the skin.   Topical steroids (such as triamcinolone , fluocinolone, fluocinonide, mometasone , clobetasol, halobetasol, betamethasone, hydrocortisone) can cause thinning and lightening of the skin if they are used for too long in the same area. Your physician has selected the right strength medicine for your problem and area affected on the body. Please use your medication only as directed by your physician to prevent side effects.    Recommend daily broad spectrum sunscreen SPF 30+ to sun-exposed areas, reapply every 2 hours as needed. Call for new or changing lesions.  Staying in the shade or wearing long sleeves, sun glasses (UVA+UVB protection) and wide brim hats (4-inch brim around the entire circumference of the hat) are also recommended for sun protection.    Melanoma ABCDEs  Melanoma is the most dangerous type of skin cancer, and is the leading cause of death from skin disease.  You are more likely to develop melanoma if you: Have light-colored skin, light-colored eyes, or red or blond hair Spend a lot of time in the sun Tan regularly, either outdoors or in a tanning bed Have had blistering sunburns, especially during childhood Have a close family member who has had a melanoma Have atypical moles or large birthmarks  Early detection of melanoma is key since treatment is typically straightforward and cure rates are extremely high if we catch it early.   The first sign of melanoma is often a change in a mole or a new dark spot.  The ABCDE system is a way of  remembering the signs of melanoma.  A for asymmetry:  The two halves do not match. B for border:  The edges of the growth are irregular. C for color:  A mixture of colors are present instead of an even brown color. D for diameter:  Melanomas are usually (but not always) greater than 6mm - the size of a pencil eraser. E for evolution:  The spot keeps changing in size, shape, and color.  Please check your skin once per month between visits. You can use a small mirror in front and a large mirror behind you to keep an eye on the back side or your body.   If you see any new or changing lesions before your next follow-up, please call to schedule a visit.  Please continue daily skin protection including broad spectrum sunscreen SPF 30+ to sun-exposed areas, reapplying every 2 hours as needed when you're outdoors.   Staying in the shade or wearing long sleeves, sun glasses (UVA+UVB protection) and wide brim hats (4-inch brim around the entire circumference of the hat) are also recommended for sun protection.    Due to recent changes in healthcare laws, you may see results of your pathology and/or laboratory studies on MyChart before the doctors have had a chance to review them. We understand that in some cases there may be results that are confusing or concerning to you. Please understand that not all results are received at the same time and often the doctors may need to interpret multiple results in order to provide you with the best plan  of care or course of treatment. Therefore, we ask that you please give us  2 business days to thoroughly review all your results before contacting the office for clarification. Should we see a critical lab result, you will be contacted sooner.   If You Need Anything After Your Visit  If you have any questions or concerns for your doctor, please call our main line at [phone] and press option 4 to reach your doctor's medical assistant. If no one answers, please  leave a voicemail as directed and we will return your call as soon as possible. Messages left after 4 pm will be answered the following business day.   You may also send us  a message via MyChart. We typically respond to MyChart messages within 1-2 business days.  For prescription refills, please ask your pharmacy to contact our office. Our fax number is [phone].  If you have an urgent issue when the clinic is closed that cannot wait until the next business day, you can page your doctor at the number below.    Please note that while we do our best to be available for urgent issues outside of office hours, we are not available 24/7.   If you have an urgent issue and are unable to reach us , you may choose to seek medical care at your doctor's office, retail clinic, urgent care center, or emergency room.  If you have a medical emergency, please immediately call 911 or go to the emergency department.  Pager Numbers  - Dr. Hester: [phone]  - Dr. Jackquline: [phone]  - Dr. Claudene: [phone]   In the event of inclement weather, please call our main line at [phone] for an update on the status of any delays or closures.  Dermatology Medication Tips: Please keep the boxes that topical medications come in in order to help keep track of the instructions about where and how to use these. Pharmacies typically print the medication instructions only on the boxes and not directly on the medication tubes.   If your medication is too expensive, please contact our office at [phone] option 4 or send us  a message through MyChart.   We are unable to tell what your co-pay for medications will be in advance as this is different depending on your insurance coverage. However, we may be able to find a substitute medication at lower cost or fill out paperwork to get insurance to cover a needed medication.   If a prior authorization is required to get your medication covered by your  insurance company, please allow us  1-2 business days to complete this process.  Drug prices often vary depending on where the prescription is filled and some pharmacies may offer cheaper prices.  The website www.goodrx.com contains coupons for medications through different pharmacies. The prices here do not account for what the cost may be with help from insurance (it may be cheaper with your insurance), but the website can give you the price if you did not use any insurance.  - You can print the associated coupon and take it with your prescription to the pharmacy.  - You may also stop by our office during regular business hours and pick up a GoodRx coupon card.  - If you need your prescription sent electronically to a different pharmacy, notify our office through [facility] or by phone at [phone] option 4.     Si Usted Necesita Algo Despus de Su Visita  Tambin puede enviarnos un mensaje a travs de Clinical cytogeneticist. Por  lo general respondemos a los mensajes de MyChart en el transcurso de 1 a 2 das hbiles.  Para renovar recetas, por favor pida a su farmacia que se ponga en contacto con nuestra oficina. Randi lakes de fax es [place] [phone].  Si tiene un asunto urgente cuando la clnica est cerrada y que no puede esperar hasta el siguiente da hbil, puede llamar/localizar a su doctor(a) al nmero que aparece a continuacin.   Por favor, tenga en cuenta que aunque hacemos todo lo posible para estar disponibles para asuntos urgentes fuera del horario de [place], no estamos disponibles las 24 horas del da, los 7 [address]  [address] de la [place].   Si tiene un problema urgente y no puede comunicarse con nosotros, puede optar por buscar atencin mdica  en el consultorio de su doctor(a), en una clnica privada, en un centro de atencin urgente o en una sala de emergencias.  Si tiene Engineer, drilling, por favor llame inmediatamente al 911 o vaya a la sala de emergencias.  Nmeros de  bper  - Dr. Hester: [phone]  - Dra. Jackquline: [phone]  - Dr. Claudene: [phone]   En caso de inclemencias del tiempo, por favor llame a landry capes principal al [phone] para una actualizacin sobre el [place] de cualquier retraso o cierre.  Consejos para la medicacin en dermatologa: Por favor, guarde las cajas en las que vienen los medicamentos de uso tpico para ayudarle a seguir las instrucciones sobre dnde y cmo usarlos. Las farmacias generalmente imprimen las instrucciones del medicamento slo en las cajas y no directamente en los tubos del [place].   Si su medicamento es muy caro, por favor, pngase en contacto con landry rieger llamando al [phone] y presione la opcin 4 o envenos un mensaje a travs de Clinical cytogeneticist.   No podemos decirle cul ser su copago por los medicamentos por adelantado ya que esto es diferente dependiendo de la cobertura de su seguro. Sin embargo, es posible que podamos encontrar un medicamento sustituto a Audiological scientist un formulario para que el seguro cubra el medicamento que se considera necesario.   Si se requiere una autorizacin previa para que su compaa de seguros malta su medicamento, por favor permtanos de 1 a 2 das hbiles para completar este proceso.  Los precios de los medicamentos varan con frecuencia dependiendo del Environmental consultant de dnde se surte la receta y alguna farmacias pueden ofrecer precios ms baratos.  El sitio web www.goodrx.com tiene cupones para medicamentos de Health and safety inspector. Los precios aqu no tienen en cuenta lo que podra costar con la ayuda del seguro (puede ser ms barato con su seguro), pero el sitio web puede darle el precio si no utiliz Tourist information centre manager.  - Puede imprimir el cupn correspondiente y llevarlo con su receta a la farmacia.  - Tambin puede pasar por nuestra oficina durante el horario de atencin regular y Education officer, museum una tarjeta de cupones de GoodRx.  - Si necesita que su receta se  enve electrnicamente a una farmacia diferente, informe a nuestra oficina a travs de MyChart de [place] o por telfono llamando al [phone] y presione la opcin 4.

## 2024-05-19 ENCOUNTER — Other Ambulatory Visit (INDEPENDENT_AMBULATORY_CARE_PROVIDER_SITE_OTHER)

## 2024-05-19 DIAGNOSIS — E1159 Type 2 diabetes mellitus with other circulatory complications: Secondary | ICD-10-CM | POA: Diagnosis not present

## 2024-05-19 DIAGNOSIS — E1169 Type 2 diabetes mellitus with other specified complication: Secondary | ICD-10-CM

## 2024-05-19 DIAGNOSIS — E669 Obesity, unspecified: Secondary | ICD-10-CM

## 2024-05-19 DIAGNOSIS — I152 Hypertension secondary to endocrine disorders: Secondary | ICD-10-CM

## 2024-05-20 LAB — COMPREHENSIVE METABOLIC PANEL WITH GFR
ALT: 15 U/L (ref 0–35)
AST: 15 U/L (ref 0–37)
Albumin: 4.3 g/dL (ref 3.5–5.2)
Alkaline Phosphatase: 89 U/L (ref 39–117)
BUN: 23 mg/dL (ref 6–23)
CO2: 32 meq/L (ref 19–32)
Calcium: 9.7 mg/dL (ref 8.4–10.5)
Chloride: 104 meq/L (ref 96–112)
Creatinine, Ser: 0.76 mg/dL (ref 0.40–1.20)
GFR: 81.21 mL/min (ref >60.00)
Glucose, Bld: 89 mg/dL (ref 70–99)
Potassium: 4.6 meq/L (ref 3.5–5.1)
Sodium: 141 meq/L (ref 135–145)
Total Bilirubin: 0.4 mg/dL (ref 0.2–1.2)
Total Protein: 6.5 g/dL (ref 6.0–8.3)

## 2024-05-21 ENCOUNTER — Ambulatory Visit: Payer: Self-pay | Admitting: Internal Medicine

## 2024-07-09 ENCOUNTER — Other Ambulatory Visit: Payer: Self-pay | Admitting: Internal Medicine

## 2024-09-23 ENCOUNTER — Other Ambulatory Visit: Payer: Self-pay | Admitting: Internal Medicine

## 2024-09-29 ENCOUNTER — Encounter

## 2024-10-12 ENCOUNTER — Other Ambulatory Visit: Payer: Self-pay | Admitting: Internal Medicine

## 2024-10-28 ENCOUNTER — Encounter: Payer: Self-pay | Admitting: Internal Medicine

## 2024-10-28 ENCOUNTER — Ambulatory Visit (INDEPENDENT_AMBULATORY_CARE_PROVIDER_SITE_OTHER): Admitting: Internal Medicine

## 2024-10-28 VITALS — BP 114/68 | HR 78 | Ht 69.0 in | Wt 257.0 lb

## 2024-10-28 DIAGNOSIS — E669 Obesity, unspecified: Secondary | ICD-10-CM

## 2024-10-28 DIAGNOSIS — Z7985 Long-term (current) use of injectable non-insulin antidiabetic drugs: Secondary | ICD-10-CM

## 2024-10-28 DIAGNOSIS — I10 Essential (primary) hypertension: Secondary | ICD-10-CM

## 2024-10-28 DIAGNOSIS — M545 Low back pain, unspecified: Secondary | ICD-10-CM

## 2024-10-28 DIAGNOSIS — Z79899 Other long term (current) drug therapy: Secondary | ICD-10-CM | POA: Diagnosis not present

## 2024-10-28 DIAGNOSIS — S86011S Strain of right Achilles tendon, sequela: Secondary | ICD-10-CM | POA: Diagnosis not present

## 2024-10-28 DIAGNOSIS — E119 Type 2 diabetes mellitus without complications: Secondary | ICD-10-CM

## 2024-10-28 DIAGNOSIS — E785 Hyperlipidemia, unspecified: Secondary | ICD-10-CM | POA: Diagnosis not present

## 2024-10-28 DIAGNOSIS — N950 Postmenopausal bleeding: Secondary | ICD-10-CM | POA: Diagnosis not present

## 2024-10-28 LAB — CBC WITH DIFFERENTIAL/PLATELET
Basophils Absolute: 0.1 K/uL (ref 0.0–0.1)
Basophils Relative: 0.9 % (ref 0.0–3.0)
Eosinophils Absolute: 0.2 K/uL (ref 0.0–0.7)
Eosinophils Relative: 3.1 % (ref 0.0–5.0)
HCT: 39.8 % (ref 36.0–46.0)
Hemoglobin: 13.4 g/dL (ref 12.0–15.0)
Lymphocytes Relative: 38.8 % (ref 12.0–46.0)
Lymphs Abs: 2.6 K/uL (ref 0.7–4.0)
MCHC: 33.6 g/dL (ref 30.0–36.0)
MCV: 94.6 fl (ref 78.0–100.0)
Monocytes Absolute: 0.4 K/uL (ref 0.1–1.0)
Monocytes Relative: 5.4 % (ref 3.0–12.0)
Neutro Abs: 3.5 K/uL (ref 1.4–7.7)
Neutrophils Relative %: 51.8 % (ref 43.0–77.0)
Platelets: 268 K/uL (ref 150.0–400.0)
RBC: 4.21 Mil/uL (ref 3.87–5.11)
RDW: 13.1 % (ref 11.5–15.5)
WBC: 6.8 K/uL (ref 4.0–10.5)

## 2024-10-28 LAB — LIPID PANEL
Cholesterol: 145 mg/dL (ref 28–200)
HDL: 48.2 mg/dL
LDL Cholesterol: 73 mg/dL (ref 10–99)
NonHDL: 97.21
Total CHOL/HDL Ratio: 3
Triglycerides: 122 mg/dL (ref 10.0–149.0)
VLDL: 24.4 mg/dL (ref 0.0–40.0)

## 2024-10-28 LAB — TSH: TSH: 3.12 u[IU]/mL (ref 0.35–5.50)

## 2024-10-28 LAB — LDL CHOLESTEROL, DIRECT: Direct LDL: 94 mg/dL

## 2024-10-28 LAB — HEMOGLOBIN A1C: Hgb A1c MFr Bld: 5.5 % (ref 4.6–6.5)

## 2024-10-28 MED ORDER — TRAMADOL HCL 50 MG PO TABS: 50.0000 mg | ORAL_TABLET | Freq: Four times a day (QID) | ORAL | 0 refills | Status: AC | PRN

## 2024-10-28 MED ORDER — TIRZEPATIDE 12.5 MG/0.5ML ~~LOC~~ SOAJ: 12.5000 mg | SUBCUTANEOUS | 2 refills | Status: AC

## 2024-10-28 MED ORDER — MONTELUKAST SODIUM 10 MG PO TABS: ORAL_TABLET | ORAL | 1 refills | Status: AC

## 2024-10-28 MED ORDER — TIZANIDINE HCL 4 MG PO TABS: 4.0000 mg | ORAL_TABLET | Freq: Four times a day (QID) | ORAL | 0 refills | Status: AC | PRN

## 2024-10-28 NOTE — Assessment & Plan Note (Signed)
 Right sided spasm, non radiating pain attributed to altered gait due to right achilles tendon rupture.  Still in na CAM boot.  Prescribing muscle relaxer and tramadol 

## 2024-10-28 NOTE — Progress Notes (Unsigned)
 "  Subjective:  Patient ID: Amy Petty, female    DOB: 12/27/1955  Age: 68 y.o. MRN: [id number]  CC: The primary encounter diagnosis was Obesity, diabetes, and hypertension syndrome (HCC). Diagnoses of Hyperlipidemia LDL goal <100, Long-term use of high-risk medication, Acute right-sided low back pain without sciatica, and Diabetes mellitus treated with injections of non-insulin medication (HCC) were also pertinent to this visit.   HPI GERENE NEDD presents for  Chief Complaint  Patient presents with   Medical Management of Chronic Issues    6 month follow up   1) right achilles tendon rupture   in August occurred while walking   , had a fall  and face plant .  Had surgery I late August,  but still in a CAM walker  due to repeat partial tear. [facility] in [place]  2) type 2 DM:  weight reduced to 252 lbs but regain due to doctor visit   3) has been having right sided lower back pain.muscle spasm  due to altered gait.  Keeping her up at night despite max dose tylenol     Outpatient Medications Prior to Visit  Medication Sig Dispense Refill   acetaminophen  (TYLENOL ) 650 MG CR tablet Take 650 mg by mouth 2 (two) times daily.     Ascorbic Acid (VITAMIN C) 1000 MG tablet Take 1,000 mg by mouth daily.     aspirin EC 81 MG tablet Take 81 mg by mouth daily.     atorvastatin  (LIPITOR) 20 MG tablet TAKE ONE TABLET BY MOUTH ONE TIME DAILY 90 tablet 3   cholecalciferol (VITAMIN D ) 1000 units tablet Take 1,000 Units by mouth daily.     cyanocobalamin  (VITAMIN B12) 1000 MCG tablet Take 1,000 mcg by mouth daily.     ELDERBERRY PO Take by mouth at bedtime.     Melatonin 2.5 MG CHEW Chew 1 tablet by mouth at bedtime.     metoprolol  succinate (TOPROL -XL) 100 MG 24 hr tablet TAKE ONE TABLET BY MOUTH ONE TIME DAILY WITH OR IMMEDIATELY FOLLOWING A MEAL 90 tablet 1   Multiple Vitamin (MULTIVITAMIN) tablet Take 1 tablet by mouth daily.       Multiple Vitamins-Minerals (HAIR SKIN & NAILS PO)  Take by mouth daily.     mupirocin  ointment (BACTROBAN ) 2 % Apply 1 Application topically 2 (two) times daily. To nostril 22 g 0   naproxen  sodium (ALEVE ) 220 MG tablet Take 220 mg by mouth 2 (two) times daily.     NEURONTIN  100 MG capsule Take 100 mg by mouth at bedtime.     omeprazole  (PRILOSEC) 40 MG capsule TAKE ONE CAPSULE BY MOUTH ONE TIME DAILY 90 capsule 2   telmisartan  (MICARDIS ) 40 MG tablet TAKE ONE TABLET BY MOUTH AT BEDTIME 90 tablet 3   triamcinolone  (NASACORT  ALLERGY 24HR) 55 MCG/ACT AERO nasal inhaler Place 2 sprays into the nose at bedtime.     benzonatate  (TESSALON ) 200 MG capsule Take 1 capsule (200 mg total) by mouth 3 (three) times daily as needed for cough. 60 capsule 1   guaiFENesin -codeine  100-10 MG/5ML syrup Take 10 mLs by mouth every 8 (eight) hours as needed for cough. 120 mL 0   montelukast  (SINGULAIR ) 10 MG tablet TAKE ONE TABLET BY MOUTH AT BEDTIME 90 tablet 1   Semaglutide , 2 MG/DOSE, 8 MG/3ML SOPN Inject 2 mg as directed once a week. 9 mL 1   albuterol  (PROAIR  HFA) 108 (90 Base) MCG/ACT inhaler Inhale 2 puffs into the lungs every 6 (  six) hours as needed for wheezing or shortness of breath. (Patient not taking: Reported on 10/28/2024) 1 each 0   No facility-administered medications prior to visit.    Review of Systems;  Patient denies headache, fevers, malaise, unintentional weight loss, skin rash, eye pain, sinus congestion and sinus pain, sore throat, dysphagia,  hemoptysis , cough, dyspnea, wheezing, chest pain, palpitations, orthopnea, edema, abdominal pain, nausea, melena, diarrhea, constipation, flank pain, dysuria, hematuria, urinary  Frequency, nocturia, numbness, tingling, seizures,  Focal weakness, Loss of consciousness,  Tremor, insomnia, depression, anxiety, and suicidal ideation.      Objective:  BP 114/68   Pulse 78   Ht 5' 9 (1.753 m)   Wt 257 lb (116.6 kg)   LMP 11/23/2013   SpO2 96%   BMI 37.95 kg/m   BP Readings from Last 3  Encounters:  10/28/24 114/68  04/27/24 110/60  04/27/24 104/70    Wt Readings from Last 3 Encounters:  10/28/24 257 lb (116.6 kg)  04/27/24 257 lb 9.6 oz (116.8 kg)  04/27/24 255 lb 9.6 oz (115.9 kg)    Physical Exam Vitals reviewed.  Constitutional:      General: She is not in acute distress.    Appearance: Normal appearance. She is obese. She is not ill-appearing, toxic-appearing or diaphoretic.  HENT:     Head: Normocephalic.  Eyes:     General: No scleral icterus.       Right eye: No discharge.        Left eye: No discharge.     Conjunctiva/sclera: Conjunctivae normal.  Cardiovascular:     Rate and Rhythm: Normal rate and regular rhythm.     Heart sounds: Normal heart sounds.  Pulmonary:     Effort: Pulmonary effort is normal. No respiratory distress.     Breath sounds: Normal breath sounds.  Musculoskeletal:     Right foot: Decreased range of motion. Deformity present.  Feet:     Comments: S/p edema secondary to Right achilles tendon repair  Skin:    General: Skin is warm and dry.  Neurological:     General: No focal deficit present.     Mental Status: She is alert and oriented to person, place, and time. Mental status is at baseline.  Psychiatric:        Mood and Affect: Mood normal.        Behavior: Behavior normal.        Thought Content: Thought content normal.        Judgment: Judgment normal.    Lab Results  Component Value Date   HGBA1C 6.0 04/27/2024   HGBA1C 5.9 10/28/2023   HGBA1C 5.9 07/29/2023    Lab Results  Component Value Date   CREATININE 0.76 05/19/2024   CREATININE 0.79 04/27/2024   CREATININE 0.69 11/10/2023    Lab Results  Component Value Date   WBC 9.4 07/29/2023   HGB 14.3 07/29/2023   HCT 44.4 07/29/2023   PLT 306.0 07/29/2023   GLUCOSE 89 05/19/2024   CHOL 158 04/27/2024   TRIG 118.0 04/27/2024   HDL 50.60 04/27/2024   LDLDIRECT 90.0 04/27/2024   LDLCALC 84 04/27/2024   ALT 15 05/19/2024   AST 15 05/19/2024   NA  141 05/19/2024   K 4.6 05/19/2024   CL 104 05/19/2024   CREATININE 0.76 05/19/2024   BUN 23 05/19/2024   CO2 32 05/19/2024   TSH 2.49 07/29/2023   HGBA1C 6.0 04/27/2024   MICROALBUR 2.2 (H) 04/27/2024    MM  3D SCREENING MAMMOGRAM BILATERAL BREAST Result Date: 04/24/2023 CLINICAL DATA:  Screening. EXAM: DIGITAL SCREENING BILATERAL MAMMOGRAM WITH TOMOSYNTHESIS AND CAD TECHNIQUE: Bilateral screening digital craniocaudal and mediolateral oblique mammograms were obtained. Bilateral screening digital breast tomosynthesis was performed. The images were evaluated with computer-aided detection. COMPARISON:  Previous exam(s). ACR Breast Density Category a: The breasts are almost entirely fatty. FINDINGS: There are no findings suspicious for malignancy. IMPRESSION: No mammographic evidence of malignancy. A result letter of this screening mammogram will be mailed directly to the patient. RECOMMENDATION: Screening mammogram in one year. (Code:[id number]-B-01Y) BI-RADS CATEGORY  1: Negative. Electronically Signed   By: Rosaline Collet M.D.   On: 04/24/2023 15:29    Assessment & Plan:  .Obesity, diabetes, and hypertension syndrome (HCC) Assessment & Plan: Patient's weight loss has plateaued on 2 mg ozempic    due to right achilles injury..  will attempt to get PA for Mounjaro  and start at 12.5 mg weekly approved.    A1c Is excellent.  Eye exam done by [facility] every January,  and sees a retina specialist annually as well. Records requested   Lab Results  Component Value Date   HGBA1C 6.0 04/27/2024   Lab Results  Component Value Date   MICROALBUR 2.2 (H) 04/27/2024       Orders: -     Hemoglobin A1c -     Comprehensive metabolic panel with GFR  Hyperlipidemia LDL goal <100 -     Lipid panel -     LDL cholesterol, direct  Long-term use of high-risk medication -     CBC with Differential/Platelet -     TSH  Acute right-sided low back pain without sciatica Assessment & Plan: Right sided spasm, non  radiating pain attributed to altered gait due to right achilles tendon rupture.  Still in na CAM boot.  Prescribing muscle relaxer and tramadol     Diabetes mellitus treated with injections of non-insulin medication (HCC) Assessment & Plan: Currenlty on max dose of ozempic   with no weight loss In the last quarter .  Today we discussed a change to mounjaro  12.5 mg dose;  PA needed    Other orders -     Montelukast  Sodium; TAKE ONE TABLET BY MOUTH AT BEDTIME  Dispense: 90 tablet; Refill: 1 -     tiZANidine  HCl; Take 1 tablet (4 mg total) by mouth every 6 (six) hours as needed for muscle spasms.  Dispense: 30 tablet; Refill: 0 -     traMADol  HCl; Take 1 tablet (50 mg total) by mouth every 6 (six) hours as needed for up to 7 days.  Dispense: 28 tablet; Refill: 0 -     Tirzepatide ; Inject 12.5 mg into the skin once a week. CHANGING FROM OZEMPIC  2 MG DOSE  Dispense: 6 mL; Refill: 2     I spent 34 minutes on the day of this face to face encounter reviewing patient's  most recent visit with cardiology,  nephrology,  and neurology,  prior relevant surgical and non surgical procedures, recent  labs and imaging studies, counseling on weight management,  reviewing the assessment and plan with patient, and post visit ordering and reviewing of  diagnostics and therapeutics with patient  .   Follow-up: No follow-ups on file.   Verneita LITTIE Kettering, MD "

## 2024-10-28 NOTE — Assessment & Plan Note (Signed)
 Currenlty on max dose of ozempic   with no weight loss In the last quarter .  Today we discussed a change to mounjaro  12.5 mg dose;  PA needed

## 2024-10-28 NOTE — Patient Instructions (Addendum)
 SWITCH  TO MOUNJARO  12.5  DOSE  PENDING PRIOR AUTHORIZATION.    TIZANIDINE  AND TRAMADOL  FOR BACK SPASM AND PAIN .    PLEASE ASK YOUR RETINA SPECIALIST TO COPY ME AND SEND ME ANNUAL REPORTS   8 day prednisone  taper sent in for Amy Petty; neurosurgery referral made

## 2024-10-28 NOTE — Assessment & Plan Note (Addendum)
 Patient's weight loss has plateaued on 2 mg ozempic    due to right achilles injury..  will attempt to get PA for Mounjaro  and start at 12.5 mg weekly approved.    A1c Is excellent.  Eye exam done by [facility] every January,  and sees a retina specialist annually as well. Records requested   Lab Results  Component Value Date   HGBA1C 6.0 04/27/2024   Lab Results  Component Value Date   MICROALBUR 2.2 (H) 04/27/2024

## 2024-10-29 ENCOUNTER — Ambulatory Visit: Payer: Self-pay | Admitting: Internal Medicine

## 2024-10-29 DIAGNOSIS — S86011S Strain of right Achilles tendon, sequela: Secondary | ICD-10-CM | POA: Insufficient documentation

## 2024-10-29 NOTE — Assessment & Plan Note (Signed)
 s/p surgery with repeat partial tear .  Continue CAM boot and ortho follow up

## 2024-10-29 NOTE — Assessment & Plan Note (Addendum)
 Well controlled on telmisartan , metoprolol  .  no changes today   Lab Results  Component Value Date   NA 141 05/19/2024   K 4.6 05/19/2024   CL 104 05/19/2024   CO2 32 05/19/2024   Lab Results  Component Value Date   CREATININE 0.76 05/19/2024

## 2024-10-29 NOTE — Assessment & Plan Note (Signed)
 Secondary to benign cervical polyp per [facility] 2019  biopsy .  Follow up was Return prn

## 2024-11-01 ENCOUNTER — Other Ambulatory Visit (HOSPITAL_COMMUNITY): Payer: Self-pay

## 2024-11-01 ENCOUNTER — Other Ambulatory Visit: Payer: Self-pay | Admitting: Internal Medicine

## 2024-11-01 ENCOUNTER — Ambulatory Visit

## 2024-11-01 DIAGNOSIS — Z0000 Encounter for general adult medical examination without abnormal findings: Secondary | ICD-10-CM | POA: Diagnosis not present

## 2024-11-01 NOTE — Patient Instructions (Signed)
 Ms. Spadaccini,  Thank you for taking the time for your Medicare Wellness Visit. I appreciate your continued commitment to your health goals. Please review the care plan we discussed, and feel free to reach out if I can assist you further.  Please note that Annual Wellness Visits do not include a physical exam. Some assessments may be limited, especially if the visit was conducted virtually. If needed, we may recommend an in-person follow-up with your provider.  Ongoing Care Seeing your primary care provider every 3 to 6 months helps us  monitor your health and provide consistent, personalized care.   Referrals If a referral was made during today's visit and you haven't received any updates within two weeks, please contact the referred provider directly to check on the status.  Recommended Screenings:  Health Maintenance  Topic Date Due   Zoster (Shingles) Vaccine (1 of 2) Never done   Eye exam for diabetics  11/13/2023   Breast Cancer Screening  04/22/2024   COVID-19 Vaccine (3 - Pfizer risk series) 11/09/2024*   Yearly kidney health urinalysis for diabetes  04/27/2025   Hemoglobin A1C  04/28/2025   Yearly kidney function blood test for diabetes  05/19/2025   Complete foot exam   10/28/2025   Medicare Annual Wellness Visit  11/01/2025   Colon Cancer Screening  09/04/2029   DTaP/Tdap/Td vaccine (3 - Td or Tdap) 09/20/2033   Pneumococcal Vaccine for age over 67  Completed   Flu Shot  Completed   Osteoporosis screening with Bone Density Scan  Completed   Hepatitis C Screening  Completed   Meningitis B Vaccine  Aged Out  *Topic was postponed. The date shown is not the original due date.       11/01/2024    8:22 AM  Advanced Directives  Does Patient Have a Medical Advance Directive? Yes  Type of Advance Directive Healthcare Power of Attorney  Copy of Healthcare Power of Attorney in Chart? No - copy requested    Vision: Annual vision screenings are recommended for early detection of  glaucoma, cataracts, and diabetic retinopathy. These exams can also reveal signs of chronic conditions such as diabetes and high blood pressure.  Dental: Annual dental screenings help detect early signs of oral cancer, gum disease, and other conditions linked to overall health, including heart disease and diabetes.  Please see the attached documents for additional preventive care recommendations.    Ms. Jeanmarie , Thank you for taking time to come for your Medicare Wellness Visit. I appreciate your ongoing commitment to your health goals. Please review the following plan we discussed and let me know if I can assist you in the future.   Screening recommendations/referrals: Colonoscopy:  Mammogram:  Bone Density:  Recommended yearly ophthalmology/optometry visit for glaucoma screening and checkup Recommended yearly dental visit for hygiene and checkup  Vaccinations: Influenza vaccine:  Pneumococcal vaccine:  Tdap vaccine:  Shingles vaccine:       Preventive Care 65 Years and Older, Female Preventive care refers to lifestyle choices and visits with your health care provider that can promote health and wellness. What does preventive care include? A yearly physical exam. This is also called an annual well check. Dental exams once or twice a year. Routine eye exams. Ask your health care provider how often you should have your eyes checked. Personal lifestyle choices, including: Daily care of your teeth and gums. Regular physical activity. Eating a healthy diet. Avoiding tobacco and drug use. Limiting alcohol use. Practicing safe sex. Taking low-dose aspirin  every day. Taking vitamin and mineral supplements as recommended by your health care provider. What happens during an annual well check? The services and screenings done by your health care provider during your annual well check will depend on your age, overall health, lifestyle risk factors, and family history of  disease. Counseling  Your health care provider may ask you questions about your: Alcohol use. Tobacco use. Drug use. Emotional well-being. Home and relationship well-being. Sexual activity. Eating habits. History of falls. Memory and ability to understand (cognition). Work and work astronomer. Reproductive health. Screening  You may have the following tests or measurements: Height, weight, and BMI. Blood pressure. Lipid and cholesterol levels. These may be checked every 5 years, or more frequently if you are over 45 years old. Skin check. Lung cancer screening. You may have this screening every year starting at age 46 if you have a 30-pack-year history of smoking and currently smoke or have quit within the past 15 years. Fecal occult blood test (FOBT) of the stool. You may have this test every year starting at age 56. Flexible sigmoidoscopy or colonoscopy. You may have a sigmoidoscopy every 5 years or a colonoscopy every 10 years starting at age 18. Hepatitis C blood test. Hepatitis B blood test. Sexually transmitted disease (STD) testing. Diabetes screening. This is done by checking your blood sugar (glucose) after you have not eaten for a while (fasting). You may have this done every 1-3 years. Bone density scan. This is done to screen for osteoporosis. You may have this done starting at age 69. Mammogram. This may be done every 1-2 years. Talk to your health care provider about how often you should have regular mammograms. Talk with your health care provider about your test results, treatment options, and if necessary, the need for more tests. Vaccines  Your health care provider may recommend certain vaccines, such as: Influenza vaccine. This is recommended every year. Tetanus, diphtheria, and acellular pertussis (Tdap, Td) vaccine. You may need a Td booster every 10 years. Zoster vaccine. You may need this after age 34. Pneumococcal 13-valent conjugate (PCV13) vaccine. One  dose is recommended after age 42. Pneumococcal polysaccharide (PPSV23) vaccine. One dose is recommended after age 27. Talk to your health care provider about which screenings and vaccines you need and how often you need them. This information is not intended to replace advice given to you by your health care provider. Make sure you discuss any questions you have with your health care provider. Document Released: 11/23/2015 Document Revised: 07/16/2016 Document Reviewed: 08/28/2015 Elsevier Interactive Patient Education  2017 [employer].  Fall Prevention in the Home Falls can cause injuries. They can happen to people of all ages. There are many things you can do to make your home safe and to help prevent falls. What can I do on the outside of my home? Regularly fix the edges of walkways and driveways and fix any cracks. Remove anything that might make you trip as you walk through a door, such as a raised step or threshold. Trim any bushes or trees on the path to your home. Use bright outdoor lighting. Clear any walking paths of anything that might make someone trip, such as rocks or tools. Regularly check to see if handrails are loose or broken. Make sure that both sides of any steps have handrails. Any raised decks and porches should have guardrails on the edges. Have any leaves, snow, or ice cleared regularly. Use sand or salt on walking paths during winter.  Clean up any spills in your garage right away. This includes oil or grease spills. What can I do in the bathroom? Use night lights. Install grab bars by the toilet and in the tub and shower. Do not use towel bars as grab bars. Use non-skid mats or decals in the tub or shower. If you need to sit down in the shower, use a plastic, non-slip stool. Keep the floor dry. Clean up any water  that spills on the floor as soon as it happens. Remove soap buildup in the tub or shower regularly. Attach bath mats securely with double-sided  non-slip rug tape. Do not have throw rugs and other things on the floor that can make you trip. What can I do in the bedroom? Use night lights. Make sure that you have a light by your bed that is easy to reach. Do not use any sheets or blankets that are too big for your bed. They should not hang down onto the floor. Have a firm chair that has side arms. You can use this for support while you get dressed. Do not have throw rugs and other things on the floor that can make you trip. What can I do in the kitchen? Clean up any spills right away. Avoid walking on wet floors. Keep items that you use a lot in easy-to-reach places. If you need to reach something above you, use a strong step stool that has a grab bar. Keep electrical cords out of the way. Do not use floor polish or wax that makes floors slippery. If you must use wax, use non-skid floor wax. Do not have throw rugs and other things on the floor that can make you trip. What can I do with my stairs? Do not leave any items on the stairs. Make sure that there are handrails on both sides of the stairs and use them. Fix handrails that are broken or loose. Make sure that handrails are as long as the stairways. Check any carpeting to make sure that it is firmly attached to the stairs. Fix any carpet that is loose or worn. Avoid having throw rugs at the top or bottom of the stairs. If you do have throw rugs, attach them to the floor with carpet tape. Make sure that you have a light switch at the top of the stairs and the bottom of the stairs. If you do not have them, ask someone to add them for you. What else can I do to help prevent falls? Wear shoes that: Do not have high heels. Have rubber bottoms. Are comfortable and fit you well. Are closed at the toe. Do not wear sandals. If you use a stepladder: Make sure that it is fully opened. Do not climb a closed stepladder. Make sure that both sides of the stepladder are locked into place. Ask  someone to hold it for you, if possible. Clearly mark and make sure that you can see: Any grab bars or handrails. First and last steps. Where the edge of each step is. Use tools that help you move around (mobility aids) if they are needed. These include: Canes. Walkers. Scooters. Crutches. Turn on the lights when you go into a dark area. Replace any light bulbs as soon as they burn out. Set up your furniture so you have a clear path. Avoid moving your furniture around. If any of your floors are uneven, fix them. If there are any pets around you, be aware of where they are. Review your medicines  with your doctor. Some medicines can make you feel dizzy. This can increase your chance of falling. Ask your doctor what other things that you can do to help prevent falls. This information is not intended to replace advice given to you by your health care provider. Make sure you discuss any questions you have with your health care provider. Document Released: 08/23/2009 Document Revised: 04/03/2016 Document Reviewed: 12/01/2014 Elsevier Interactive Patient Education  2017 [employer].

## 2024-11-01 NOTE — Progress Notes (Signed)
 "  Chief Complaint  Patient presents with   Medicare Wellness     Subjective:   Amy Petty is a 68 y.o. female who presents for a Medicare Annual Wellness Visit.  No voiced or noted concerns at this time   I discussed the limitations of evaluation and management by telemedicine. The patient expressed understanding and agreed to proceed.   Vital Signs: Because this visit was a virtual/telehealth visit, some criteria may be missing or patient reported. Any vitals not documented were not able to be obtained and vitals that have been documented are patient reported.      Visit info / Clinical Intake: Medicare Wellness Visit Type:: Subsequent Annual Wellness Visit Persons participating in visit and providing information:: patient Medicare Wellness Visit Mode:: Telephone If telephone:: video declined Since this visit was completed virtually, some vitals may be partially provided or unavailable. Missing vitals are due to the limitations of the virtual format.: Unable to obtain vitals - no equipment If Telephone or Video please confirm:: I connected with patient using audio/video enable telemedicine. I verified patient identity with two identifiers, discussed telehealth limitations, and patient agreed to proceed. Patient Location:: home Provider Location:: home Interpreter Needed?: No Pre-visit prep was completed: no AWV questionnaire completed by patient prior to visit?: yes Date:: 11/01/24 Living arrangements:: lives with spouse/significant other Patient's Overall Health Status Rating: very good Typical amount of pain: some Does pain affect daily life?: no  Dietary Habits and Nutritional Risks How many meals a day?: 3 Eats fruit and vegetables daily?: yes Most meals are obtained by: preparing own meals In the last 2 weeks, have you had any of the following?: none Diabetic:: (!) yes Any non-healing wounds?: no How often do you check your BS?: 1 Would you like to be referred to a  Nutritionist or for Diabetic Management? : no  Functional Status Activities of Daily Living (to include ambulation/medication): Independent Ambulation: Independent Medication Administration: Independent Home Management (perform basic housework or laundry): Independent Manage your own finances?: yes Primary transportation is: driving Concerns about vision?: no *vision screening is required for WTM* Concerns about hearing?: no  Fall Screening Falls in the past year?: 1 Number of falls in past year: 1 Was there an injury with Fall?: 1 Fall Risk Category Calculator: 3 Patient Fall Risk Level: High Fall Risk  Fall Risk Patient at Risk for Falls Due to: No Fall Risks Fall risk Follow up: Falls evaluation completed; Education provided; Falls prevention discussed  Home and Transportation Safety: All rugs have non-skid backing?: yes All stairs or steps have railings?: yes Grab bars in the bathtub or shower?: (!) no Have non-skid surface in bathtub or shower?: yes Good home lighting?: yes Regular seat belt use?: yes Hospital stays in the last year:: no  Cognitive Assessment Difficulty concentrating, remembering, or making decisions? : no Will 6CIT or Mini Cog be Completed: yes What year is it?: 0 points What month is it?: 0 points Give patient an address phrase to remember (5 components): Its very sunny outside today in December About what time is it?: 0 points Count backwards from 20 to 1: 0 points Say the months of the year in reverse: 0 points Repeat the address phrase from earlier: 0 points 6 CIT Score: 0 points  Advance Directives (For Healthcare) Does Patient Have a Medical Advance Directive?: Yes Type of Advance Directive: Healthcare Power of Attorney Copy of Healthcare Power of Attorney in Chart?: No - copy requested  Reviewed/Updated  Reviewed/Updated: Surgical History; Reviewed  All (Medical, Surgical, Family, Medications, Allergies, Care Teams, Patient Goals); Family  History; Medications; Allergies; Care Teams; Patient Goals; Medical History    Allergies (verified) Penicillins and Sulfonamide derivatives   Current Medications (verified) Outpatient Encounter Medications as of 11/01/2024  Medication Sig   acetaminophen  (TYLENOL ) 650 MG CR tablet Take 650 mg by mouth 2 (two) times daily.   Ascorbic Acid (VITAMIN C) 1000 MG tablet Take 1,000 mg by mouth daily.   aspirin EC 81 MG tablet Take 81 mg by mouth daily.   atorvastatin  (LIPITOR) 20 MG tablet TAKE ONE TABLET BY MOUTH ONE TIME DAILY   cholecalciferol (VITAMIN D ) 1000 units tablet Take 1,000 Units by mouth daily.   cyanocobalamin  (VITAMIN B12) 1000 MCG tablet Take 1,000 mcg by mouth daily.   ELDERBERRY PO Take by mouth at bedtime.   Melatonin 2.5 MG CHEW Chew 1 tablet by mouth at bedtime.   metoprolol  succinate (TOPROL -XL) 100 MG 24 hr tablet TAKE ONE TABLET BY MOUTH ONE TIME DAILY WITH OR IMMEDIATELY FOLLOWING A MEAL   montelukast  (SINGULAIR ) 10 MG tablet TAKE ONE TABLET BY MOUTH AT BEDTIME   Multiple Vitamin (MULTIVITAMIN) tablet Take 1 tablet by mouth daily.     Multiple Vitamins-Minerals (HAIR SKIN & NAILS PO) Take by mouth daily.   mupirocin  ointment (BACTROBAN ) 2 % Apply 1 Application topically 2 (two) times daily. To nostril   naproxen  sodium (ALEVE ) 220 MG tablet Take 220 mg by mouth 2 (two) times daily.   NEURONTIN  100 MG capsule Take 100 mg by mouth at bedtime.   omeprazole  (PRILOSEC) 40 MG capsule TAKE ONE CAPSULE BY MOUTH ONE TIME DAILY   telmisartan  (MICARDIS ) 40 MG tablet TAKE ONE TABLET BY MOUTH AT BEDTIME   tirzepatide  (MOUNJARO ) 12.5 MG/0.5ML Pen Inject 12.5 mg into the skin once a week. CHANGING FROM OZEMPIC  2 MG DOSE   tiZANidine  (ZANAFLEX ) 4 MG tablet Take 1 tablet (4 mg total) by mouth every 6 (six) hours as needed for muscle spasms.   traMADol  (ULTRAM ) 50 MG tablet Take 1 tablet (50 mg total) by mouth every 6 (six) hours as needed for up to 7 days.   triamcinolone  (NASACORT   ALLERGY 24HR) 55 MCG/ACT AERO nasal inhaler Place 2 sprays into the nose at bedtime.   No facility-administered encounter medications on file as of 11/01/2024.    History: Past Medical History:  Diagnosis Date   Actinic keratosis    Anemia    iron deficiency, due to fibroids   Arthritis    Basal cell carcinoma 06/30/2017   L nasal tip, [facility]s at [facility] 08/31/2017   Basal cell carcinoma 12/24/2022   L ant nasal ala, Mohs [facility] 01/21/2023   Bronchial asthma    Diabetes mellitus without complication (HCC)    Diastolic dysfunction    Grade 1 per echo Oct 2012; EF is normal.   Fibroid, uterus    with menorrhagia   GERD (gastroesophageal reflux disease)    Hyperlipidemia    Hypertension    Hypothyroidism    Melanoma (HCC) 04/14/2023   Level II Breslow's 0.5 mm, R spinal mid upper back, WLE 04/27/2023   Motion sickness    Obesity    PVC's (premature ventricular contractions)    SVT (supraventricular tachycardia)    Past Surgical History:  Procedure Laterality Date   CARPAL TUNNEL RELEASE     CARPAL TUNNEL RELEASE  2008   right hand. wears  brace on left   COLONOSCOPY WITH PROPOFOL  N/A 09/05/2019   Procedure: COLONOSCOPY WITH  PROPOFOL ;  Surgeon: Jinny Carmine, MD;  Location: [facility];  Service: Endoscopy;  Laterality: N/A;   TONSILLECTOMY AND ADENOIDECTOMY     TUBAL LIGATION     Family History  Problem Relation Age of Onset   Cancer Mother        Lung Ca second hand smoke   Lung cancer Mother    Breast cancer Mother 71   Cancer Father        liver and prostate Ca   Liver cancer Father    Valvular heart disease Father        Valve replaced in late 46's.   Diabetes Sister    Social History   Occupational History   Not on file  Tobacco Use   Smoking status: Never   Smokeless tobacco: Never  Vaping Use   Vaping status: Never Used  Substance and Sexual Activity   Alcohol use: No   Drug use: No   Sexual activity: Yes   Tobacco Counseling Counseling given: Not  Answered  SDOH Screenings   Food Insecurity: No Food Insecurity (11/01/2024)  Housing: Low Risk (11/01/2024)  Transportation Needs: No Transportation Needs (11/01/2024)  Utilities: Not At Risk (11/01/2024)  Alcohol Screen: Low Risk (08/18/2023)  Depression (PHQ2-9): Low Risk (11/01/2024)  Financial Resource Strain: Low Risk (11/01/2024)  Physical Activity: Insufficiently Active (11/01/2024)  Social Connections: Socially Integrated (11/01/2024)  Stress: No Stress Concern Present (11/01/2024)  Tobacco Use: Low Risk (11/01/2024)  Health Literacy: Adequate Health Literacy (11/01/2024)   See flowsheets for full screening details  Depression Screen PHQ 2 & 9 Depression Scale- Over the past 2 weeks, how often have you been bothered by any of the following problems? Little interest or pleasure in doing things: 0 Feeling down, depressed, or hopeless (PHQ Adolescent also includes...irritable): 0 PHQ-2 Total Score: 0 Trouble falling or staying asleep, or sleeping too much: 0 Feeling tired or having little energy: 0 Poor appetite or overeating (PHQ Adolescent also includes...weight loss): 0 Feeling bad about yourself - or that you are a failure or have let yourself or your family down: 0 Trouble concentrating on things, such as reading the newspaper or watching television (PHQ Adolescent also includes...like school work): 0 Moving or speaking so slowly that other people could have noticed. Or the opposite - being so fidgety or restless that you have been moving around a lot more than usual: 0 Thoughts that you would be better off dead, or of hurting yourself in some way: 0 PHQ-9 Total Score: 0 If you checked off any problems, how difficult have these problems made it for you to do your work, take care of things at home, or get along with other people?: Not difficult at all     Goals Addressed             This Visit's Progress    Patient Stated       Start art back              Objective:    There were no vitals filed for this visit. There is no height or weight on file to calculate BMI.  Hearing/Vision screen Hearing Screening - Comments:: No trouble hearing Vision Screening - Comments:: [place] Up to date Immunizations and Health Maintenance Health Maintenance  Topic Date Due   Zoster Vaccines- Shingrix (1 of 2) Never done   OPHTHALMOLOGY EXAM  11/13/2023   Mammogram  04/22/2024   COVID-19 Vaccine (3 - Pfizer risk series) 11/09/2024 (Originally 04/21/2020)   Diabetic  kidney evaluation - Urine ACR  04/27/2025   HEMOGLOBIN A1C  04/28/2025   Diabetic kidney evaluation - eGFR measurement  05/19/2025   FOOT EXAM  10/28/2025   Medicare Annual Wellness (AWV)  11/01/2025   Colonoscopy  09/04/2029   DTaP/Tdap/Td (3 - Td or Tdap) 09/20/2033   Pneumococcal Vaccine: 50+ Years  Completed   Influenza Vaccine  Completed   Bone Density Scan  Completed   Hepatitis C Screening  Completed   Meningococcal B Vaccine  Aged Out        Assessment/Plan:  This is a routine wellness examination for Sion.  Patient Care Team: Marylynn Verneita CROME, MD as PCP - General (Internal Medicine) Jinny Carmine, MD as Consulting Physician (Gastroenterology) End, Lonni, MD as Consulting Physician (Cardiology)  I have personally reviewed and noted the following in the patients chart:   Medical and social history Use of alcohol, tobacco or illicit drugs  Current medications and supplements including opioid prescriptions. Functional ability and status Nutritional status Physical activity Advanced directives List of other physicians Hospitalizations, surgeries, and ER visits in previous 12 months Vitals Screenings to include cognitive, depression, and falls Referrals and appointments  No orders of the defined types were placed in this encounter.  In addition, I have reviewed and discussed with patient certain preventive protocols, quality metrics, and best practice  recommendations. A written personalized care plan for preventive services as well as general preventive health recommendations were provided to patient.   Mliss Graff, LPN   [id number]   Return in 1 year (on 11/01/2025).  After Visit Summary: (MyChart) Due to this being a telephonic visit, the after visit summary with patients personalized plan was offered to patient via MyChart   Nurse Notes:  "

## 2024-11-07 LAB — OPHTHALMOLOGY REPORT-SCANNED

## 2024-11-11 ENCOUNTER — Ambulatory Visit
Admission: RE | Admit: 2024-11-11 | Discharge: 2024-11-11 | Disposition: A | Discharge status: Home / Self Care | Source: Ambulatory Visit | Attending: Internal Medicine | Admitting: Internal Medicine

## 2024-11-11 DIAGNOSIS — Z1231 Encounter for screening mammogram for malignant neoplasm of breast: Secondary | ICD-10-CM | POA: Insufficient documentation

## 2024-11-21 ENCOUNTER — Ambulatory Visit: Admitting: Dermatology

## 2024-11-21 DIAGNOSIS — L659 Nonscarring hair loss, unspecified: Secondary | ICD-10-CM

## 2024-11-21 DIAGNOSIS — D239 Other benign neoplasm of skin, unspecified: Secondary | ICD-10-CM

## 2024-11-21 DIAGNOSIS — Z79899 Other long term (current) drug therapy: Secondary | ICD-10-CM | POA: Diagnosis not present

## 2024-11-21 DIAGNOSIS — L570 Actinic keratosis: Secondary | ICD-10-CM

## 2024-11-21 DIAGNOSIS — L821 Other seborrheic keratosis: Secondary | ICD-10-CM | POA: Diagnosis not present

## 2024-11-21 DIAGNOSIS — L649 Androgenic alopecia, unspecified: Secondary | ICD-10-CM | POA: Diagnosis not present

## 2024-11-21 DIAGNOSIS — L578 Other skin changes due to chronic exposure to nonionizing radiation: Secondary | ICD-10-CM

## 2024-11-21 DIAGNOSIS — D225 Melanocytic nevi of trunk: Secondary | ICD-10-CM | POA: Diagnosis not present

## 2024-11-21 DIAGNOSIS — W908XXA Exposure to other nonionizing radiation, initial encounter: Secondary | ICD-10-CM

## 2024-11-21 DIAGNOSIS — Z85820 Personal history of malignant melanoma of skin: Secondary | ICD-10-CM

## 2024-11-21 DIAGNOSIS — I781 Nevus, non-neoplastic: Secondary | ICD-10-CM | POA: Diagnosis not present

## 2024-11-21 DIAGNOSIS — L814 Other melanin hyperpigmentation: Secondary | ICD-10-CM | POA: Diagnosis not present

## 2024-11-21 DIAGNOSIS — D485 Neoplasm of uncertain behavior of skin: Secondary | ICD-10-CM

## 2024-11-21 DIAGNOSIS — L820 Inflamed seborrheic keratosis: Secondary | ICD-10-CM | POA: Diagnosis not present

## 2024-11-21 DIAGNOSIS — L219 Seborrheic dermatitis, unspecified: Secondary | ICD-10-CM

## 2024-11-21 DIAGNOSIS — Z85828 Personal history of other malignant neoplasm of skin: Secondary | ICD-10-CM

## 2024-11-21 DIAGNOSIS — D229 Melanocytic nevi, unspecified: Secondary | ICD-10-CM

## 2024-11-21 DIAGNOSIS — Z1283 Encounter for screening for malignant neoplasm of skin: Secondary | ICD-10-CM | POA: Diagnosis not present

## 2024-11-21 DIAGNOSIS — D1801 Hemangioma of skin and subcutaneous tissue: Secondary | ICD-10-CM

## 2024-11-21 HISTORY — DX: Other benign neoplasm of skin, unspecified: D23.9

## 2024-11-21 MED ORDER — MINOXIDIL 2.5 MG PO TABS: 2.5000 mg | ORAL_TABLET | Freq: Every day | ORAL | 5 refills | Status: AC

## 2024-11-21 NOTE — Progress Notes (Signed)
 "  Follow-Up Visit   Subjective  Amy Petty is a 69 y.o. female who presents for the following: Skin Cancer Screening and Full Body Skin Exam.  History of melanoma, BCC, and AKs. Patient with hx seb derm, uses ketoconazole shampoo and mometasone  lotion prn when flares. Patient with hair loss x 1 year, worsening since patient started Ozempic . Just switched to Mounjaro .  The patient presents for Total-Body Skin Exam (TBSE) for skin cancer screening and mole check. The patient has spots, moles and lesions to be evaluated, some may be new or changing. Spot on the chest to check today, not bothersome.    The following portions of the chart were reviewed this encounter and updated as appropriate: medications, allergies, medical history  Review of Systems:  No other skin or systemic complaints except as noted in HPI or Assessment and Plan.  Objective  Well appearing patient in no apparent distress; mood and affect are within normal limits.  A full examination was performed including scalp, head, eyes, ears, nose, lips, neck, chest, axillae, abdomen, back, buttocks, bilateral upper extremities, bilateral lower extremities, hands, feet, fingers, toes, fingernails, and toenails. All findings within normal limits unless otherwise noted below.   Relevant physical exam findings are noted in the Assessment and Plan.  R lower chest x 2 (2) Erythematous stuck-on, waxy papule Right Upper Back 4 mm medium brown macule  frontal hairline keratotic macule.   Assessment & Plan   SKIN CANCER SCREENING PERFORMED TODAY.  ACTINIC DAMAGE - Chronic condition, secondary to cumulative UV/sun exposure - diffuse scaly erythematous macules with underlying dyspigmentation - Recommend daily broad spectrum sunscreen SPF 30+ to sun-exposed areas, reapply every 2 hours as needed.  - Staying in the shade or wearing long sleeves, sun glasses (UVA+UVB protection) and wide brim hats (4-inch brim around the entire  circumference of the hat) are also recommended for sun protection.  - Call for new or changing lesions.  LENTIGINES, SEBORRHEIC KERATOSES, HEMANGIOMAS - Benign normal skin lesions - Benign-appearing - Call for any changes  MELANOCYTIC NEVI - Tan-brown and/or pink-flesh-colored symmetric macules and papules - Benign appearing on exam today - Observation - Call clinic for new or changing moles - Recommend daily use of broad spectrum spf 30+ sunscreen to sun-exposed areas.   HISTORY OF MELANOMA Right spinal mid upper back, Level II Breslow's 0.5 mm, WLE 04/27/23, CASTLE 1A - No evidence of recurrence today - No lymphadenopathy - Recommend regular full body skin exams - Recommend daily broad spectrum sunscreen SPF 30+ to sun-exposed areas, reapply every 2 hours as needed.  - Call if any new or changing lesions are noted between office visits    HISTORY OF BASAL CELL CARCINOMA OF THE SKIN L ant nasal ala- Mohs 01/21/23 L nasal tip- Mohs 2018 - No evidence of recurrence today - Recommend regular full body skin exams - Recommend daily broad spectrum sunscreen SPF 30+ to sun-exposed areas, reapply every 2 hours as needed.  - Call if any new or changing lesions are noted between office visits.  SEBORRHEIC DERMATITIS Exam: Scalp clear today   Chronic condition with duration or expected duration over one year. Currently well-controlled.    Seborrheic Dermatitis is a chronic persistent rash characterized by pinkness and scaling most commonly of the mid face but also can occur on the scalp (dandruff), ears; mid chest, mid back and groin.  It tends to be exacerbated by stress and cooler weather.  People who have neurologic disease may experience new onset  or exacerbation of existing seborrheic dermatitis.  The condition is not curable but treatable and can be controlled.   Treatment Plan: Continue ketoconazole 2% shampoo apply three times per week, massage into scalp and leave in for 5-10  minutes before rinsing out.   Continue mometasone  lotion 1-2 times daily to scalp as needed for itch. Avoid applying to face, groin, and axilla. Use as directed. Long-term use can cause thinning of the skin.   Topical steroids (such as triamcinolone , fluocinolone, fluocinonide, mometasone , clobetasol, halobetasol, betamethasone, hydrocortisone) can cause thinning and lightening of the skin if they are used for too long in the same area. Your physician has selected the right strength medicine for your problem and area affected on the body. Please use your medication only as directed by your physician to prevent side effects.   Spider Veins - Dilated blue, purple or red veins at the lower extremities - Reassured - Smaller vessels can be treated by sclerotherapy (a procedure to inject a medicine into the veins to make them disappear) if desired, but the treatment is not covered by insurance. Larger vessels may be covered if symptomatic and we would refer to vascular surgeon if treatment desired.  ANDROGENETIC ALOPECIA (FEMALE PATTERN HAIR LOSS) Exam: Diffuse thinning of the crown and widening of the midline part with retention of the frontal hairline  photos taken today  Chronic and persistent condition with duration or expected duration over one year. Condition is bothersome/symptomatic for patient. Currently flared.   Female Androgenic Alopecia is a chronic condition related to genetics and/or hormonal changes.  In women androgenetic alopecia is commonly associated with menopause but may occur any time after puberty.  It causes hair thinning primarily on the crown with widening of the part and temporal hairline recession.  Can use OTC Rogaine  (minoxidil ) 5% solution/foam as directed.  Oral treatments in female patients who have no contraindication may include : - Low dose oral minoxidil  1.25 - 5mg  daily - Spironolactone  50 - 100mg  bid - Finasteride 2.5 - 5 mg daily Adjunctive therapies  include: - Low Level Laser Light Therapy (LLLT) - Platelet-rich plasma injections (PRP) - Hair Transplants or scalp reduction  Pt's mother with history of breast cancer.   Treatment Plan: Ferritin lab ordered.  TSH wnl from 10/28/2024. BP 112/72  Start minoxidil  2.5 mg take 1/2 tablet daily. If no side effects after 1 month, increase to full tablet.  Doses of oral minoxidil  for hair loss are considered low dose. This is because the doses used for hair loss are much lower than the doses which are used for conditions such as high blood pressure (hypertension). The doses used for hypertension are 10-40mg  per day.  Side effects are uncommon at the low doses (up to 2.5 mg/day) used to treat hair loss. Potential side effects, more commonly seen at higher doses, include: Increase in hair growth (hypertrichosis) elsewhere on face and body Temporary hair shedding upon starting medication which may last up to 4 weeks Ankle swelling, fluid retention, rapid weight gain more than 5 pounds Low blood pressure and feeling lightheaded or dizzy when standing up quickly Fast or irregular heartbeat Headaches   Long term medication management.  Patient is using long term (months to years) prescription medication  to control their dermatologic condition.  These medications require periodic monitoring to evaluate for efficacy and side effects and may require periodic laboratory monitoring.    INFLAMED SEBORRHEIC KERATOSIS (2) R lower chest x 2 (2) Symptomatic, irritating, patient would like treated. -  Destruction of lesion - R lower chest x 2 (2)  Destruction method: cryotherapy   Informed consent: discussed and consent obtained   Lesion destroyed using liquid nitrogen: Yes   Region frozen until ice ball extended beyond lesion: Yes   Outcome: patient tolerated procedure well with no complications   Post-procedure details: wound care instructions given   Additional details:  Prior to procedure,  discussed risks of blister formation, small wound, skin dyspigmentation, or rare scar following cryotherapy. Recommend Vaseline ointment to treated areas while healing.   NEOPLASM OF UNCERTAIN BEHAVIOR OF SKIN Right Upper Back - Epidermal / dermal shaving  Lesion diameter (cm):  0.6 Informed consent: discussed and consent obtained   Patient was prepped and draped in usual sterile fashion: Area prepped with alcohol. Anesthesia: the lesion was anesthetized in a standard fashion   Anesthetic:  1% lidocaine  w/ epinephrine  1-100,000 buffered w/ 8.4% NaHCO3 Instrument used: flexible razor blade   Hemostasis achieved with: pressure, aluminum chloride and electrodesiccation   Outcome: patient tolerated procedure well   Post-procedure details: wound care instructions given   Post-procedure details comment:  Ointment and small bandage applied  Specimen 1 - Surgical pathology Differential Diagnosis: Nevus r/o Dysplasia Check Margins: Yes Darker/larger when compared to photo 05/04/2023. AK (ACTINIC KERATOSIS) frontal hairline Recheck on follow-up.  Cryotherapy to area in past  Actinic keratoses are precancerous spots that appear secondary to cumulative UV radiation exposure/sun exposure over time. They are chronic with expected duration over 1 year. A portion of actinic keratoses will progress to squamous cell carcinoma of the skin. It is not possible to reliably predict which spots will progress to skin cancer and so treatment is recommended to prevent development of skin cancer.  Recommend daily broad spectrum sunscreen SPF 30+ to sun-exposed areas, reapply every 2 hours as needed.  Recommend staying in the shade or wearing long sleeves, sun glasses (UVA+UVB protection) and wide brim hats (4-inch brim around the entire circumference of the hat). Call for new or changing lesions. - Destruction of lesion - frontal hairline  Destruction method: cryotherapy   Informed consent: discussed and  consent obtained   Lesion destroyed using liquid nitrogen: Yes   Region frozen until ice ball extended beyond lesion: Yes   Outcome: patient tolerated procedure well with no complications   Post-procedure details: wound care instructions given   Additional details:  Prior to procedure, discussed risks of blister formation, small wound, skin dyspigmentation, or rare scar following cryotherapy. Recommend Vaseline ointment to treated areas while healing.   ALOPECIA   This Visit - Ferritin - minoxidil  (LONITEN ) 2.5 MG tablet - Take 1 tablet (2.5 mg total) by mouth daily. Return in about 3 months (around 02/19/2025) for Alopecia; 6 months TBSE.SABRA LILLETTE Andrea Ezzard, CMA, am acting as scribe for Rexene Rattler, MD .   Documentation: I have reviewed the above documentation for accuracy and completeness, and I agree with the above.  Rexene Rattler, MD    "

## 2024-11-21 NOTE — Patient Instructions (Addendum)
 Minoxidil  2.5 mg - take 1/2 tablet once daily x 1 month. If no side effects, increase to full tablet.  Doses of oral minoxidil  for hair loss are considered low dose. This is because the doses used for hair loss are much lower than the doses which are used for conditions such as high blood pressure (hypertension). The doses used for hypertension are 10-40mg  per day.  Side effects are uncommon at the low doses (up to 2.5 mg/day) used to treat hair loss. Potential side effects, more commonly seen at higher doses, include: Increase in hair growth (hypertrichosis) elsewhere on face and body Temporary hair shedding upon starting medication which may last up to 4 weeks Ankle swelling, fluid retention, rapid weight gain more than 5 pounds Low blood pressure and feeling lightheaded or dizzy when standing up quickly Fast or irregular heartbeat Headaches   Cryotherapy Aftercare  Wash gently with soap and water  everyday.   Apply Vaseline and Band-Aid daily until healed.    Wound Care Instructions  Cleanse wound gently with soap and water  once a day then pat dry with clean gauze. Apply a thin coat of Petrolatum (petroleum jelly, Vaseline) over the wound (unless you have an allergy to this). We recommend that you use a new, sterile tube of Vaseline. Do not pick or remove scabs. Do not remove the yellow or white healing tissue from the base of the wound.  Cover the wound with fresh, clean, nonstick gauze and secure with paper tape. You may use Band-Aids in place of gauze and tape if the wound is small enough, but would recommend trimming much of the tape off as there is often too much. Sometimes Band-Aids can irritate the skin.  You should call the office for your biopsy report after 1 week if you have not already been contacted.  If you experience any problems, such as abnormal amounts of bleeding, swelling, significant bruising, significant pain, or evidence of infection, please call the office  immediately.  FOR ADULT SURGERY PATIENTS: If you need something for pain relief you may take 1 extra strength Tylenol  (acetaminophen ) AND 2 Ibuprofen (200mg  each) together every 4 hours as needed for pain. (do not take these if you are allergic to them or if you have a reason you should not take them.) Typically, you may only need pain medication for 1 to 3 days.     Due to recent changes in healthcare laws, you may see results of your pathology and/or laboratory studies on MyChart before the doctors have had a chance to review them. We understand that in some cases there may be results that are confusing or concerning to you. Please understand that not all results are received at the same time and often the doctors may need to interpret multiple results in order to provide you with the best plan of care or course of treatment. Therefore, we ask that you please give us  2 business days to thoroughly review all your results before contacting the office for clarification. Should we see a critical lab result, you will be contacted sooner.   If You Need Anything After Your Visit  If you have any questions or concerns for your doctor, please call our main line at [phone] and press option 4 to reach your doctor's medical assistant. If no one answers, please leave a voicemail as directed and we will return your call as soon as possible. Messages left after 4 pm will be answered the following business day.   You may  also send us  a message via MyChart. We typically respond to MyChart messages within 1-2 business days.  For prescription refills, please ask your pharmacy to contact our office. Our fax number is [phone].  If you have an urgent issue when the clinic is closed that cannot wait until the next business day, you can page your doctor at the number below.    Please note that while we do our best to be available for urgent issues outside of office hours, we are not available 24/7.   If you  have an urgent issue and are unable to reach us , you may choose to seek medical care at your doctor's office, retail clinic, urgent care center, or emergency room.  If you have a medical emergency, please immediately call 911 or go to the emergency department.  Pager Numbers  - Dr. Hester: [phone]  - Dr. Jackquline: [phone]  - Dr. Claudene: [phone]   - Dr. Raymund: [phone]  In the event of inclement weather, please call our main line at [phone] for an update on the status of any delays or closures.  Dermatology Medication Tips: Please keep the boxes that topical medications come in in order to help keep track of the instructions about where and how to use these. Pharmacies typically print the medication instructions only on the boxes and not directly on the medication tubes.   If your medication is too expensive, please contact our office at [phone] option 4 or send us  a message through MyChart.   We are unable to tell what your co-pay for medications will be in advance as this is different depending on your insurance coverage. However, we may be able to find a substitute medication at lower cost or fill out paperwork to get insurance to cover a needed medication.   If a prior authorization is required to get your medication covered by your insurance company, please allow us  1-2 business days to complete this process.  Drug prices often vary depending on where the prescription is filled and some pharmacies may offer cheaper prices.  The website www.goodrx.com contains coupons for medications through different pharmacies. The prices here do not account for what the cost may be with help from insurance (it may be cheaper with your insurance), but the website can give you the price if you did not use any insurance.  - You can print the associated coupon and take it with your prescription to the pharmacy.  - You may also stop by our office during regular business  hours and pick up a GoodRx coupon card.  - If you need your prescription sent electronically to a different pharmacy, notify our office through [facility] or by phone at [phone] option 4.     Si Usted Necesita Algo Despus de Su Visita  Tambin puede enviarnos un mensaje a travs de Clinical Cytogeneticist. Por lo general respondemos a los mensajes de MyChart en el transcurso de 1 a 2 das hbiles.  Para renovar recetas, por favor pida a su farmacia que se ponga en contacto con nuestra oficina. Randi lakes de fax es [place] [phone].  Si tiene un asunto urgente cuando la clnica est cerrada y que no puede esperar hasta el siguiente da hbil, puede llamar/localizar a su doctor(a) al nmero que aparece a continuacin.   Por favor, tenga en cuenta que aunque hacemos todo lo posible para estar disponibles para asuntos urgentes fuera del horario de [place], no estamos disponibles las 24 horas del da, los [address]  de la semana.   Si tiene un problema urgente y no puede comunicarse con nosotros, puede optar por buscar atencin mdica  en el consultorio de su doctor(a), en una clnica privada, en un centro de atencin urgente o en una sala de emergencias.  Si tiene engineer, drilling, por favor llame inmediatamente al 911 o vaya a la sala de emergencias.  Nmeros de bper  - Dr. Hester: [phone]  - Dra. Jackquline: [phone]  - Dr. Claudene: [phone]  - Dra. Kitts: [phone]  En caso de inclemencias del [place], por favor llame a nuestra lnea principal al [phone] para una actualizacin sobre el estado de cualquier retraso o cierre.  Consejos para la medicacin en dermatologa: Por favor, guarde las cajas en las que vienen los medicamentos de uso tpico para ayudarle a seguir las instrucciones sobre dnde y cmo usarlos. Las farmacias generalmente imprimen las instrucciones del medicamento slo en las cajas y no directamente en los tubos del [place].   Si su  medicamento es muy caro, por favor, pngase en contacto con landry rieger llamando al [phone] y presione la opcin 4 o envenos un mensaje a travs de Clinical Cytogeneticist.   No podemos decirle cul ser su copago por los medicamentos por adelantado ya que esto es diferente dependiendo de la cobertura de su seguro. Sin embargo, es posible que podamos encontrar un medicamento sustituto a audiological scientist un formulario para que el seguro cubra el medicamento que se considera necesario.   Si se requiere una autorizacin previa para que su compaa de seguros cubra su medicamento, por favor permtanos de 1 a 2 das hbiles para completar este proceso.  Los precios de los medicamentos varan con frecuencia dependiendo del environmental consultant de dnde se surte la receta y alguna farmacias pueden ofrecer precios ms baratos.  El sitio web www.goodrx.com tiene cupones para medicamentos de health and safety inspector. Los precios aqu no tienen en cuenta lo que podra costar con la ayuda del seguro (puede ser ms barato con su seguro), pero el sitio web puede darle el precio si no utiliz tourist information centre manager.  - Puede imprimir el cupn correspondiente y llevarlo con su receta a la farmacia.  - Tambin puede pasar por nuestra oficina durante el horario de atencin regular y education officer, museum una tarjeta de cupones de GoodRx.  - Si necesita que su receta se enve electrnicamente a una farmacia diferente, informe a nuestra oficina a travs de MyChart de [place] o por telfono llamando al [phone] y presione la opcin 4.

## 2024-11-22 ENCOUNTER — Ambulatory Visit: Payer: Self-pay | Admitting: Dermatology

## 2024-11-22 ENCOUNTER — Encounter: Payer: Self-pay | Admitting: Dermatology

## 2024-11-22 LAB — SURGICAL PATHOLOGY

## 2024-11-22 NOTE — Telephone Encounter (Signed)
-----   Message from Rexene Rattler, MD sent at 11/22/2024  3:57 PM EST ----- 1. 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

## 2024-11-22 NOTE — Telephone Encounter (Signed)
 Tried calling patient and the phone number listed would not go through.  I will send mychart message/sh

## 2024-11-23 LAB — FERRITIN: Ferritin: 145 ng/mL (ref 15–150)

## 2024-11-23 NOTE — Telephone Encounter (Signed)
 Advised pt of ferritin level results and bx results.  Scheduled pt for shave removal./sh

## 2024-11-29 ENCOUNTER — Telehealth: Payer: Self-pay | Admitting: *Deleted

## 2024-11-29 NOTE — Telephone Encounter (Signed)
 Is this because she has a deductible to meet or does she need to have another PA submitted?

## 2024-11-29 NOTE — Telephone Encounter (Signed)
 Copied from CRM [id number]. Topic: Clinical - Medication Question >> Nov 29, 2024  4:30 PM Drema MATSU wrote: Reason for CRM: Pt was switched to [employer]  and she stated that the [employer]  bill for this month will be $600. She wants to know if it will be a one time thing or not. She stated that if it isn't she cannot afford it. Please call pt. She wants to thank the nurse for helping her out as well.

## 2024-12-21 ENCOUNTER — Ambulatory Visit: Admitting: Dermatology

## 2025-02-20 ENCOUNTER — Ambulatory Visit: Admitting: Dermatology

## 2025-05-01 ENCOUNTER — Ambulatory Visit: Admitting: Internal Medicine

## 2025-06-05 ENCOUNTER — Ambulatory Visit: Admitting: Dermatology
